# Patient Record
Sex: Male | Born: 1935 | Race: White | Hispanic: No | Marital: Married | Smoking: Former smoker
Health system: Southern US, Community
[De-identification: ages and names within clinical notes are randomized; demographics above are authoritative.]

## PROBLEM LIST (undated history)

## (undated) DIAGNOSIS — R0600 Dyspnea, unspecified: Secondary | ICD-10-CM

## (undated) DIAGNOSIS — L719 Rosacea, unspecified: Secondary | ICD-10-CM

## (undated) DIAGNOSIS — R0609 Other forms of dyspnea: Secondary | ICD-10-CM

## (undated) DIAGNOSIS — E785 Hyperlipidemia, unspecified: Secondary | ICD-10-CM

## (undated) DIAGNOSIS — Z86010 Personal history of colonic polyps: Secondary | ICD-10-CM

## (undated) DIAGNOSIS — N320 Bladder-neck obstruction: Secondary | ICD-10-CM

## (undated) DIAGNOSIS — Z972 Presence of dental prosthetic device (complete) (partial): Secondary | ICD-10-CM

## (undated) DIAGNOSIS — N400 Enlarged prostate without lower urinary tract symptoms: Secondary | ICD-10-CM

## (undated) DIAGNOSIS — I4819 Other persistent atrial fibrillation: Secondary | ICD-10-CM

## (undated) DIAGNOSIS — Z87898 Personal history of other specified conditions: Secondary | ICD-10-CM

## (undated) DIAGNOSIS — Z8719 Personal history of other diseases of the digestive system: Secondary | ICD-10-CM

## (undated) DIAGNOSIS — K5730 Diverticulosis of large intestine without perforation or abscess without bleeding: Secondary | ICD-10-CM

## (undated) DIAGNOSIS — I5032 Chronic diastolic (congestive) heart failure: Secondary | ICD-10-CM

## (undated) DIAGNOSIS — M1990 Unspecified osteoarthritis, unspecified site: Secondary | ICD-10-CM

## (undated) DIAGNOSIS — I10 Essential (primary) hypertension: Secondary | ICD-10-CM

## (undated) DIAGNOSIS — K219 Gastro-esophageal reflux disease without esophagitis: Secondary | ICD-10-CM

## (undated) DIAGNOSIS — R7301 Impaired fasting glucose: Secondary | ICD-10-CM

## (undated) DIAGNOSIS — Z973 Presence of spectacles and contact lenses: Secondary | ICD-10-CM

## (undated) DIAGNOSIS — L570 Actinic keratosis: Secondary | ICD-10-CM

## (undated) DIAGNOSIS — H3530 Unspecified macular degeneration: Secondary | ICD-10-CM

## (undated) DIAGNOSIS — K5792 Diverticulitis of intestine, part unspecified, without perforation or abscess without bleeding: Secondary | ICD-10-CM

## (undated) DIAGNOSIS — K222 Esophageal obstruction: Secondary | ICD-10-CM

## (undated) DIAGNOSIS — R918 Other nonspecific abnormal finding of lung field: Secondary | ICD-10-CM

## (undated) DIAGNOSIS — Z9889 Other specified postprocedural states: Secondary | ICD-10-CM

## (undated) DIAGNOSIS — N401 Enlarged prostate with lower urinary tract symptoms: Secondary | ICD-10-CM

## (undated) DIAGNOSIS — R42 Dizziness and giddiness: Secondary | ICD-10-CM

## (undated) DIAGNOSIS — N529 Male erectile dysfunction, unspecified: Secondary | ICD-10-CM

## (undated) DIAGNOSIS — C61 Malignant neoplasm of prostate: Secondary | ICD-10-CM

## (undated) DIAGNOSIS — Z85828 Personal history of other malignant neoplasm of skin: Secondary | ICD-10-CM

## (undated) HISTORY — DX: Esophageal obstruction: K22.2

## (undated) HISTORY — DX: Chronic diastolic (congestive) heart failure: I50.32

## (undated) HISTORY — DX: Diverticulitis of intestine, part unspecified, without perforation or abscess without bleeding: K57.92

## (undated) HISTORY — PX: NM MYOVIEW LTD: HXRAD82

## (undated) HISTORY — DX: Unspecified osteoarthritis, unspecified site: M19.90

## (undated) HISTORY — PX: COLONOSCOPY: SHX174

## (undated) HISTORY — DX: Hyperlipidemia, unspecified: E78.5

## (undated) HISTORY — DX: Essential (primary) hypertension: I10

## (undated) HISTORY — PX: ESOPHAGOGASTRODUODENOSCOPY: SHX1529

## (undated) HISTORY — DX: Benign prostatic hyperplasia without lower urinary tract symptoms: N40.0

## (undated) HISTORY — DX: Male erectile dysfunction, unspecified: N52.9

## (undated) HISTORY — DX: Other persistent atrial fibrillation: I48.19

## (undated) HISTORY — DX: Rosacea, unspecified: L71.9

## (undated) HISTORY — DX: Actinic keratosis: L57.0

## (undated) HISTORY — PX: CATARACT EXTRACTION W/ INTRAOCULAR LENS  IMPLANT, BILATERAL: SHX1307

## (undated) HISTORY — DX: Impaired fasting glucose: R73.01

## (undated) HISTORY — PX: REFRACTIVE SURGERY: SHX103

## (undated) HISTORY — DX: Personal history of colonic polyps: Z86.010

---
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
## 1982-03-27 HISTORY — PX: PATELLA FRACTURE SURGERY: SHX735

## 2004-05-17 ENCOUNTER — Ambulatory Visit: Payer: Self-pay | Admitting: Internal Medicine

## 2004-11-17 ENCOUNTER — Ambulatory Visit: Payer: Self-pay | Admitting: Internal Medicine

## 2004-11-22 ENCOUNTER — Ambulatory Visit: Payer: Self-pay

## 2005-01-26 ENCOUNTER — Ambulatory Visit: Payer: Self-pay | Admitting: Internal Medicine

## 2005-05-11 ENCOUNTER — Ambulatory Visit: Payer: Self-pay | Admitting: Internal Medicine

## 2005-05-22 ENCOUNTER — Ambulatory Visit: Payer: Self-pay | Admitting: Internal Medicine

## 2005-11-20 ENCOUNTER — Ambulatory Visit: Payer: Self-pay | Admitting: Internal Medicine

## 2006-02-06 ENCOUNTER — Ambulatory Visit: Payer: Self-pay | Admitting: Internal Medicine

## 2006-05-23 ENCOUNTER — Ambulatory Visit: Payer: Self-pay | Admitting: Internal Medicine

## 2006-05-23 LAB — CONVERTED CEMR LAB
ALT: 21 units/L (ref 0–40)
BUN: 23 mg/dL (ref 6–23)
Calcium: 9.6 mg/dL (ref 8.4–10.5)
Cholesterol: 197 mg/dL (ref 0–200)
GFR calc Af Amer: 96 mL/min
LDL Cholesterol: 122 mg/dL — ABNORMAL HIGH (ref 0–99)
Potassium: 5.2 meq/L — ABNORMAL HIGH (ref 3.5–5.1)
VLDL: 26 mg/dL (ref 0–40)

## 2006-11-12 DIAGNOSIS — F528 Other sexual dysfunction not due to a substance or known physiological condition: Secondary | ICD-10-CM | POA: Insufficient documentation

## 2006-11-12 DIAGNOSIS — N400 Enlarged prostate without lower urinary tract symptoms: Secondary | ICD-10-CM | POA: Insufficient documentation

## 2006-11-12 DIAGNOSIS — K5730 Diverticulosis of large intestine without perforation or abscess without bleeding: Secondary | ICD-10-CM | POA: Insufficient documentation

## 2006-11-12 DIAGNOSIS — I10 Essential (primary) hypertension: Secondary | ICD-10-CM | POA: Insufficient documentation

## 2006-11-12 DIAGNOSIS — E785 Hyperlipidemia, unspecified: Secondary | ICD-10-CM | POA: Insufficient documentation

## 2006-11-16 DIAGNOSIS — K222 Esophageal obstruction: Secondary | ICD-10-CM | POA: Insufficient documentation

## 2006-11-22 ENCOUNTER — Ambulatory Visit: Payer: Self-pay | Admitting: Internal Medicine

## 2006-11-22 DIAGNOSIS — M21619 Bunion of unspecified foot: Secondary | ICD-10-CM | POA: Insufficient documentation

## 2006-12-03 LAB — CONVERTED CEMR LAB
CO2: 31 meq/L (ref 19–32)
Calcium: 9.9 mg/dL (ref 8.4–10.5)
Chloride: 102 meq/L (ref 96–112)
Cholesterol: 232 mg/dL (ref 0–200)
Direct LDL: 146 mg/dL
Glucose, Bld: 102 mg/dL — ABNORMAL HIGH (ref 70–99)
PSA: 1.19 ng/mL (ref 0.10–4.00)

## 2007-01-18 ENCOUNTER — Ambulatory Visit: Payer: Self-pay | Admitting: Internal Medicine

## 2007-05-01 ENCOUNTER — Ambulatory Visit: Payer: Self-pay | Admitting: Internal Medicine

## 2007-05-02 LAB — CONVERTED CEMR LAB
ALT: 25 units/L (ref 0–53)
BUN: 23 mg/dL (ref 6–23)
CO2: 31 meq/L (ref 19–32)
Creatinine, Ser: 1.1 mg/dL (ref 0.4–1.5)
HDL: 45.9 mg/dL (ref >39.0)
Phosphorus: 4 mg/dL (ref 2.3–4.6)
Sodium: 140 meq/L (ref 135–145)
Triglycerides: 148 mg/dL (ref 0–149)

## 2007-05-20 ENCOUNTER — Ambulatory Visit: Payer: Self-pay | Admitting: Internal Medicine

## 2007-05-23 ENCOUNTER — Encounter: Payer: Self-pay | Admitting: Internal Medicine

## 2007-05-23 ENCOUNTER — Ambulatory Visit: Payer: Self-pay | Admitting: Internal Medicine

## 2007-05-23 DIAGNOSIS — Z860100 Personal history of colon polyps, unspecified: Secondary | ICD-10-CM

## 2007-05-23 HISTORY — DX: Personal history of colonic polyps: Z86.010

## 2007-05-23 HISTORY — DX: Personal history of colon polyps, unspecified: Z86.0100

## 2007-10-29 ENCOUNTER — Ambulatory Visit: Payer: Self-pay | Admitting: Internal Medicine

## 2007-10-29 DIAGNOSIS — M1990 Unspecified osteoarthritis, unspecified site: Secondary | ICD-10-CM | POA: Insufficient documentation

## 2007-10-29 DIAGNOSIS — R7301 Impaired fasting glucose: Secondary | ICD-10-CM | POA: Insufficient documentation

## 2007-10-30 LAB — CONVERTED CEMR LAB
ALT: 23 units/L (ref 0–53)
BUN: 24 mg/dL — ABNORMAL HIGH (ref 6–23)
Basophils Absolute: 0 10*3/uL (ref 0.0–0.1)
Bilirubin, Direct: 0.1 mg/dL (ref 0.0–0.3)
CO2: 30 meq/L (ref 19–32)
Chloride: 105 meq/L (ref 96–112)
Cholesterol: 179 mg/dL (ref 0–200)
Eosinophils Absolute: 0.1 10*3/uL (ref 0.0–0.7)
Glucose, Bld: 110 mg/dL — ABNORMAL HIGH (ref 70–99)
HCT: 42.8 % (ref 39.0–52.0)
HDL: 44.9 mg/dL (ref >39.0)
Hgb A1c MFr Bld: 5.7 % (ref 4.6–6.0)
MCHC: 34.7 g/dL (ref 30.0–36.0)
Monocytes Absolute: 0.4 10*3/uL (ref 0.1–1.0)
Monocytes Relative: 6.7 % (ref 3.0–12.0)
PSA: 0.98 ng/mL (ref 0.10–4.00)
Platelets: 197 10*3/uL (ref 150–400)
Potassium: 5.2 meq/L — ABNORMAL HIGH (ref 3.5–5.1)
RDW: 12.5 % (ref 11.5–14.6)
Sodium: 141 meq/L (ref 135–145)
TSH: 1.7 microintl units/mL (ref 0.35–5.50)
Total Bilirubin: 1.1 mg/dL (ref 0.3–1.2)
Total CHOL/HDL Ratio: 4
Triglycerides: 96 mg/dL (ref 0–149)

## 2007-12-30 ENCOUNTER — Telehealth (INDEPENDENT_AMBULATORY_CARE_PROVIDER_SITE_OTHER): Payer: Self-pay | Admitting: *Deleted

## 2008-05-08 ENCOUNTER — Ambulatory Visit: Payer: Self-pay | Admitting: Internal Medicine

## 2008-05-11 LAB — CONVERTED CEMR LAB
ALT: 27 units/L (ref 0–53)
AST: 25 units/L (ref 0–37)
Albumin: 4.1 g/dL (ref 3.5–5.2)
Alkaline Phosphatase: 41 units/L (ref 39–117)
CO2: 30 meq/L (ref 19–32)
Calcium: 9.8 mg/dL (ref 8.4–10.5)
Creatinine, Ser: 1 mg/dL (ref 0.4–1.5)
GFR calc non Af Amer: 79 mL/min
Glucose, Bld: 108 mg/dL — ABNORMAL HIGH (ref 70–99)
HDL: 49.1 mg/dL (ref >39.0)
Phosphorus: 4.1 mg/dL (ref 2.3–4.6)
Sodium: 141 meq/L (ref 135–145)
Triglycerides: 70 mg/dL (ref 0–149)

## 2008-08-17 ENCOUNTER — Telehealth: Payer: Self-pay | Admitting: Internal Medicine

## 2008-11-10 ENCOUNTER — Ambulatory Visit: Payer: Self-pay | Admitting: Internal Medicine

## 2008-11-11 LAB — CONVERTED CEMR LAB
Alkaline Phosphatase: 42 units/L (ref 39–117)
BUN: 21 mg/dL (ref 6–23)
Basophils Absolute: 0 10*3/uL (ref 0.0–0.1)
Bilirubin, Direct: 0.2 mg/dL (ref 0.0–0.3)
Calcium: 9.2 mg/dL (ref 8.4–10.5)
Chloride: 109 meq/L (ref 96–112)
Cholesterol: 167 mg/dL (ref 0–200)
Creatinine, Ser: 1 mg/dL (ref 0.4–1.5)
Eosinophils Absolute: 0.1 10*3/uL (ref 0.0–0.7)
HCT: 44.1 % (ref 39.0–52.0)
Hgb A1c MFr Bld: 5.5 % (ref 4.6–6.5)
LDL Cholesterol: 103 mg/dL — ABNORMAL HIGH (ref 0–99)
Lymphs Abs: 1.6 10*3/uL (ref 0.7–4.0)
MCHC: 34 g/dL (ref 30.0–36.0)
MCV: 96.9 fL (ref 78.0–100.0)
Monocytes Absolute: 0.4 10*3/uL (ref 0.1–1.0)
Neutrophils Relative %: 64.5 % (ref 43.0–77.0)
Platelets: 209 10*3/uL (ref 150.0–400.0)
Potassium: 5.1 meq/L (ref 3.5–5.1)
RDW: 12.9 % (ref 11.5–14.6)
TSH: 2 microintl units/mL (ref 0.35–5.50)
Total Bilirubin: 1.1 mg/dL (ref 0.3–1.2)
Total Protein: 6.6 g/dL (ref 6.0–8.3)
VLDL: 17.8 mg/dL (ref 0.0–40.0)

## 2009-05-14 ENCOUNTER — Ambulatory Visit: Payer: Self-pay | Admitting: Internal Medicine

## 2009-11-12 ENCOUNTER — Ambulatory Visit: Payer: Self-pay | Admitting: Internal Medicine

## 2009-11-15 LAB — CONVERTED CEMR LAB
AST: 26 units/L (ref 0–37)
Albumin: 4 g/dL (ref 3.5–5.2)
Alkaline Phosphatase: 37 units/L — ABNORMAL LOW (ref 39–117)
BUN: 25 mg/dL — ABNORMAL HIGH (ref 6–23)
Basophils Absolute: 0 10*3/uL (ref 0.0–0.1)
Basophils Relative: 0.4 % (ref 0.0–3.0)
Bilirubin, Direct: 0.1 mg/dL (ref 0.0–0.3)
Calcium: 9.4 mg/dL (ref 8.4–10.5)
Cholesterol: 176 mg/dL (ref 0–200)
Creatinine, Ser: 1 mg/dL (ref 0.4–1.5)
Eosinophils Absolute: 0.1 10*3/uL (ref 0.0–0.7)
Glucose, Bld: 103 mg/dL — ABNORMAL HIGH (ref 70–99)
LDL Cholesterol: 107 mg/dL — ABNORMAL HIGH (ref 0–99)
MCHC: 34.8 g/dL (ref 30.0–36.0)
MCV: 96.7 fL (ref 78.0–100.0)
Monocytes Absolute: 0.3 10*3/uL (ref 0.1–1.0)
Neutro Abs: 3.1 10*3/uL (ref 1.4–7.7)
Neutrophils Relative %: 59.3 % (ref 43.0–77.0)
Phosphorus: 3.2 mg/dL (ref 2.3–4.6)
Potassium: 4.9 meq/L (ref 3.5–5.1)
RBC: 4.45 M/uL (ref 4.22–5.81)
RDW: 14 % (ref 11.5–14.6)
Total CHOL/HDL Ratio: 4
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
## 2010-04-26 NOTE — Assessment & Plan Note (Signed)
Summary: 6 m f/u dlo   Vital Signs:  Patient profile:   75 year old male Weight:      197 pounds BMI:     30.06 Temp:     97.9 degrees F oral Pulse rate:   56 / minute Pulse rhythm:   regular BP sitting:   140 / 60  (left arm) Cuff size:   large  Vitals Entered By: Mervin Hack CMA Duncan Dull) (November 12, 2009 8:03 AM) CC: 6 month follow-up   History of Present Illness: DOing well Notes that he "runs out of energy" by the end of the day Feels his stamina is not as good as it used to Not unitl 4-5PM--better with rest No chest pain No sig SOB Tries to walk periodically--busy mowing lawns, etc  having some left arm pain Decreased internal rotation going on for some time Hasn't used meds  stomach has been fine no sig heartburn  Voiding better sometimes doesn't have any nocturia satisfied with the viagra  no myalgias with statin   Allergies: No Known Drug Allergies  Past History:  Past medical, surgical, family and social histories (including risk factors) reviewed for relevance to current acute and chronic problems.  Past Medical History: Reviewed history from 10/29/2007 and no changes required. Diverticulosis, colon Hyperlipidemia Hypertension Benign prostatic hypertrophy Schatski's ring/Grade 1 varices Erectile dysfunction Impaired fasting glucose Osteoarthritis  Past Surgical History: Reviewed history from 11/12/2006 and no changes required. Fx. right knee- pin placed 1984 EGD 10/02 Laser tx - right eye Exercise myoview - EF 63%, no sig reversible defect 08/06  Family History: Reviewed history from 11/12/2006 and no changes required. Dad died @82  of CHF Mom died of Alzheimer's 1 brother with DM  Social History: Reviewed history from 11/22/2006 and no changes required. 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   Neck:  supple, no masses, no thyromegaly, no carotid bruits, and no cervical lymphadenopathy.   Lungs:  normal respiratory effort, no intercostal retractions, no accessory muscle use, and normal breath sounds.   Heart:  normal rate, regular rhythm, no murmur, and no gallop.   Abdomen:  soft, non-tender, and no masses.   Msk:  no joint tenderness and no joint swelling.   Fairly normal passive ROM at left shoulder and no tenderness Extremities:  no edema Psych:  normally interactive, good eye contact, not anxious appearing, and not depressed appearing.     Impression & Recommendations:  Problem # 1:  HYPERTENSION (ICD-401.9) Assessment Unchanged  good control no changes  His updated medication list for this problem includes:    Bisoprolol-hydrochlorothiazide 10-6.25 Mg Tabs (Bisoprolol-hydrochlorothiazide) .Marland Kitchen... Take one by mouth once a day  BP today: 140/60 Prior BP: 158/80 (05/14/2009)  Labs Reviewed: K+: 5.1 (11/10/2008) Creat: : 1.0 (11/10/2008)   Chol: 167 (11/10/2008)   HDL: 46.60 (11/10/2008)   LDL: 103 (11/10/2008)   TG: 89.0 (11/10/2008)  Orders: TLB-Renal Function Panel (80069-RENAL) TLB-CBC Platelet - w/Differential (85025-CBCD) TLB-TSH (Thyroid Stimulating Hormone) (84443-TSH) Venipuncture (52841)  Problem # 2:  HYPERLIPIDEMIA (ICD-272.4) Assessment: Unchanged  no problems with med due for labs  His updated medication list for this problem includes:    Pravachol 40 Mg Tabs (Pravastatin sodium) .Marland Kitchen... Take one by mouth once a day  Labs Reviewed: SGOT: 24 (11/10/2008)   SGPT: 25 (11/10/2008)  HDL:46.60 (11/10/2008), 49.1 (05/08/2008)  LDL:103 (11/10/2008), 129 (05/08/2008)  Chol:167 (11/10/2008), 192 (05/08/2008)  Trig:89.0 (11/10/2008), 70 (05/08/2008)  Orders: TLB-Lipid Panel (80061-LIPID) TLB-Hepatic/Liver Function Pnl (80076-HEPATIC)  Problem # 3:  BENIGN PROSTATIC HYPERTROPHY  (ICD-600.00) Assessment: Unchanged voiding has improved actually will check PSA yearly till age 72  Problem # 4:  OSTEOARTHRITIS (ICD-715.90) Assessment: Unchanged mild symptoms only  Complete Medication List: 1)  Pravachol 40 Mg Tabs (Pravastatin sodium) .... Take one by mouth once a day 2)  Bisoprolol-hydrochlorothiazide 10-6.25 Mg Tabs (Bisoprolol-hydrochlorothiazide) .... Take one by mouth once a day 3)  Aspirin 81 Mg Tbec (Aspirin) .... Take one by mouth once a day 4)  Multivitamins Tabs (Multiple vitamin) .... Take one by mouth once a day 5)  Viagra 100 Mg Tabs (Sildenafil citrate) .... As needed 6)  Prilosec 40 Mg Cpdr (Omeprazole) .... Take one tablet daily  Other Orders: TLB-A1C / Hgb A1C (Glycohemoglobin) (83036-A1C) TLB-PSA (Prostate Specific Antigen) (84153-PSA)  Patient Instructions: 1)  Please schedule a follow-up appointment in 6 months .   Current Allergies (reviewed today): No known allergies

## 2010-05-17 ENCOUNTER — Ambulatory Visit (INDEPENDENT_AMBULATORY_CARE_PROVIDER_SITE_OTHER): Payer: Medicare Other | Admitting: Internal Medicine

## 2010-05-17 ENCOUNTER — Encounter: Payer: Self-pay | Admitting: Internal Medicine

## 2010-05-17 DIAGNOSIS — E785 Hyperlipidemia, unspecified: Secondary | ICD-10-CM

## 2010-05-17 DIAGNOSIS — K222 Esophageal obstruction: Secondary | ICD-10-CM

## 2010-05-17 DIAGNOSIS — I10 Essential (primary) hypertension: Secondary | ICD-10-CM

## 2010-05-17 DIAGNOSIS — M130 Polyarthritis, unspecified: Secondary | ICD-10-CM

## 2010-05-24 NOTE — Assessment & Plan Note (Signed)
Summary: 6 MONTH FOLLOW UP   Vital Signs:  Patient profile:   75 year old male Height:      68 inches Weight:      197.25 pounds BMI:     30.10 Temp:     97.6 degrees F oral Pulse rate:   60 / minute Pulse rhythm:   regular BP sitting:   146 / 88  (right arm) Cuff size:   large  Vitals Entered By: Linde Gillis CMA Duncan Dull) (May 17, 2010 7:56 AM) CC: 6 month follow up   History of Present Illness: Doing fairly well Still has soreness in left arm and shoulder Doesn't limit him but may have increased if he does yard work, etc Intermittent and generally doesn't need meds  Doesn't note the afternoon fatigue as much sleeps well mood is fine  No chest pain No SOB No edema No headaches  Voids okay---stream is slower or doesn't seem to empty completely Nocturia x 1 usually  Allergies (verified): No Known Drug Allergies  Past History:  Past medical, surgical, family and social histories (including risk factors) reviewed for relevance to current acute and chronic problems.  Past Medical History: Reviewed history from 10/29/2007 and no changes required. Diverticulosis, colon Hyperlipidemia Hypertension Benign prostatic hypertrophy Schatski's ring/Grade 1 varices Erectile dysfunction Impaired fasting glucose Osteoarthritis  Past Surgical History: Reviewed history from 11/12/2006 and no changes required. Fx. right knee- pin placed 1984 EGD 10/02 Laser tx - right eye Exercise myoview - EF 63%, no sig reversible defect 08/06  Family History: Reviewed history from 11/12/2006 and no changes required. Dad died @82  of CHF Mom died of Alzheimer's 1 brother with DM  Social History: Reviewed history from 11/22/2006 and no changes required. 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   Neck:  supple, no masses, no thyromegaly, no carotid bruits, and no cervical lymphadenopathy.   Lungs:  normal respiratory effort, no intercostal retractions, no accessory muscle use, and normal breath sounds.   Heart:  normal rate, regular rhythm, no murmur, and no gallop.   Abdomen:  soft and non-tender.   Msk:  no joint tenderness and no joint swelling.   Extremities:  no edema Psych:  normally interactive, good eye contact, not anxious appearing, and not depressed appearing.     Impression & Recommendations:  Problem # 1:  HYPERTENSION (ICD-401.9) Assessment Unchanged reasonable control no changes needed  His updated medication list for this problem includes:    Bisoprolol-hydrochlorothiazide 10-6.25 Mg Tabs (Bisoprolol-hydrochlorothiazide) .Marland Kitchen... Take one by mouth once a day  BP today: 146/88 Prior BP: 140/60 (11/12/2009)  Labs Reviewed: K+: 4.9 (11/12/2009) Creat: : 1.0 (11/12/2009)   Chol: 176 (11/12/2009)   HDL: 49.10 (11/12/2009)   LDL: 107 (11/12/2009)   TG: 102.0 (11/12/2009)  Problem # 2:  HYPERLIPIDEMIA (ICD-272.4) Assessment: Unchanged reasonable control no problems with the med  His updated medication list for this problem includes:    Pravachol 40 Mg Tabs (Pravastatin sodium) .Marland Kitchen... Take one by mouth once a day  Labs Reviewed: SGOT: 26 (11/12/2009)   SGPT: 25 (11/12/2009)   HDL:49.10 (11/12/2009), 46.60 (11/10/2008)  LDL:107 (11/12/2009), 103 (11/10/2008)  Chol:176 (11/12/2009), 167 (11/10/2008)  Trig:102.0 (11/12/2009), 89.0 (  11/10/2008)  Problem # 3:  SCHATZKI'S RING (ICD-530.3) Assessment: Unchanged very mild symptoms continues daily prilosec  Problem # 4:  UNSPEC POLYARTHROPATHY/POLYARTHRITIS SITE UNSPEC (ICD-716.50) Assessment: Unchanged mostly left shoulder hasn't needed meds  Problem # 5:  BENIGN PROSTATIC  HYPERTROPHY (ICD-600.00) Assessment: Unchanged voids okay  Complete Medication List: 1)  Pravachol 40 Mg Tabs (Pravastatin sodium) .... Take one by mouth once a day 2)  Bisoprolol-hydrochlorothiazide 10-6.25 Mg Tabs (Bisoprolol-hydrochlorothiazide) .... Take one by mouth once a day 3)  Aspirin 81 Mg Tbec (Aspirin) .... Take one by mouth once a day 4)  Multivitamins Tabs (Multiple vitamin) .... Take one by mouth once a day 5)  Viagra 100 Mg Tabs (Sildenafil citrate) .... As needed 6)  Prilosec 40 Mg Cpdr (Omeprazole) .... Take one tablet daily 7)  Metronidazole 0.75 % Gel (Metronidazole) .... Apply to face once daily  Patient Instructions: 1)  Please schedule a follow-up appointment in 6 months .    Orders Added: 1)  Est. Patient Level IV [16109]    Current Allergies (reviewed today): No known allergies

## 2010-05-30 ENCOUNTER — Telehealth: Payer: Self-pay | Admitting: Family Medicine

## 2010-06-07 NOTE — Progress Notes (Signed)
Summary: Rx Viagra  Phone Note Refill Request Call back at [phone] Message from:  [facility] on May 30, 2010 9:43 AM  Refills Requested: Medication #1:  VIAGRA 100 MG  TABS As needed   Last Refilled: 06/16/2009 Dr. Alphonsus Sias is out of the office this week.   Method Requested: Electronic Initial call taken by: Sydell Axon LPN,  May 30, 2010 9:44 AM  Follow-up for Phone Call        refilled. Follow-up by: Eustaquio Boyden  MD,  May 30, 2010 1:58 PM    Prescriptions: VIAGRA 100 MG  TABS (SILDENAFIL CITRATE) As needed  #6 x 5   Entered and Authorized by:   Eustaquio Boyden  MD   Signed by:   Janee Morn CMA (AAMA) on 05/30/2010   Method used:   [facility]        [id number]retail[facility]       [address] [place], [place]  [address]       Ph: [phone] or [phone]       Fax: [phone]   RxID:   [id number]

## 2010-06-24 ENCOUNTER — Other Ambulatory Visit: Payer: Self-pay | Admitting: *Deleted

## 2010-06-24 ENCOUNTER — Other Ambulatory Visit: Payer: Self-pay | Admitting: Internal Medicine

## 2010-06-24 MED ORDER — BISOPROLOL-HYDROCHLOROTHIAZIDE 10-6.25 MG PO TABS: 1.0000 | ORAL_TABLET | Freq: Every day | ORAL | Status: DC

## 2010-06-24 NOTE — Telephone Encounter (Signed)
rx sent to pharmacy

## 2010-07-27 ENCOUNTER — Other Ambulatory Visit: Payer: Self-pay | Admitting: Internal Medicine

## 2010-08-09 NOTE — Assessment & Plan Note (Signed)
[facility]                         GASTROENTEROLOGY OFFICE NOTE   Zachary Green, Zachary Green                      MRN:          [id number]  DATE:05/20/2007                            DOB:          07-09-1935    CHIEF COMPLAINT:  GERD problems.  Question need for colonoscopy again.   ASSESSMENT:  This is a 75 year old white man who has a history of  esophageal ring, question of esophageal varices, and gastroesophageal  reflux disease.  He had a Schatzki's ring and grade 1 varices at  endoscopy in 2002 and Dr. Maryruth Bun though maybe he had some liver disease.  He quit drinking at that time.  It is not clear to me if these were  truly varices from the records I see.  Sometimes that can be a little  tricky.  He does have increasing belching, and there may be some very  vague dysphagia, though probably not.  He is also due for a screening  colonoscopy, as it has been about 10 years since his last colonoscopy  (no polyps).   PLAN:  1. Schedule EGD to reassess, possibly dilate the esophagus, though      seems unlikely given the overall symptom complex, but it would be      appropriate to make sure eh does not really have varices.  2. Schedule screening colonoscopy.   HISTORY:  As above.  See medical history form.   MEDICATIONS:  Aspirin, Pravastatin, and multivitamins and bisoprolol  hydrochlorothiazide.   ALLERGIES:  THERE AER NO KNOWN DRUG ALLERGIES.   PAST MEDICAL HISTORY:  Diverticulosis, dyslipidemia, hypertension,  benign prostatic hypertrophy, lower esophageal ring, grade 1 esophageal  varices, erectile dysfunction, and glucose intolerance.  Right knee  surgery in 1984.  Laser therapy to the right eye.   FAMILY HISTORY:  Brother had diabetes.  No colon cancer, though brother  might have colon polyps.   SOCIAL HISTORY:  He is married.  He is a retired Scientist, water quality.  Spends  time at [facility] around [place].  No alcohol, tobacco, or  drugs.   REVIEW OF SYSTEMS:  Contact lenses, otherwise entirely negative.   PHYSICAL EXAMINATION:  Reveals a well-developed, obese white man.  Height 5 feet 9 inches.  Weight 194 pounds.  Blood pressure 162/74.  Pulse 72.  EYES:  Anicteric.  ENT:  Normal oropharynx.  NECK:  Supple, no thyromegaly.  CHEST:  Clear.  HEART:  S1 and S2, no gallops.  ABDOMEN:  Soft, nontender, obese.  No organomegaly or mass.  RECTAL:  Deferred.  LYMPHATIC:  No neck or inguinal nodes.  EXTREMITIES:  No edema.  SKIN:  He has a very ruddy complexion in the face and extremities.  Multiple areas of  wrinkling and sun damage.  I do not see any stigmata  of chronic liver disease otherwise.  PSYCHIATRIC:  He is alert and oriented x3.   DATA REVIEWED:  I have a record of his previous endoscopy.  He also had  a medium hiatus hernia reported from January 03, 2001, as well as  information above.  He had edema and congestion  on a gastritis biopsy.   Office records from Dr. Shelda Altes office are noted as well.  I have  reviewed the electronic medical record.  He notes that his liver  chemistries have been normal over time.  It is not really entirely clear  to me that this man has cirrhosis or significant liver disease.     Iva Boop, MD,FACG  Electronically Signed    CEG/MedQ  DD: 05/20/2007  DT: 05/21/2007  Job #: [id number]   cc:   Karie Schwalbe, MD

## 2010-10-24 ENCOUNTER — Other Ambulatory Visit: Payer: Self-pay | Admitting: *Deleted

## 2010-10-24 MED ORDER — BISOPROLOL-HYDROCHLOROTHIAZIDE 10-6.25 MG PO TABS: 1.0000 | ORAL_TABLET | Freq: Every day | ORAL | Status: DC

## 2010-10-27 ENCOUNTER — Other Ambulatory Visit: Payer: Self-pay | Admitting: *Deleted

## 2010-10-27 MED ORDER — BISOPROLOL-HYDROCHLOROTHIAZIDE 10-6.25 MG PO TABS: 1.0000 | ORAL_TABLET | Freq: Every day | ORAL | Status: DC

## 2010-11-21 ENCOUNTER — Encounter: Payer: Self-pay | Admitting: Internal Medicine

## 2010-11-22 ENCOUNTER — Ambulatory Visit (INDEPENDENT_AMBULATORY_CARE_PROVIDER_SITE_OTHER): Payer: Medicare Other | Admitting: Internal Medicine

## 2010-11-22 ENCOUNTER — Encounter: Payer: Self-pay | Admitting: Internal Medicine

## 2010-11-22 VITALS — BP 157/70 | HR 43 | Temp 98.6°F | Ht 68.0 in | Wt 195.0 lb

## 2010-11-22 DIAGNOSIS — I10 Essential (primary) hypertension: Secondary | ICD-10-CM

## 2010-11-22 DIAGNOSIS — E785 Hyperlipidemia, unspecified: Secondary | ICD-10-CM

## 2010-11-22 DIAGNOSIS — F528 Other sexual dysfunction not due to a substance or known physiological condition: Secondary | ICD-10-CM

## 2010-11-22 DIAGNOSIS — N400 Enlarged prostate without lower urinary tract symptoms: Secondary | ICD-10-CM

## 2010-11-22 DIAGNOSIS — R7301 Impaired fasting glucose: Secondary | ICD-10-CM

## 2010-11-22 DIAGNOSIS — K222 Esophageal obstruction: Secondary | ICD-10-CM

## 2010-11-22 DIAGNOSIS — Z0000 Encounter for general adult medical examination without abnormal findings: Secondary | ICD-10-CM

## 2010-11-22 LAB — CBC WITH DIFFERENTIAL/PLATELET
Eosinophils Relative: 2.8 % (ref 0.0–5.0)
HCT: 44.2 % (ref 39.0–52.0)
Hemoglobin: 14.9 g/dL (ref 13.0–17.0)
Lymphs Abs: 1.9 10*3/uL (ref 0.7–4.0)
Monocytes Relative: 6.6 % (ref 3.0–12.0)
Neutro Abs: 2.7 10*3/uL (ref 1.4–7.7)
WBC: 5.1 10*3/uL (ref 4.5–10.5)

## 2010-11-22 LAB — HEPATIC FUNCTION PANEL
ALT: 23 U/L (ref 0–53)
AST: 25 U/L (ref 0–37)
Albumin: 4.2 g/dL (ref 3.5–5.2)
Alkaline Phosphatase: 43 U/L (ref 39–117)
Total Protein: 6.8 g/dL (ref 6.0–8.3)

## 2010-11-22 LAB — BASIC METABOLIC PANEL
Chloride: 103 mEq/L (ref 96–112)
GFR: 92.66 mL/min (ref >60.00)
Glucose, Bld: 113 mg/dL — ABNORMAL HIGH (ref 70–99)
Potassium: 5.1 mEq/L (ref 3.5–5.1)
Sodium: 140 mEq/L (ref 135–145)

## 2010-11-22 LAB — LIPID PANEL
LDL Cholesterol: 110 mg/dL — ABNORMAL HIGH (ref 0–99)
VLDL: 16.4 mg/dL (ref 0.0–40.0)

## 2010-11-22 NOTE — Assessment & Plan Note (Signed)
Still happy with viagra but not the cost

## 2010-11-22 NOTE — Assessment & Plan Note (Signed)
BP Readings from Last 3 Encounters:  11/22/10 157/70  05/17/10 146/88  11/12/09 140/60   Generally fair control No changes for now Check labs

## 2010-11-22 NOTE — Assessment & Plan Note (Signed)
GERD controlled with omeprazole No dysphagia

## 2010-11-22 NOTE — Assessment & Plan Note (Signed)
Voids okay since giving up sodas

## 2010-11-22 NOTE — Assessment & Plan Note (Signed)
Doing fine with med Lab Results  Component Value Date   LDLCALC 107* 11/12/2009   Fair control

## 2010-11-22 NOTE — Assessment & Plan Note (Signed)
I have personally reviewed the Medicare Annual Wellness questionnaire and have noted 1. The patient's medical and social history 2. Their use of alcohol, tobacco or illicit drugs 3. Their current medications and supplements 4. The patient's functional ability including ADL's, fall risks, home safety risks and hearing or visual             impairment. 5. Diet and physical activities 6. Evidence for depression or mood disorders  The patients weight, height, BMI and visual acuity have been recorded in the chart I have made referrals, counseling and provided education to the patient based review of the above and I have provided the pt with a written personalized care plan for preventive services.  I have provided you with a copy of your personalized plan for preventive services. Please take the time to review along with your updated medication list.  Mild hearing problems---not bad enough to go for evaluation by symptoms No imms or screening due counselling done

## 2010-11-22 NOTE — Progress Notes (Signed)
Subjective:    Patient ID: Zachary Green, male    DOB: [birth year], 75 y.o.   MRN: [id number]  HPI Here for Wellness visit Reviewed cancer screening and imms---all UTD Reviewed advanced directives  No chest pain No SOB No dizziness or fainting spells No edema  No trouble with statin No myalgias unless due to yard work No stomach trouble  No heartburn No swallowing problems Takes omeprazole daily----can tell by evening if he misses dose  Voids okay No frequency since giving up diet soda Nocturia x 1 only  Current Outpatient Prescriptions on File Prior to Visit  Medication Sig Dispense Refill  . aspirin 81 MG tablet Take 81 mg by mouth daily.        . bisoprolol-hydrochlorothiazide (ZIAC) 10-6.25 MG per tablet Take 1 tablet by mouth daily.  30 tablet  6  . Multiple Vitamin (MULTIVITAMIN) tablet Take 1 tablet by mouth daily.        Marland Kitchen omeprazole (PRILOSEC) 40 MG capsule Take 40 mg by mouth daily.        . pravastatin (PRAVACHOL) 40 MG tablet Take 40 mg by mouth daily.        . sildenafil (VIAGRA) 100 MG tablet Take 100 mg by mouth daily as needed.          No Known Allergies  Past Medical History  Diagnosis Date  . Diverticulitis   . Hyperlipidemia   . Hypertension   . BPH (benign prostatic hypertrophy)   . Schatzki's ring     grade 1 varices  . ED (erectile dysfunction)   . Impaired fasting glucose   . Arthritis     Past Surgical History  Procedure Date  . Patella fracture surgery     pin placed 1984  . Esophagogastroduodenoscopy   . Nm myoview ltd   . Refractive surgery     Family History  Problem Relation Age of Onset  . Alzheimer's disease Mother   . Diabetes Brother     History   Social History  . Marital Status: Married    Spouse Name: N/A    Number of Children: 4  . Years of Education: N/A   Occupational History  . retired Scientist, water quality    Social History Main Topics  . Smoking status: Former Games developer  . Smokeless tobacco: Never Used  .  Alcohol Use: Yes  . Drug Use: No  . Sexually Active: Not on file   Other Topics Concern  . Not on file   Social History Narrative   Has living willWife is [place] care [place] (then oldest son, Todd)Not sure about DNR---will accept resuscitation for nowProbably would not want a feeding tube   Review of Systems No depression Walks regularly---also busy with mowing yards ([place] and here) No falls and is stable Sleeps well--occ awakening at night and some trouble reinitiating (but not usually) Appetite is good --weight is stable    Objective:   Physical Exam  Constitutional: He is oriented to person, place, and time. He appears well-developed and well-nourished. No distress.  HENT:  Mouth/Throat: Oropharynx is clear and moist. No oropharyngeal exudate.  Neck: Neck supple. No thyromegaly present.       Fairly normal ROM in all spheres  Cardiovascular: Normal rate, regular rhythm, normal heart sounds and intact distal pulses.  Exam reveals no gallop.   No murmur heard. Pulmonary/Chest: Effort normal and breath sounds normal. No respiratory distress. He has no wheezes. He has no rales.  Abdominal:  Soft. There is no tenderness.  Musculoskeletal: Normal range of motion. He exhibits no edema and no tenderness.  Lymphadenopathy:    He has no cervical adenopathy.  Neurological: He is alert and oriented to person, place, and time. He exhibits normal muscle tone.       Normal strength and gait Presidents--"Obama, Bush, Bush's dad (Clinton after prompting)" [phone] D-l-r-[id number]-w Recall  2/3  Psychiatric: He has a normal mood and affect. His behavior is normal. Judgment and thought content normal.          Assessment & Plan:

## 2011-01-11 ENCOUNTER — Telehealth: Payer: Self-pay | Admitting: Internal Medicine

## 2011-01-11 NOTE — Telephone Encounter (Signed)
Pt was charge for a [employer] and Level. Per Dr. Alphonsus Sias notes charges for the Level should have been a no charges. FYI to [place] to review and advise...cdavis 01-11-2011

## 2011-01-12 NOTE — Telephone Encounter (Signed)
Please send to charge correction with [place]'s instructions.  It may have already been sent; however, please send again for them to adjust per his information.  Thanks, [employer]

## 2011-01-17 NOTE — Telephone Encounter (Signed)
Sent to charge correction...cdavis

## 2011-01-19 NOTE — Telephone Encounter (Signed)
Sp w/ pt aware charges will be waived...cdavis

## 2011-05-01 ENCOUNTER — Telehealth: Payer: Self-pay | Admitting: Internal Medicine

## 2011-05-01 NOTE — Telephone Encounter (Signed)
Pt concerned about a charge during his Wellness Visit on August 28th . Pt was charged for the Wellness Exam and  E/ M level 3. Per your notes their is a No charge listed. Did you mean to charge Zachary Green for the level and the Exam?

## 2011-05-01 NOTE — Telephone Encounter (Signed)
It was actually a [id number] plus the wellness visit Some other doctors had told me (erroneously) that you put no charge and then the E&M as a 25 modifier. This may be the only one I did like that  It is like most of my wellness visits----the wellness charge and the E&M for reviewing all his chronic medical issues at the same visit

## 2011-05-03 NOTE — Telephone Encounter (Signed)
Bo Merino and the Asa Lente is presently working on this chart regarding a charge...cdavis

## 2011-05-04 ENCOUNTER — Telehealth: Payer: Self-pay | Admitting: Internal Medicine

## 2011-05-04 NOTE — Telephone Encounter (Signed)
Called patient to discuss audit. Per Bo Merino charges of correct. No adjustment need to be done.  Called pt, he understood but still not happy.

## 2011-05-16 ENCOUNTER — Other Ambulatory Visit: Payer: Self-pay | Admitting: Internal Medicine

## 2011-05-24 ENCOUNTER — Ambulatory Visit: Payer: BLUE CROSS/BLUE SHIELD | Admitting: Internal Medicine

## 2011-06-19 ENCOUNTER — Other Ambulatory Visit: Payer: Self-pay | Admitting: Family Medicine

## 2011-06-19 NOTE — Telephone Encounter (Signed)
Ok to refill 

## 2011-06-20 NOTE — Telephone Encounter (Signed)
Rx sent electronically.  

## 2011-10-08 ENCOUNTER — Other Ambulatory Visit: Payer: Self-pay | Admitting: Internal Medicine

## 2011-12-05 ENCOUNTER — Encounter: Payer: BLUE CROSS/BLUE SHIELD | Admitting: Internal Medicine

## 2011-12-15 ENCOUNTER — Other Ambulatory Visit: Payer: Self-pay | Admitting: Internal Medicine

## 2012-05-10 ENCOUNTER — Encounter: Payer: Self-pay | Admitting: Internal Medicine

## 2012-05-13 ENCOUNTER — Encounter: Payer: Self-pay | Admitting: Internal Medicine

## 2012-12-17 ENCOUNTER — Ambulatory Visit: Payer: Self-pay | Admitting: Gastroenterology

## 2013-01-10 ENCOUNTER — Encounter: Payer: Self-pay | Admitting: Internal Medicine

## 2013-11-28 ENCOUNTER — Encounter: Payer: Self-pay | Admitting: Internal Medicine

## 2015-04-28 DIAGNOSIS — L719 Rosacea, unspecified: Secondary | ICD-10-CM | POA: Insufficient documentation

## 2015-08-11 ENCOUNTER — Encounter: Payer: Self-pay | Admitting: *Deleted

## 2015-08-11 ENCOUNTER — Other Ambulatory Visit: Payer: BLUE CROSS/BLUE SHIELD

## 2015-08-11 NOTE — Patient Instructions (Signed)
  Your procedure is scheduled on:08-17-15 (TUESDAY) Report to [facility] To find out your arrival time please call [phone] between 1PM - 3PM on 08-16-15 (MONDAY)  Remember: Instructions that are not followed completely may result in serious medical risk, up to and including death, or upon the discretion of your surgeon and anesthesiologist your surgery may need to be rescheduled.    _X___ 1. Do not eat food or drink liquids after midnight. No gum chewing or hard candies.     _X___ 2. No Alcohol for 24 hours before or after surgery.   ____ 3. Bring all medications with you on the day of surgery if instructed.    _X___ 4. Notify your doctor if there is any change in your medical condition     (cold, fever, infections).     Do not wear jewelry, make-up, hairpins, clips or nail polish.  Do not wear lotions, powders, or perfumes. You may wear deodorant.  Do not shave 48 hours prior to surgery. Men may shave face and neck.  Do not bring valuables to the hospital.    [facility] is not responsible for any belongings or valuables.               Contacts, dentures or bridgework may not be worn into surgery.  Leave your suitcase in the car. After surgery it may be brought to your room.  For patients admitted to the hospital, discharge time is determined by your treatment team.   Patients discharged the day of surgery will not be allowed to drive home.   Please read over the following fact sheets that you were given:      ____ Take these medicines the morning of surgery with A SIP OF WATER:    1. LOSARTAN  2. PRAVASTATIN  3. NEXIUM  4.TAKE AN EXTRA NEXIUM ON Monday NIGHT(07-29-20-17) BEFORE BED  5.  6.  ____ Fleet Enema (as directed)   ____ Use CHG Soap as directed  ____ Use inhalers on the day of surgery  ____ Stop metformin 2 days prior to surgery    ____ Take 1/2 of usual insulin dose the night before surgery and none on the morning of surgery.    ____ Stop Coumadin/Plavix/aspirin-N/A  _X___ Stop Anti-inflammatories-NO NSAIDS OR ASPIRIN PRODUCTS-TYLENOL OK TO TAKE   _X___ Stop supplements until after surgery-STOP PRESERVISION NOW  ____ Bring C-Pap to the hospital.

## 2015-08-12 NOTE — H&P (Signed)
NAME:  CYSON, COGAN NO.:  [id number]  MEDICAL RECORD NO.:  [id number]  LOCATION:                                 FACILITY:  PHYSICIAN:  Maryan Puls          DATE OF BIRTH:  [birth year]  DATE OF ADMISSION:  08/17/2015 DATE OF DISCHARGE:                            HISTORY AND PHYSICAL   Same-day surgery, May 23.  CHIEF COMPLAINT:  Difficulty voiding.  HISTORY:  Mr. Bota is a 80 year old white male, with a greater than 1 year history of progressively worsening lower urinary tract symptoms. At the present time, he complains of frequency, incomplete emptying, intermittency, weak stream straining, and nocturia.  AUA symptom score was 16 with a quality score was 4.  He underwent a uroflow study on April 12, and determined to be in urinary retention.  He was prescribed tamsulosin, but could not tolerate that due to orthostasis.  We converted him to alfuzosin 10 mg per day, but he continued to have significant urinary symptoms.  However, he has been able to void without need for a catheter.  He was also found to have elevated PSA and underwent ultrasound-guided biopsy of the prostate which indicated that he had a 44.6 g prostate.  Cystoscopy was performed on April 19, indicating a 4 cm prostatic urethral length.  He also had a bladder diverticulum present.  PSA was elevated at 5.7, which resulted in a biopsy of the prostate.  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  The patient comes in now for a photo vaporization of prostate with a GreenLight laser.  PAST MEDICAL HISTORY:  No drug allergies.  CURRENT MEDICATIONS: 1. HCTZ. 2. Losartan. 3. Pravastatin. 4. Aspirin. 5. Doxycycline. 6. Nexium. 7. Colace. 8. Multivitamins. 9. Metronidazole cream. 10.Alfuzosin.  PAST SURGICAL HISTORY:  Tonsillectomy in 1949 and repair of an injured right leg in 1989.  SOCIAL HISTORY:   The patient quit smoking in 1960 with a 6-pack year history.  Denies alcohol use.  FAMILY HISTORY:  Father died age 84 of heart attack, mother died at age 82 of dementia.  PAST AND CURRENT MEDICAL CONDITIONS: 1. Hypertension. 2. Hypercholesterolemia. 3. GERD. 4. Rosacea.  REVIEW OF SYSTEMS:  The patient has had some hearing loss and bruises easily.  No chest pain, shortness of breath, diabetes, stroke, or heart disease.  PHYSICAL EXAMINATION:  VITAL SIGNS:  On examination, height 5 feet 6 inches, weight 182. GENERAL:  Well-nourished white male, in no distress.  HEENT:  Sclerae were clear.  Pupils are equally round, reactive to light and accommodation.  Extraocular movements were intact. NECK:  Supple.  No palpable cervical adenopathy. LUNGS:  Clear to auscultation. CARDIOVASCULAR:  Regular rhythm rate without audible murmurs. ABDOMEN:  Soft, nontender abdomen. GU:  Uncircumcised testes, smooth and nontender.  16 mL size each. Rectal exam, 40 g smooth and nontender prostate. NEUROMUSCULAR:  Grossly intact.  Alert and oriented x3.  IMPRESSION:  Benign prostatic hypertrophy with urinary retention and outlet obstruction.  PLAN:  Photo vaporization of prostate with GreenLight laser.    ______________________________ Maryan Puls   ______________________________  Maryan Puls    MW/MEDQ  D:  08/12/2015  T:  08/12/2015  Job:  [id number]

## 2015-08-13 ENCOUNTER — Encounter
Admission: RE | Admit: 2015-08-13 | Discharge: 2015-08-13 | Disposition: A | Discharge status: Home / Self Care | Payer: Medicare Other | Source: Ambulatory Visit | Attending: Physical Medicine and Rehabilitation | Admitting: Physical Medicine and Rehabilitation

## 2015-08-13 DIAGNOSIS — Z01810 Encounter for preprocedural cardiovascular examination: Secondary | ICD-10-CM | POA: Insufficient documentation

## 2015-08-13 DIAGNOSIS — Z01812 Encounter for preprocedural laboratory examination: Secondary | ICD-10-CM | POA: Insufficient documentation

## 2015-08-13 LAB — POTASSIUM: Potassium: 3.9 mmol/L (ref 3.5–5.1)

## 2015-08-17 ENCOUNTER — Ambulatory Visit: Payer: Medicare Other | Admitting: Anesthesiology

## 2015-08-17 ENCOUNTER — Ambulatory Visit
Admission: RE | Admit: 2015-08-17 | Discharge: 2015-08-17 | Disposition: A | Discharge status: Home / Self Care | Payer: Medicare Other | Source: Ambulatory Visit | Attending: Urology | Admitting: Urology

## 2015-08-17 ENCOUNTER — Encounter
Admission: RE | Disposition: A | Discharge status: Home / Self Care | Payer: Self-pay | Source: Ambulatory Visit | Attending: Urology

## 2015-08-17 DIAGNOSIS — Z79899 Other long term (current) drug therapy: Secondary | ICD-10-CM | POA: Insufficient documentation

## 2015-08-17 DIAGNOSIS — N401 Enlarged prostate with lower urinary tract symptoms: Secondary | ICD-10-CM | POA: Diagnosis not present

## 2015-08-17 DIAGNOSIS — N138 Other obstructive and reflux uropathy: Secondary | ICD-10-CM | POA: Diagnosis not present

## 2015-08-17 DIAGNOSIS — R3912 Poor urinary stream: Secondary | ICD-10-CM | POA: Insufficient documentation

## 2015-08-17 DIAGNOSIS — I10 Essential (primary) hypertension: Secondary | ICD-10-CM | POA: Diagnosis not present

## 2015-08-17 DIAGNOSIS — R3914 Feeling of incomplete bladder emptying: Secondary | ICD-10-CM | POA: Insufficient documentation

## 2015-08-17 DIAGNOSIS — R351 Nocturia: Secondary | ICD-10-CM | POA: Insufficient documentation

## 2015-08-17 DIAGNOSIS — Z87891 Personal history of nicotine dependence: Secondary | ICD-10-CM | POA: Diagnosis not present

## 2015-08-17 DIAGNOSIS — K219 Gastro-esophageal reflux disease without esophagitis: Secondary | ICD-10-CM | POA: Diagnosis not present

## 2015-08-17 DIAGNOSIS — E7800 Pure hypercholesterolemia, unspecified: Secondary | ICD-10-CM | POA: Diagnosis not present

## 2015-08-17 DIAGNOSIS — R338 Other retention of urine: Secondary | ICD-10-CM | POA: Insufficient documentation

## 2015-08-17 DIAGNOSIS — R350 Frequency of micturition: Secondary | ICD-10-CM | POA: Insufficient documentation

## 2015-08-17 DIAGNOSIS — R339 Retention of urine, unspecified: Secondary | ICD-10-CM | POA: Diagnosis present

## 2015-08-17 DIAGNOSIS — L719 Rosacea, unspecified: Secondary | ICD-10-CM | POA: Diagnosis not present

## 2015-08-17 DIAGNOSIS — H9190 Unspecified hearing loss, unspecified ear: Secondary | ICD-10-CM | POA: Diagnosis not present

## 2015-08-17 HISTORY — PX: GREEN LIGHT LASER TURP (TRANSURETHRAL RESECTION OF PROSTATE: SHX6260

## 2015-08-17 HISTORY — DX: Gastro-esophageal reflux disease without esophagitis: K21.9

## 2015-08-17 SURGERY — GREEN LIGHT LASER TURP (TRANSURETHRAL RESECTION OF PROSTATE
Anesthesia: General

## 2015-08-17 MED ORDER — PROPOFOL 10 MG/ML IV BOLUS
INTRAVENOUS | Status: DC | PRN
  Administered 2015-08-17: 30 mg via INTRAVENOUS
  Administered 2015-08-17: 130 mg via INTRAVENOUS
  Administered 2015-08-17: 40 mg via INTRAVENOUS

## 2015-08-17 MED ORDER — LIDOCAINE HCL 2 % EX GEL
CUTANEOUS | Status: DC | PRN
  Administered 2015-08-17: 1

## 2015-08-17 MED ORDER — LACTATED RINGERS IV SOLN
INTRAVENOUS | Status: DC
  Administered 2015-08-17 (×2): via INTRAVENOUS

## 2015-08-17 MED ORDER — DOCUSATE SODIUM 100 MG PO CAPS: 200.0000 mg | ORAL_CAPSULE | Freq: Two times a day (BID) | ORAL | Status: DC

## 2015-08-17 MED ORDER — LEVOFLOXACIN IN D5W 500 MG/100ML IV SOLN
500.0000 mg | Freq: Once | INTRAVENOUS | Status: AC
  Administered 2015-08-17: 500 mg via INTRAVENOUS

## 2015-08-17 MED ORDER — UROGESIC-BLUE 81.6 MG PO TABS: 1.0000 | ORAL_TABLET | Freq: Four times a day (QID) | ORAL | Status: DC | PRN

## 2015-08-17 MED ORDER — LEVOFLOXACIN 500 MG PO TABS: 500.0000 mg | ORAL_TABLET | Freq: Every day | ORAL | Status: DC

## 2015-08-17 MED ORDER — ONDANSETRON HCL 4 MG/2ML IJ SOLN: 4.0000 mg | Freq: Once | INTRAMUSCULAR | Status: DC | PRN

## 2015-08-17 MED ORDER — FENTANYL CITRATE (PF) 100 MCG/2ML IJ SOLN
INTRAMUSCULAR | Status: DC | PRN
  Administered 2015-08-17 (×3): 25 ug via INTRAVENOUS
  Administered 2015-08-17: 50 ug via INTRAVENOUS
  Administered 2015-08-17: 25 ug via INTRAVENOUS
  Administered 2015-08-17: 50 ug via INTRAVENOUS

## 2015-08-17 MED ORDER — LIDOCAINE HCL (CARDIAC) 20 MG/ML IV SOLN
INTRAVENOUS | Status: DC | PRN
  Administered 2015-08-17: 60 mg via INTRAVENOUS

## 2015-08-17 MED ORDER — LEVOFLOXACIN IN D5W 500 MG/100ML IV SOLN
INTRAVENOUS | Status: AC
  Administered 2015-08-17: 500 mg via INTRAVENOUS
  Filled 2015-08-17: qty 100

## 2015-08-17 MED ORDER — ACETAMINOPHEN-CODEINE #3 300-30 MG PO TABS
ORAL_TABLET | ORAL | Status: AC
  Administered 2015-08-17: 1 via ORAL
  Filled 2015-08-17: qty 1

## 2015-08-17 MED ORDER — ONDANSETRON HCL 4 MG/2ML IJ SOLN
INTRAMUSCULAR | Status: DC | PRN
  Administered 2015-08-17: 4 mg via INTRAVENOUS

## 2015-08-17 MED ORDER — EPHEDRINE SULFATE 50 MG/ML IJ SOLN
INTRAMUSCULAR | Status: DC | PRN
  Administered 2015-08-17 (×2): 10 mg via INTRAVENOUS

## 2015-08-17 MED ORDER — FENTANYL CITRATE (PF) 100 MCG/2ML IJ SOLN
25.0000 ug | INTRAMUSCULAR | Status: DC | PRN
  Administered 2015-08-17 (×2): 50 ug via INTRAVENOUS

## 2015-08-17 MED ORDER — BELLADONNA ALKALOIDS-OPIUM 16.2-60 MG RE SUPP
RECTAL | Status: DC | PRN
  Administered 2015-08-17: 1 via RECTAL

## 2015-08-17 MED ORDER — FENTANYL CITRATE (PF) 100 MCG/2ML IJ SOLN
INTRAMUSCULAR | Status: AC
  Administered 2015-08-17: 50 ug via INTRAVENOUS
  Filled 2015-08-17: qty 2

## 2015-08-17 MED ORDER — BELLADONNA ALKALOIDS-OPIUM 16.2-60 MG RE SUPP
RECTAL | Status: AC
  Filled 2015-08-17: qty 1

## 2015-08-17 MED ORDER — LIDOCAINE HCL 2 % EX GEL
CUTANEOUS | Status: AC
  Filled 2015-08-17: qty 10

## 2015-08-17 MED ORDER — ACETAMINOPHEN-CODEINE #3 300-30 MG PO TABS: 1.0000 | ORAL_TABLET | ORAL | Status: DC | PRN

## 2015-08-17 MED ORDER — ACETAMINOPHEN-CODEINE #3 300-30 MG PO TABS
1.0000 | ORAL_TABLET | ORAL | Status: DC | PRN
  Administered 2015-08-17: 1 via ORAL

## 2015-08-17 SURGICAL SUPPLY — 30 items
ADAPTER IRRIG TUBE 2 SPIKE SOL (ADAPTER) ×4 IMPLANT
ADPR TBG 2 SPK PMP STRL ASCP (ADAPTER) ×2
BAG URO DRAIN 2000ML W/SPOUT (MISCELLANEOUS) ×2 IMPLANT
CATH FOL 2WAY LX 24X30 (CATHETERS) ×2 IMPLANT
CATH FOLEY 2WAY  5CC 20FR SIL (CATHETERS) ×1
CATH FOLEY 2WAY 5CC 20FR SIL (CATHETERS) ×1 IMPLANT
GLOVE BIO SURGEON STRL SZ7 (GLOVE) ×4 IMPLANT
GLOVE BIO SURGEON STRL SZ7.5 (GLOVE) ×2 IMPLANT
GOWN STRL REUS W/ TWL LRG LVL3 (GOWN DISPOSABLE) ×1 IMPLANT
GOWN STRL REUS W/ TWL XL LVL3 (GOWN DISPOSABLE) ×1 IMPLANT
GOWN STRL REUS W/TWL LRG LVL3 (GOWN DISPOSABLE) ×2
GOWN STRL REUS W/TWL XL LVL3 (GOWN DISPOSABLE) ×2
IV NS 1000ML (IV SOLUTION) ×2
IV NS 1000ML BAXH (IV SOLUTION) ×1 IMPLANT
IV SET PRIMARY 15D 139IN B9900 (IV SETS) ×2 IMPLANT
KIT RM TURNOVER CYSTO AR (KITS) ×2 IMPLANT
LASER GRNLGT 950 (MISCELLANEOUS) ×2 IMPLANT
LASER GRNLGT MOXY FIBER 750UM (MISCELLANEOUS) ×2 IMPLANT
PACK CYSTO AR (MISCELLANEOUS) ×2 IMPLANT
PREP PVP WINGED SPONGE (MISCELLANEOUS) ×2 IMPLANT
SET IRRIG Y TYPE TUR BLADDER L (SET/KITS/TRAYS/PACK) ×2 IMPLANT
SOL .9 NS 3000ML IRR  AL (IV SOLUTION) ×4
SOL .9 NS 3000ML IRR AL (IV SOLUTION) ×4
SOL .9 NS 3000ML IRR UROMATIC (IV SOLUTION) ×4 IMPLANT
SOL PREP PVP 2OZ (MISCELLANEOUS) ×2
SOLUTION PREP PVP 2OZ (MISCELLANEOUS) ×1 IMPLANT
SURGILUBE 2OZ TUBE FLIPTOP (MISCELLANEOUS) ×2 IMPLANT
SYRINGE IRR TOOMEY STRL 70CC (SYRINGE) ×2 IMPLANT
TUBING CONNECTING 10 (TUBING) ×2 IMPLANT
WATER STERILE IRR 1000ML POUR (IV SOLUTION) ×2 IMPLANT

## 2015-08-17 NOTE — Transfer of Care (Signed)
Immediate Anesthesia Transfer of Care Note  Patient: Zachary Green  Procedure(s) Performed: Procedure(s): GREEN LIGHT LASER TURP (TRANSURETHRAL RESECTION OF PROSTATE (N/A)  Patient Location: PACU  Anesthesia Type:General  Level of Consciousness: awake and patient cooperative  Airway & Oxygen Therapy: Patient Spontanous Breathing and Patient connected to face mask oxygen  Post-op Assessment: Report given to RN  Post vital signs: Reviewed and stable  Last Vitals:  Filed Vitals:   08/17/15 1200 08/17/15 1433  BP: 166/73 121/68  Pulse: 65 59  Temp: 36.8 C 36.4 C  Resp: 16 12    Last Pain: There were no vitals filed for this visit.       Complications: No apparent anesthesia complications

## 2015-08-17 NOTE — Discharge Instructions (Signed)
Benign Prostatic Hyperplasia An enlarged prostate (benign prostatic hyperplasia) is common in older men. You may experience the following:  Weak urine stream.  Dribbling.  Feeling like the bladder has not emptied completely.  Difficulty starting urination.  Getting up frequently at night to urinate.  Urinating more frequently during the day. HOME CARE INSTRUCTIONS  Monitor your prostatic hyperplasia for any changes. The following actions may help to alleviate any discomfort you are experiencing:  Give yourself time when you urinate.  Stay away from alcohol.  Avoid beverages containing caffeine, such as coffee, tea, and colas, because they can make the problem worse.  Avoid decongestants, antihistamines, and some prescription medicines that can make the problem worse.  Follow up with your health care provider for further treatment as recommended. SEEK MEDICAL CARE IF:  You are experiencing progressive difficulty voiding.  Your urine stream is progressively getting narrower.  You are awaking from sleep with the urge to void more frequently.  You are constantly feeling the need to void.  You experience loss of urine, especially in small amounts. SEEK IMMEDIATE MEDICAL CARE IF:   You develop increased pain with urination or are unable to urinate.  You develop severe abdominal pain, vomiting, a high fever, or fainting.  You develop back pain or blood in your urine. MAKE SURE YOU:   Understand these instructions.  Will watch your condition.  Will get help right away if you are not doing well or get worse.   This information is not intended to replace advice given to you by your health care provider. Make sure you discuss any questions you have with your health care provider.   Document Released: 03/13/2005 Document Revised: 04/03/2014 Document Reviewed: 08/13/2012 Elsevier Interactive Patient Education 2016 [employer]  Benign Prostatic Hypertrophy The prostate  gland is part of the reproductive system of men. A normal prostate is about the size and shape of a walnut. The prostate gland produces a fluid that is mixed with sperm to make semen. This gland surrounds the urethra and is located in front of the rectum and just below the bladder. The bladder is where urine is stored. The urethra is the tube through which urine passes from the bladder to get out of the body. The prostate grows as a man ages. An enlarged prostate not caused by cancer is called benign prostatic hypertrophy (BPH). An enlarged prostate can press on the urethra. This can make it harder to pass urine. In the early stages of enlargement, the bladder can get by with a narrowed urethra by forcing the urine through. If the problem gets worse, medical or surgical treatment may be required.  This condition should be followed by your health care provider. The accumulation of urine in the bladder can cause infection. Back pressure and infection can progress to bladder damage and kidney (renal) failure. If needed, your health care provider may refer you to a specialist in kidney and prostate disease (urologist). CAUSES  BPH is a common health problem in men older than 50 years. This condition is a normal part of aging. However, not all men will develop problems from this condition. If the enlargement grows away from the urethra, then there will not be any compression of the urethra and resistance to urine flow.If the growth is toward the urethra and compresses it, you will experience difficulty urinating.  SYMPTOMS   Not able to completely empty your bladder.  Getting up often during the night to urinate.  Need to urinate frequently  during the day.  Difficultly starting urine flow.  Decrease in size and strength of your urine stream.  Dribbling after urination.  Pain on urination (more common with infection).  Inability to pass urine. This needs immediate treatment.  The development of a  urinary tract infection. DIAGNOSIS  These tests will help your health care provider understand your problem:  A thorough history and physical examination.  A urination history, with the number of times you urinate, the amounts of urine, the strength of the urine stream, and the feeling of emptiness or fullness after urinating.  A postvoid bladder scan that measures any amount of urine that may remain in your bladder after you finish urinating.  Digital rectal exam. In a rectal exam, your health care provider checks your prostate by putting a gloved, lubricated finger into your rectum to feel the back of your prostate gland. This exam detects the size of your gland and abnormal lumps or growths.  Exam of your urine (urinalysis).  Prostate specific antigen (PSA) screening. This is a blood test used to screen for prostate cancer.  Rectal ultrasonography. This test uses sound waves to electronically produce a picture of your prostate gland. TREATMENT  Once symptoms begin, your health care provider will monitor your condition. Of the men with this condition, one third will have symptoms that stabilize, one third will have symptoms that improve, and one third will have symptoms that progress in the first year. Mild symptoms may not need treatment. Simple observation and yearly exams may be all that is required. Medicines and surgery are options for more severe problems. Your health care provider can help you make an informed decision for what is best. Two classes of medicines are available for relief of prostate symptoms:  Medicines that shrink the prostate. This helps relieve symptoms. These medicines take time to work, and it may be months before any improvement is seen.  Uncommon side effects include problems with sexual function.  Medicines to relax the muscle of the prostate. This also relieves the obstruction by reducing any compression on the urethra.This group of medicines work much  faster than those that reduce the size of the prostate gland. Usually, one can experience improvement in days to weeks..  Side effects can include dizziness, fatigue, lightheadedness, and retrograde ejaculation (diminished volume of ejaculate). Several types of surgical treatments are available for relief of prostate symptoms:  Transurethral resection of the prostate (TURP)--In this treatment, an instrument is inserted through opening at the tip of the penis. It is used to cut away pieces of the inner core of the prostate. The pieces are removed through the same opening of the penis. This removes the obstruction and helps get rid of the symptoms.  Transurethral incision (TUIP)--In this procedure, small cuts are made in the prostate. This lessens the prostates pressure on the urethra.  Transurethral microwave thermotherapy (TUMT)--This procedure uses microwaves to create heat. The heat destroys and removes a small amount of prostate tissue.  Transurethral needle ablation (TUNA)--This is a procedure that uses radio frequencies to do the same as TUMT.  Interstitial laser coagulation (ILC)--This is a procedure that uses a laser to do the same as TUMT and TUNA.  Transurethral electrovaporization (TUVP)--This is a procedure that uses electrodes to do the same as the procedures listed above. SEEK MEDICAL CARE IF:   You develop a fever.  There is unexplained back pain.  Symptoms are not helped by medicines prescribed.  You develop side effects from the medicine you  are taking.  Your urine becomes very dark or has a bad smell.  Your lower abdomen becomes distended and you have difficulty passing your urine. SEEK IMMEDIATE MEDICAL CARE IF:   You are suddenly unable to urinate. This is an emergency. You should be seen immediately.  There are large amounts of blood or clots in the urine.  Your urinary problems become unmanageable.  You develop lightheadedness, severe dizziness, or you feel  faint.  You develop moderate to severe low back or flank pain.  You develop chills or fever.   This information is not intended to replace advice given to you by your health care provider. Make sure you discuss any questions you have with your health care provider.   Document Released: 03/13/2005 Document Revised: 03/18/2013 Document Reviewed: 09/26/2012 Elsevier Interactive Patient Education 2016 [facility], Care After Refer to this sheet in the next few weeks. These instructions provide you with information on caring for yourself after your procedure. Your health care provider may also give you more specific instructions. Your treatment has been planned according to current medical practices, but problems sometimes occur. Call your health care provider if you have any problems or questions after your procedure. WHAT TO EXPECT AFTER THE PROCEDURE  You may notice blood in your urine that could last up to 3 weeks.  After your catheter is removed, you will have burning (especially at the tip of your penis) when you urinate, especially at the end of urination. For the first few weeks after your procedure, you will feel the need to urinate often. HOME CARE INSTRUCTIONS   Do not perform vigorous exercise, especially heavy lifting, for 1 week or as directed by your health care provider.  Avoid sexual activity for 4-6 weeks or as directed by your health care provider.  Do not ride in a car for extended periods for 1 month or as directed by your health care provider.  Do not strain to have a bowel movement. Drink a lot of fluids and and make sure you get enough fiber in your diet.  Drink enough fluids to keep your urine clear or pale yellow. SEEK IMMEDIATE MEDICAL CARE IF:   Your catheter has been removed and you are suddenly unable to urinate.  Your catheter has not been removed, and it develops a blockage.  You start to have blood clots in your urine.  The  blood in your urine becomes persistent or gets thick.  Your temperature is greater than 100.79F (38.1C).  You develop chest pains.  You develop shortness of breath.  You develop leg swelling or pain. MAKE SURE YOU:  Understand these instructions.  Will watch your condition.  Will get help right away if you are not doing well or get worse.   This information is not intended to replace advice given to you by your health care provider. Make sure you discuss any questions you have with your health care provider.   Document Released: 03/13/2005 Document Revised: 03/18/2013 Document Reviewed: 09/02/2012 Elsevier Interactive Patient Education 2016 [place] Light Laser Prostate Treatment Green light laser therapy is a procedure that uses a special high-energy laser for vaporizing extra prostate tissue. It is less invasive than traditional methods of prostate surgery, which involve cutting out the prostate tissue. Because the tissue is vaporized rather than cut out there is generally less blood loss. LET [facility] CARE PROVIDER KNOW ABOUT:  Any allergies you have.  Any medicines you are taking, including vitamins,  herbs, eye drops, creams, and over-the-counter medication.  Previous problems you or members of your family have had with the use of anesthetics.  Any blood disorders you have.  Previous surgeries you have had.  Medical conditions you have. RISKS AND COMPLICATIONS Generally, green light laser prostate treatment is a safe procedure. However, as with any procedure, complications can occur. Possible complications include:  Urinary tract infection.  Erectile dysfunction (rare).  Dry ejaculation--Semen is not released when you reach sexual climax.  Scar tissue in the urinary passage. BEFORE THE PROCEDURE   Your health care provider may discuss medicines you are taking and may advise you to stop taking specific ones.  You may be given antibiotic medicine to  take as a precaution against bacterial infection.  Do not eat or drink anything for 8 hours before your procedure or as directed by your health care provider. You may have a sip of water to take any necessary medicines. PROCEDURE Depending on the size and shape of your prostate, the procedure may take 30-60 minutes. You will be given one of the following:   A medicine that makes you go to sleep (general anesthetic).  A medicine injected into your spine that numbs your body below the waist (spinal anesthetic). Sedation is usually given with spinal anesthetic so you will be relaxed. A tube containing viewing scopes and instruments will be inserted through your penis so that no cuts (incisions) are needed. A thin fiber is put through the tube and positioned next to the excess prostate tissue. Pulses of laser light come from the end of the fiber and are projected onto the excess tissue. The laser beam is absorbed by your blood, which becomes hot enough to vaporize the excess prostate tissue. This laser beam will seal off the blood vessels, decreasing bleeding. The tube with the viewing scopes, instruments, and thin fiber will be removed and replaced with a temporary catheter. AFTER THE PROCEDURE  After the surgery, you will be sent to the recovery room for a short time. Depending on factors such as the amount of prostate tissue vaporized, the strength of your bladder, and the amount of bleeding expected, the catheter may be removed. Generally, overnight stay is not needed and you will be sent home on the same day as the procedure. You may be sent home with elastic support stockings to help prevent blood clots in your legs.    This information is not intended to replace advice given to you by your health care provider. Make sure you discuss any questions you have with your health care provider.   Document Released: 06/20/2007 Document Revised: 03/18/2013 Document Reviewed: 09/02/2012 Elsevier Interactive  Patient Education 2016 [place]   1) The drugs that you were given will stay in your system until tomorrow so for the next 24 hours you should not:  A) Drive an automobile B) Make any legal decisions C) Drink any alcoholic beverage   2) You may resume regular meals tomorrow.  Today it is better to start with liquids and gradually work up to solid foods.  You may eat anything you prefer, but it is better to start with liquids, then soup and crackers, and gradually work up to solid foods.   3) Please notify your doctor immediately if you have any unusual bleeding, trouble breathing, redness and pain at the surgery site, drainage, fever, or pain not relieved by medication.    4) Additional Instructions:  Drink plenty of fluids.  Take stool softeners if you are utilizing pain meds on a regular basis.          Please contact your physician with any problems or Same Day Surgery at [phone], Monday through Friday 6 am to 4 pm, or [place] [facility] number at [phone].

## 2015-08-17 NOTE — Anesthesia Procedure Notes (Signed)
Procedure Name: LMA Insertion Date/Time: 08/17/2015 1:06 PM Performed by: Dionne Bucy Pre-anesthesia Checklist: Patient identified, Patient being monitored, Timeout performed, Emergency Drugs available and Suction available Patient Re-evaluated:Patient Re-evaluated prior to inductionOxygen Delivery Method: Circle system utilized Preoxygenation: Pre-oxygenation with 100% oxygen Intubation Type: IV induction Ventilation: Mask ventilation without difficulty LMA: LMA inserted LMA Size: 5.0 Tube type: Oral Number of attempts: 1 Placement Confirmation: positive ETCO2 Tube secured with: Tape Dental Injury: Teeth and Oropharynx as per pre-operative assessment

## 2015-08-17 NOTE — Op Note (Signed)
Preoperative diagnosis: 1. Urinary retention                                            2. BPH with bladder outlet obstruction Postoperative diagnosis: Same  Procedure: Photovaporization of the prostate with greenlight laser   Surgeon: Otelia Limes. Yves Dill MD, FACS Anesthesia: Gen.  Indications:See the history and physical. After informed consent the above procedure(s) were requested     Technique and findings: After adequate general anesthesia had been obtained patient was placed into dorsal lithotomy position and the perineum was prepped and draped in the usual fashion. The laser scope was coupled to the camera and visually advanced into the bladder. Patient had lateral lobe prostatic hypertrophy with visual obstruction. Latter was thoroughly inspected. Both ureteral orifices were identified and had clear reflux. No bladder mucosal lesions were identified. The XPS greenlight laser fiber was introduced through the scope and power set at 9 W. Bladder neck tissue was vaporized. Power was increased to 120 W and tissue vaporized from bladder neck to the verumontanum. Power was increased to 180 W and remaining obstructive tissue was vaporized. The scope was then removed and 10 cc of viscous Xylocaine instilled within the urethra and the bladder. A 24 French Foley catheter was placed and irrigated until clear. A B&O suppository was placed. Procedure was then terminated and patient transferred to the recovery room in stable condition.

## 2015-08-17 NOTE — Anesthesia Preprocedure Evaluation (Signed)
Anesthesia Evaluation  Patient identified by MRN, date of birth, ID band Patient awake    Reviewed: Allergy & Precautions, H&P , NPO status , Patient's Chart, lab work & pertinent test results, reviewed documented beta blocker date and time   Airway Mallampati: III  TM Distance: >3 FB Neck ROM: full    Dental no notable dental hx. (+) Edentulous Upper, Upper Dentures, Caps, Missing   Pulmonary neg pulmonary ROS, former smoker,    Pulmonary exam normal breath sounds clear to auscultation       Cardiovascular Exercise Tolerance: Good hypertension, (-) angina(-) CAD, (-) Past MI, (-) Cardiac Stents and (-) CABG Normal cardiovascular exam(-) dysrhythmias (-) Valvular Problems/Murmurs Rhythm:regular Rate:Normal     Neuro/Psych negative neurological ROS  negative psych ROS   GI/Hepatic Neg liver ROS, GERD  ,  Endo/Other  negative endocrine ROS  Renal/GU negative Renal ROS  negative genitourinary   Musculoskeletal   Abdominal   Peds  Hematology negative hematology ROS (+)   Anesthesia Other Findings Past Medical History:   Diverticulitis                                               Hyperlipidemia                                               Hypertension                                                 BPH (benign prostatic hypertrophy)                           Schatzki's ring                                                Comment:grade 1 varices   ED (erectile dysfunction)                                    Impaired fasting glucose                                     Arthritis                                                    Personal history of colonic adenoma             05/23/2007      Comment:04/2007 - 6 mm adenoma   GERD (gastroesophageal reflux disease)                       Cancer (HCC)  Comment:PROSTATE, BASAL CELL AND SQUAMOUS CELL    Reproductive/Obstetrics negative OB ROS                             Anesthesia Physical Anesthesia Plan  ASA: II  Anesthesia Plan: General   Post-op Pain Management:    Induction:   Airway Management Planned:   Additional Equipment:   Intra-op Plan:   Post-operative Plan:   Informed Consent: I have reviewed the patients History and Physical, chart, labs and discussed the procedure including the risks, benefits and alternatives for the proposed anesthesia with the patient or authorized representative who has indicated his/her understanding and acceptance.   Dental Advisory Given  Plan Discussed with: Anesthesiologist, CRNA and Surgeon  Anesthesia Plan Comments:         Anesthesia Quick Evaluation

## 2015-08-17 NOTE — H&P (Signed)
Date of Initial H&P: 08/12/15  History reviewed, patient examined, no change in status, stable for surgery.

## 2015-08-18 ENCOUNTER — Encounter: Payer: Self-pay | Admitting: Urology

## 2015-08-19 NOTE — Anesthesia Postprocedure Evaluation (Signed)
Anesthesia Post Note  Patient: Zachary Green  Procedure(s) Performed: Procedure(s) (LRB): GREEN LIGHT LASER TURP (TRANSURETHRAL RESECTION OF PROSTATE (N/A)  Patient location during evaluation: PACU Anesthesia Type: General Level of consciousness: awake and alert Pain management: pain level controlled Vital Signs Assessment: post-procedure vital signs reviewed and stable Respiratory status: spontaneous breathing, nonlabored ventilation, respiratory function stable and patient connected to nasal cannula oxygen Cardiovascular status: blood pressure returned to baseline and stable Postop Assessment: no signs of nausea or vomiting Anesthetic complications: no    Last Vitals:  Filed Vitals:   08/17/15 1519 08/17/15 1528  BP: 131/69 147/65  Pulse: 65 66  Temp:  36.4 C  Resp: 12 16    Last Pain:  Filed Vitals:   08/17/15 1532  PainSc: [place]

## 2016-02-22 ENCOUNTER — Other Ambulatory Visit: Payer: Self-pay | Admitting: Gastroenterology

## 2016-02-22 DIAGNOSIS — R1310 Dysphagia, unspecified: Secondary | ICD-10-CM

## 2016-02-22 DIAGNOSIS — K30 Functional dyspepsia: Secondary | ICD-10-CM

## 2016-02-22 DIAGNOSIS — R1013 Epigastric pain: Secondary | ICD-10-CM

## 2016-02-25 ENCOUNTER — Ambulatory Visit: Payer: BLUE CROSS/BLUE SHIELD

## 2016-03-07 ENCOUNTER — Other Ambulatory Visit: Payer: Self-pay | Admitting: Urology

## 2016-03-07 ENCOUNTER — Ambulatory Visit: Payer: Medicare Other

## 2016-03-07 ENCOUNTER — Emergency Department: Payer: Medicare Other

## 2016-03-07 ENCOUNTER — Encounter: Payer: Self-pay | Admitting: *Deleted

## 2016-03-07 ENCOUNTER — Emergency Department
Admission: EM | Admit: 2016-03-07 | Discharge: 2016-03-07 | Disposition: A | Discharge status: Home / Self Care | Payer: Medicare Other | Attending: Emergency Medicine | Admitting: Emergency Medicine

## 2016-03-07 DIAGNOSIS — I10 Essential (primary) hypertension: Secondary | ICD-10-CM | POA: Diagnosis not present

## 2016-03-07 DIAGNOSIS — R1010 Upper abdominal pain, unspecified: Secondary | ICD-10-CM

## 2016-03-07 DIAGNOSIS — Z8546 Personal history of malignant neoplasm of prostate: Secondary | ICD-10-CM | POA: Insufficient documentation

## 2016-03-07 DIAGNOSIS — N390 Urinary tract infection, site not specified: Secondary | ICD-10-CM

## 2016-03-07 DIAGNOSIS — Z79899 Other long term (current) drug therapy: Secondary | ICD-10-CM | POA: Diagnosis not present

## 2016-03-07 DIAGNOSIS — C61 Malignant neoplasm of prostate: Secondary | ICD-10-CM

## 2016-03-07 DIAGNOSIS — I4891 Unspecified atrial fibrillation: Secondary | ICD-10-CM | POA: Diagnosis not present

## 2016-03-07 DIAGNOSIS — R350 Frequency of micturition: Secondary | ICD-10-CM | POA: Diagnosis present

## 2016-03-07 DIAGNOSIS — Z87891 Personal history of nicotine dependence: Secondary | ICD-10-CM | POA: Diagnosis not present

## 2016-03-07 DIAGNOSIS — R911 Solitary pulmonary nodule: Secondary | ICD-10-CM | POA: Diagnosis not present

## 2016-03-07 DIAGNOSIS — R102 Pelvic and perineal pain: Secondary | ICD-10-CM

## 2016-03-07 DIAGNOSIS — R918 Other nonspecific abnormal finding of lung field: Secondary | ICD-10-CM

## 2016-03-07 LAB — CBC WITH DIFFERENTIAL/PLATELET
Basophils Absolute: 0 10*3/uL (ref 0–0.1)
Basophils Relative: 0 %
EOS PCT: 0 %
Eosinophils Absolute: 0 10*3/uL (ref 0–0.7)
HEMATOCRIT: 46.8 % (ref 40.0–52.0)
Hemoglobin: 15.9 g/dL (ref 13.0–18.0)
LYMPHS PCT: 26 %
Lymphs Abs: 1.3 10*3/uL (ref 1.0–3.6)
MCH: 32.1 pg (ref 26.0–34.0)
MCHC: 33.9 g/dL (ref 32.0–36.0)
MCV: 94.6 fL (ref 80.0–100.0)
MONO ABS: 0.3 10*3/uL (ref 0.2–1.0)
MONOS PCT: 6 %
NEUTROS ABS: 3.5 10*3/uL (ref 1.4–6.5)
Neutrophils Relative %: 68 %
PLATELETS: 174 10*3/uL (ref 150–440)
RBC: 4.94 MIL/uL (ref 4.40–5.90)
RDW: 14.3 % (ref 11.5–14.5)
WBC: 5.1 10*3/uL (ref 3.8–10.6)

## 2016-03-07 LAB — URINALYSIS, COMPLETE (UACMP) WITH MICROSCOPIC
Bacteria, UA: NONE SEEN
Bilirubin Urine: NEGATIVE
Glucose, UA: NEGATIVE mg/dL
HGB URINE DIPSTICK: NEGATIVE
Ketones, ur: NEGATIVE mg/dL
Leukocytes, UA: NEGATIVE
Nitrite: POSITIVE — AB
PH: 7 (ref 5.0–8.0)
Protein, ur: NEGATIVE mg/dL
SPECIFIC GRAVITY, URINE: 1.004 — AB (ref 1.005–1.030)
Squamous Epithelial / LPF: NONE SEEN

## 2016-03-07 LAB — TROPONIN I

## 2016-03-07 LAB — COMPREHENSIVE METABOLIC PANEL
ALT: 24 U/L (ref 17–63)
ANION GAP: 6 (ref 5–15)
AST: 24 U/L (ref 15–41)
Albumin: 4.5 g/dL (ref 3.5–5.0)
Alkaline Phosphatase: 42 U/L (ref 38–126)
BILIRUBIN TOTAL: 1.8 mg/dL — AB (ref 0.3–1.2)
BUN: 13 mg/dL (ref 6–20)
CO2: 29 mmol/L (ref 22–32)
Calcium: 9.4 mg/dL (ref 8.9–10.3)
Chloride: 99 mmol/L — ABNORMAL LOW (ref 101–111)
Creatinine, Ser: 0.9 mg/dL (ref 0.61–1.24)
Glucose, Bld: 96 mg/dL (ref 65–99)
POTASSIUM: 3.7 mmol/L (ref 3.5–5.1)
Sodium: 134 mmol/L — ABNORMAL LOW (ref 135–145)
TOTAL PROTEIN: 6.8 g/dL (ref 6.5–8.1)

## 2016-03-07 LAB — TSH: TSH: 2.632 u[IU]/mL (ref 0.350–4.500)

## 2016-03-07 LAB — LIPASE, BLOOD: LIPASE: 29 U/L (ref 11–51)

## 2016-03-07 MED ORDER — IOPAMIDOL (ISOVUE-300) INJECTION 61%
100.0000 mL | Freq: Once | INTRAVENOUS | Status: AC | PRN
  Administered 2016-03-07: 100 mL via INTRAVENOUS

## 2016-03-07 MED ORDER — APIXABAN 5 MG PO TABS: 5.0000 mg | ORAL_TABLET | Freq: Two times a day (BID) | ORAL | 0 refills | Status: DC

## 2016-03-07 MED ORDER — APIXABAN 5 MG PO TABS
5.0000 mg | ORAL_TABLET | Freq: Once | ORAL | Status: AC
  Administered 2016-03-07: 5 mg via ORAL

## 2016-03-07 MED ORDER — IOPAMIDOL (ISOVUE-300) INJECTION 61%
30.0000 mL | Freq: Once | INTRAVENOUS | Status: AC | PRN
  Administered 2016-03-07: 30 mL via ORAL

## 2016-03-07 MED ORDER — CEPHALEXIN 500 MG PO CAPS: 500.0000 mg | ORAL_CAPSULE | Freq: Three times a day (TID) | ORAL | 0 refills | Status: AC

## 2016-03-07 MED ORDER — CEPHALEXIN 500 MG PO CAPS
500.0000 mg | ORAL_CAPSULE | Freq: Once | ORAL | Status: AC
Start: 2016-03-07 — End: 2016-03-07
  Administered 2016-03-07: 500 mg via ORAL
  Filled 2016-03-07: qty 1

## 2016-03-07 MED ORDER — APIXABAN 5 MG PO TABS
5.0000 mg | ORAL_TABLET | Freq: Two times a day (BID) | ORAL | Status: DC
  Filled 2016-03-07: qty 1

## 2016-03-07 MED ORDER — METOPROLOL SUCCINATE ER 25 MG PO TB24: 25.0000 mg | ORAL_TABLET | Freq: Every day | ORAL | 0 refills | Status: DC

## 2016-03-07 NOTE — ED Triage Notes (Signed)
Per EMS report, patient was being seen at Dr. Donalda Ewings office and became dizzy. Patient's son states patient has been dizzy for one week off and on. On EMTs monitor, patient shows new onset afib.

## 2016-03-07 NOTE — ED Provider Notes (Signed)
[facility] Emergency Department Provider Note  ____________________________________________   First MD Initiated Contact with Patient 03/07/16 1233     (approximate)  I have reviewed the triage vital signs and the nursing notes.   HISTORY  Chief Complaint Atrial Fibrillation   HPI Zachary Green is a 80 y.o. male with a history of BPH treated with radiation over the summer who is now had a recurrence of urinary frequency as well as urinating freely overnight and lower abdominal pain. He says that he has seen his urologist, Dr. Rogers Blocker, multiple times to get a cystoscopy and did not find any abnormalities. He also says that he has had multiple urinalyses of his urologist's office without any positive results. The patient is returning to the emergency department today because of recent episodes of weakness over the past several days. The patient says that yesterday he was standing up when he felt suddenly weak like he was going to pass out. He had another episode similar to that today at his urologists that lasted about 25 minutes. His son, who is a history of atrophic relation, took the patient's pulse and found to be in atrial fibrillation. The patient does not a history of atrial fibrillation. Denies any pain at this time.   Past Medical History:  Diagnosis Date  . Arthritis   . BPH (benign prostatic hypertrophy)   . Cancer (HCC)    PROSTATE, BASAL CELL AND SQUAMOUS CELL  . Diverticulitis   . ED (erectile dysfunction)   . GERD (gastroesophageal reflux disease)   . Hyperlipidemia   . Hypertension   . Impaired fasting glucose   . Personal history of colonic adenoma 05/23/2007   04/2007 - 6 mm adenoma  . Schatzki's ring    grade 1 varices    Patient Active Problem List   Diagnosis Date Noted  . Routine general medical examination at a health care facility 11/22/2010  . UNSPEC POLYARTHROPATHY/POLYARTHRITIS SITE UNSPEC 05/17/2010  . OSTEOARTHRITIS  10/29/2007  . IMPAIRED FASTING GLUCOSE 10/29/2007  . Personal history of colonic adenoma 05/23/2007  . BUNION, LEFT FOOT 11/22/2006  . SCHATZKI'S RING 11/16/2006  . HYPERLIPIDEMIA 11/12/2006  . ERECTILE DYSFUNCTION 11/12/2006  . HYPERTENSION 11/12/2006  . DIVERTICULOSIS, COLON 11/12/2006  . BENIGN PROSTATIC HYPERTROPHY 11/12/2006    Past Surgical History:  Procedure Laterality Date  . COLONOSCOPY    . ESOPHAGOGASTRODUODENOSCOPY    . ESOPHAGOGASTRODUODENOSCOPY    . GREEN LIGHT LASER TURP (TRANSURETHRAL RESECTION OF PROSTATE N/A 08/17/2015   Procedure: GREEN LIGHT LASER TURP (TRANSURETHRAL RESECTION OF PROSTATE;  Surgeon: Royston Cowper, MD;  Location: [facility] ORS;  Service: Urology;  Laterality: N/A;  . NM MYOVIEW LTD    . PATELLA FRACTURE SURGERY     pin placed 1984  . REFRACTIVE SURGERY      Prior to Admission medications   Medication Sig Start Date End Date Taking? Authorizing Provider  acetaminophen-codeine (TYLENOL #3) 300-30 MG tablet Take 1-2 tablets by mouth every 4 (four) hours as needed for moderate pain. 08/17/15   Royston Cowper, MD  alfuzosin (UROXATRAL) 10 MG 24 hr tablet Take 10 mg by mouth at bedtime.    Historical Provider, MD  docusate sodium (COLACE) 100 MG capsule Take 100 mg by mouth 2 (two) times daily.    Historical Provider, MD  docusate sodium (COLACE) 100 MG capsule Take 2 capsules (200 mg total) by mouth 2 (two) times daily. 08/17/15   Royston Cowper, MD  doxycycline (ADOXA) 50 MG  tablet Take 50 mg by mouth daily.    Historical Provider, MD  esomeprazole (NEXIUM) 40 MG capsule Take 40 mg by mouth every morning.    Historical Provider, MD  hydrochlorothiazide (MICROZIDE) 12.5 MG capsule Take 12.5 mg by mouth every morning.    Historical Provider, MD  levofloxacin (LEVAQUIN) 500 MG tablet Take 1 tablet (500 mg total) by mouth daily. 08/17/15   Royston Cowper, MD  losartan (COZAAR) 50 MG tablet Take 50 mg by mouth every morning.    Historical Provider, MD    Methen-Hyosc-Meth Blue-Na Phos (UROGESIC-BLUE) 81.6 MG TABS Take 1 tablet (81.6 mg total) by mouth every 6 (six) hours as needed. 08/17/15   Royston Cowper, MD  Multiple Vitamin (MULTIVITAMIN) tablet Take 1 tablet by mouth daily.      Historical Provider, MD  Multiple Vitamins-Minerals (PRESERVISION AREDS 2 PO) Take 2 tablets by mouth daily.    Historical Provider, MD  pravastatin (PRAVACHOL) 40 MG tablet TAKE ONE TABLET BY MOUTH EVERY DAY Patient taking differently: TAKE ONE TABLET BY MOUTH EVERY MORNING 10/08/11   Venia Carbon, MD    Allergies Patient has no known allergies.  Family History  Problem Relation Age of Onset  . Alzheimer's disease Mother   . Diabetes Brother     Social History Social History  Substance Use Topics  . Smoking status: Former Smoker    Packs/day: 1.00    Years: 7.00    Types: Cigarettes  . Smokeless tobacco: Never Used  . Alcohol use No    Review of Systems Constitutional: No fever/chills Eyes: No visual changes. ENT: No sore throat. Cardiovascular: Denies chest pain. Respiratory: Denies shortness of breath. Gastrointestinal: No nausea, no vomiting.  No diarrhea.  No constipation. Genitourinary: Negative for dysuria. Musculoskeletal: Negative for back pain. Skin: Negative for rash. Neurological: Negative for headaches, focal weakness or numbness.  10-point ROS otherwise negative.  ____________________________________________   PHYSICAL EXAM:  VITAL SIGNS: ED Triage Vitals  Enc Vitals Group     BP 03/07/16 1229 (!) 154/99     Pulse Rate 03/07/16 1229 80     Resp 03/07/16 1229 18     Temp 03/07/16 1229 98 F (36.7 C)     Temp Source 03/07/16 1229 Oral     SpO2 03/07/16 1229 99 %     Weight 03/07/16 1231 194 lb (88 kg)     Height 03/07/16 1231 5\' 7"  (1.702 m)     Head Circumference --      Peak Flow --      Pain Score 03/07/16 1231 1     Pain Loc --      Pain Edu? --      Excl. in [place]? --     Constitutional: Alert and  oriented. Well appearing and in no acute distress. Eyes: Conjunctivae are normal. PERRL. EOMI. Head: Atraumatic. Nose: No congestion/rhinnorhea. Mouth/Throat: Mucous membranes are moist.   Neck: No stridor.   Cardiovascular: Irregularly irregular rhythm. Normal rate. Grossly normal heart sounds.   Respiratory: Normal respiratory effort.  No retractions. Lungs CTAB. Gastrointestinal: Soft and nontender. No distention.  Musculoskeletal: No lower extremity tenderness nor edema.   Neurologic:  Normal speech and language. No gross focal neurologic deficits are appreciated. Skin:  Skin is warm, dry and intact. No rash noted. Psychiatric: Mood and affect are normal. Speech and behavior are normal.  ____________________________________________   LABS (all labs ordered are listed, but only abnormal results are displayed)  Labs Reviewed  COMPREHENSIVE  METABOLIC PANEL - Abnormal; Notable for the following:       Result Value   Sodium 134 (*)    Chloride 99 (*)    Total Bilirubin 1.8 (*)    All other components within normal limits  URINALYSIS, COMPLETE (UACMP) WITH MICROSCOPIC - Abnormal; Notable for the following:    Color, Urine AMBER (*)    APPearance CLEAR (*)    Specific Gravity, Urine 1.004 (*)    Nitrite POSITIVE (*)    All other components within normal limits  CBC WITH DIFFERENTIAL/PLATELET  LIPASE, BLOOD  TROPONIN I  TSH   ____________________________________________  EKG  ED ECG REPORT I, Doran Stabler, the attending physician, personally viewed and interpreted this ECG.   Date: 03/07/2016  EKG Time: 1245  Rate: 78  Rhythm: atrial fibrillation, rate 78  Axis: Normal  Intervals:none  ST&T Change: No ST segment elevation or depression. No abnormal T-wave inversion.  ____________________________________________  RADIOLOGY  DG Chest 1 View (Accession [id number]) (Order [id number])  Imaging  Date: 03/07/2016 Department: [facility]  EMERGENCY DEPARTMENT Released By/Authorizing: Orbie Pyo, MD (auto-released)  Exam Information   Status Exam Begun  Exam Ended   Final [99] 03/07/2016 12:45 PM 03/07/2016 12:46 PM  PACS Images   Show images for DG Chest 1 View  Study Result   CLINICAL DATA:  Patient reports weakness x2-3 weeks. Patients primary care physician sent patient to hospital due to concern for afib. Denies CP or SOB. Hx HTN, GERD. Former smoker.  EXAM: CHEST 1 VIEW  COMPARISON:  None.  FINDINGS: Midline trachea. Mild cardiomegaly. Tortuous thoracic aorta. No pleural effusion or pneumothorax. No congestive failure. Possible right upper lobe linear scarring.  IMPRESSION: Cardiomegaly without congestive failure.   Electronically Signed   By: Abigail Miyamoto M.D.   On: 03/07/2016 12:55    CT Abdomen Pelvis W Contrast (Accession [id number]) (Order [id number])  Imaging  Date: 03/07/2016 Department: [facility] EMERGENCY DEPARTMENT Released By/Authorizing: Orbie Pyo, MD (auto-released)  Exam Information   Status Exam Begun  Exam Ended   Final [99] 03/07/2016 3:27 PM 03/07/2016 3:59 PM  PACS Images   Show images for CT Abdomen Pelvis W Contrast  Study Result   CLINICAL DATA:  Lower abdomen pain, dizziness, new onset atrial fibrillation  EXAM: CT ABDOMEN AND PELVIS WITH CONTRAST  TECHNIQUE: Multidetector CT imaging of the abdomen and pelvis was performed using the standard protocol following bolus administration of intravenous contrast.  CONTRAST:  161mL ISOVUE-300 IOPAMIDOL (ISOVUE-300) INJECTION 61%  COMPARISON:  None.  FINDINGS: Lower chest: There are several noncalcified nodules in the right lower lobe the largest measuring 8 mm in diameter. No definite nodule is seen within the left lower lobe. The heart is within normal limits in size.  Hepatobiliary: The liver enhances with no focal abnormality in the liver is somewhat  small in size. No calcified gallstones are noted.  Pancreas: The pancreas is unremarkable and the pancreatic duct is not dilated.  Spleen: The spleen is within normal limits in size.  Adrenals/Urinary Tract: The adrenal glands are symmetrical with no abnormality noted. The kidneys enhance with no calculus or mass and on delayed images, the pelvocaliceal systems are unremarkable. The ureters are normal in caliber. The urinary bladder is not optimally distended. There are at least 2 urinary bladder diverticula present the larger being anteriorly and superiorly positions.  Stomach/Bowel: The stomach is largely decompressed. No small bowel dilatation is seen. The rectosigmoid colon is  somewhat elongated and there are multiple rectosigmoid colon diverticula. No diverticulitis is seen. The terminal ileum and the appendix are unremarkable.  Vascular/Lymphatic: The abdominal aorta is normal in caliber with moderate abdominal aortic atherosclerosis present. No adenopathy is seen.  Reproductive: The prostate is normal in size for age.  Other: None  Musculoskeletal: Hypertrophic bone formation around the posterior left ischial ramus probably represents previous trauma with healing. The lumbar vertebrae are normal alignment with degenerative disc disease at L2-3 and L4-5 levels.  IMPRESSION: 1. Several lung nodules at the right lung base. Cannot exclude metastatic involvement. Consider CT of the chest. 2. At least 2 urinary bladder diverticula. 3. Multiple rectosigmoid colon diverticula.  No diverticulitis. 4. Moderate abdominal aortic atherosclerosis.   Electronically Signed   By: Ivar Drape M.D.   On: 03/07/2016 16:10    ____________________________________________   PROCEDURES  Procedure(s) performed:   Procedures  Critical Care performed:   ____________________________________________   INITIAL IMPRESSION / ASSESSMENT AND PLAN / ED COURSE  Pertinent  labs & imaging results that were available during my care of the patient were reviewed by me and considered in my medical decision making (see chart for details).  Chadds 2 score of 2.  Clinical Course    I discussed the case with Dr. and of the [place] group. The patient and his family are requesting the [place] group today can be seen by Dr. Rockey Situ.  Dr. Saunders Revel recommends eliquis as well as metoprolol succinate 25 mg daily and stopping the patient's HCTZ. Also recommends follow-up within 1 week.   ----------------------------------------- 5:40 PM on 03/07/2016 -----------------------------------------  Patient with lung nodules on his CAT scan but without any acute intra-abdominal processes. We discussed the findings included diverticula. The patient will be following up Dr. Rogers Blocker. We'll also treat for UTI given the patient is nitrite positive. Patient will be treated with eliquis. Will be discharged home. To follow-up with [facility] cardiology as well as primary care doctor for surveillance of his pulmonary nodules. Also discussed stopping the patient's chlorothiazide. They are understanding of the plan and willing to comply. ____________________________________________   FINAL CLINICAL IMPRESSION(S) / ED DIAGNOSES  Pulmonary nodules. New-onset atrial fibrillation. Urinary tract infection.    NEW MEDICATIONS STARTED DURING THIS VISIT:  New Prescriptions   No medications on file     Note:  This document was prepared using Dragon voice recognition software and may include unintentional dictation errors.    Orbie Pyo, MD 03/07/16 [phone]

## 2016-03-09 ENCOUNTER — Ambulatory Visit (INDEPENDENT_AMBULATORY_CARE_PROVIDER_SITE_OTHER): Payer: Medicare Other

## 2016-03-09 ENCOUNTER — Ambulatory Visit (INDEPENDENT_AMBULATORY_CARE_PROVIDER_SITE_OTHER): Payer: Medicare Other | Admitting: Cardiovascular Disease

## 2016-03-09 ENCOUNTER — Other Ambulatory Visit: Payer: Self-pay

## 2016-03-09 ENCOUNTER — Encounter: Payer: Self-pay | Admitting: Cardiovascular Disease

## 2016-03-09 VITALS — BP 128/78 | HR 78 | Ht 67.0 in | Wt 191.0 lb

## 2016-03-09 DIAGNOSIS — I4891 Unspecified atrial fibrillation: Secondary | ICD-10-CM

## 2016-03-09 DIAGNOSIS — E784 Other hyperlipidemia: Secondary | ICD-10-CM | POA: Diagnosis not present

## 2016-03-09 DIAGNOSIS — I10 Essential (primary) hypertension: Secondary | ICD-10-CM | POA: Diagnosis not present

## 2016-03-09 DIAGNOSIS — R0602 Shortness of breath: Secondary | ICD-10-CM

## 2016-03-09 DIAGNOSIS — Z136 Encounter for screening for cardiovascular disorders: Secondary | ICD-10-CM

## 2016-03-09 DIAGNOSIS — E7849 Other hyperlipidemia: Secondary | ICD-10-CM

## 2016-03-09 LAB — URINE CULTURE: CULTURE: NO GROWTH

## 2016-03-09 LAB — ECHOCARDIOGRAM COMPLETE
Height: 67 in
WEIGHTICAEL: 3056 [oz_av]

## 2016-03-09 NOTE — Patient Instructions (Addendum)
Medication Instructions:  Your physician recommends that you continue on your current medications as directed. Please refer to the Current Medication list given to you today.   Labwork: none  Testing/Procedures: Your physician has requested that you have a lexiscan myoview. For further information please visit [email or web address]. Please follow instruction sheet, as given.  Zachary Green  Your caregiver has ordered a Stress Test with nuclear imaging. The purpose of this test is to evaluate the blood supply to your heart muscle. This procedure is referred to as a "Non-Invasive Stress Test." This is because other than having an IV started in your vein, nothing is inserted or "invades" your body. Cardiac stress tests are done to find areas of poor blood flow to the heart by determining the extent of coronary artery disease (CAD). Some patients exercise on a treadmill, which naturally increases the blood flow to your heart, while others who are  unable to walk on a treadmill due to physical limitations have a pharmacologic/chemical stress agent called Lexiscan . This medicine will mimic walking on a treadmill by temporarily increasing your coronary blood flow.   Please note: these test may take anywhere between 2-4 hours to complete  PLEASE REPORT TO [place] AT THE FIRST DESK WILL DIRECT YOU WHERE TO GO  Date of Procedure:__Thursday, December 21___  Arrival Time for Procedure:_7:45am___  Instructions regarding medication:    __xx__:  Hold metoprolol night before procedure and morning of procedure    PLEASE NOTIFY THE OFFICE AT LEAST 24 HOURS IN ADVANCE IF YOU ARE UNABLE TO KEEP YOUR APPOINTMENT.  [phone] AND  PLEASE NOTIFY NUCLEAR MEDICINE AT [facility] AT LEAST 24 HOURS IN ADVANCE IF YOU ARE UNABLE TO KEEP YOUR APPOINTMENT. [phone]  How to prepare for your Myoview test:  1. Do not eat or drink after midnight 2. No caffeine for 24 hours prior to  test 3. No smoking 24 hours prior to test. 4. Your medication may be taken with water.  If your doctor stopped a medication because of this test, do not take that medication. 5. Ladies, please do not wear dresses.  Skirts or pants are appropriate. Please wear a short sleeve shirt. 6. No perfume, cologne or lotion. 7. Wear comfortable walking shoes. No heels!          Your physician has requested that you have an echocardiogram. Echocardiography is a painless test that uses sound waves to create images of your heart. It provides your doctor with information about the size and shape of your heart and how well your heart's chambers and valves are working. This procedure takes approximately one hour. There are no restrictions for this procedure.    Follow-Up: Your physician recommends that you schedule a follow-up appointment in: 2 weeks with [name], PA   Any Other Special Instructions Will Be Listed Below (If Applicable).     If you need a refill on your cardiac medications before your next appointment, please call your pharmacy.  Cardiac Nuclear Scanning A cardiac nuclear scan is used to check your heart for problems, such as the following:  A portion of the heart is not getting enough blood.  Part of the heart muscle has died, which happens with a heart attack.  The heart wall is not working normally.  In this test, a radioactive dye (tracer) is injected into your bloodstream. After the tracer has traveled to your heart, a scanning device is used to measure how much of the tracer is  absorbed by or distributed to various areas of your heart. LET [facility] CARE PROVIDER KNOW ABOUT:  Any allergies you have.  All medicines you are taking, including vitamins, herbs, eye drops, creams, and over-the-counter medicines.  Previous problems you or members of your family have had with the use of anesthetics.  Any blood disorders you have.  Previous surgeries you have  had.  Medical conditions you have.  RISKS AND COMPLICATIONS Generally, this is a safe procedure. However, as with any procedure, problems can occur. Possible problems include:   Serious chest pain.  Rapid heartbeat.  Sensation of warmth in your chest. This usually passes quickly. BEFORE THE PROCEDURE Ask your health care provider about changing or stopping your regular medicines. PROCEDURE This procedure is usually done at a hospital and takes 2-4 hours.  An IV tube is inserted into one of your veins.  Your health care provider will inject a small amount of radioactive tracer through the tube.  You will then wait for 20-40 minutes while the tracer travels through your bloodstream.  You will lie down on an exam table so images of your heart can be taken. Images will be taken for about 15-20 minutes.  You will exercise on a treadmill or stationary bike. While you exercise, your heart activity will be monitored with an electrocardiogram (ECG), and your blood pressure will be checked.  If you are unable to exercise, you may be given a medicine to make your heart beat faster.  When blood flow to your heart has peaked, tracer will again be injected through the IV tube.  After 20-40 minutes, you will get back on the exam table and have more images taken of your heart.  When the procedure is over, your IV tube will be removed. AFTER THE PROCEDURE  You will likely be able to leave shortly after the test. Unless your health care provider tells you otherwise, you may return to your normal schedule, including diet, activities, and medicines.  Make sure you find out how and when you will get your test results. This information is not intended to replace advice given to you by your health care provider. Make sure you discuss any questions you have with your health care provider. Document Released: 04/07/2004 Document Revised: 03/18/2013 Document Reviewed: 02/19/2013 Elsevier Interactive  Patient Education  2017 [employer]. Echocardiogram An echocardiogram, or echocardiography, uses sound waves (ultrasound) to produce an image of your heart. The echocardiogram is simple, painless, obtained within a short period of time, and offers valuable information to your health care provider. The images from an echocardiogram can provide information such as:  Evidence of coronary artery disease (CAD).  Heart size.  Heart muscle function.  Heart valve function.  Aneurysm detection.  Evidence of a past heart attack.  Fluid buildup around the heart.  Heart muscle thickening.  Assess heart valve function. Tell a health care provider about:  Any allergies you have.  All medicines you are taking, including vitamins, herbs, eye drops, creams, and over-the-counter medicines.  Any problems you or family members have had with anesthetic medicines.  Any blood disorders you have.  Any surgeries you have had.  Any medical conditions you have.  Whether you are pregnant or may be pregnant. What happens before the procedure? No special preparation is needed. Eat and drink normally. What happens during the procedure?  In order to produce an image of your heart, gel will be applied to your chest and a wand-like tool (transducer) will be  moved over your chest. The gel will help transmit the sound waves from the transducer. The sound waves will harmlessly bounce off your heart to allow the heart images to be captured in real-time motion. These images will then be recorded.  You may need an IV to receive a medicine that improves the quality of the pictures. What happens after the procedure? You may return to your normal schedule including diet, activities, and medicines, unless your health care provider tells you otherwise. This information is not intended to replace advice given to you by your health care provider. Make sure you discuss any questions you have with your health care  provider. Document Released: 03/10/2000 Document Revised: 10/30/2015 Document Reviewed: 11/18/2012 Elsevier Interactive Patient Education  2017 [employer].

## 2016-03-09 NOTE — Progress Notes (Signed)
Cardiology Office Note   Date:  03/09/2016   ID:  [name], DOB 09/24/1935, MRN [id number]  PCP:  Zachary Body, MD  Cardiologist:   [name], MD  (new)  Chief Complaint  Patient presents with  . other     New Patient. Self referral/afib/seen in ED. Pt c/o "suddenly feeling ill", no energy, nausea. Pt on antibiotics for possible prostate infection.  Reviewed meds with pt verbally.      History of Present Illness: Zachary Green is a 80 y.o. male who was referred from The emergency room at [facility] for evaluation of atrial fibrillation. He has history of BPH and prostate cancer treated with radiation therapy last summer with recurrent urinary tract infection. He went to the emergency room 2 days ago with weakness with associated dizziness. He was found to be in atrial fibrillation on EKG. Chest x-ray showed mild cardiomegaly. CT scan showed several pulmonary nodules with moderate abdominal aortic atherosclerosis. The patient was started on metoprolol and Eliquis. She continues to feel extreme fatigue which limits his physical activities. This is somewhat unusual for him. He denies any chest pain. He continues to complain of dizziness without syncope. He has chronic medical conditions that include hypertension and hyperlipidemia. He is not diabetic. He smoked for 5 years in his 63s. Family history is remarkable for atrial fibrillation but no premature coronary artery disease.    Past Medical History:  Diagnosis Date  . A-fib Zachary Green) 03/07/2016   [facility] ED Dx  . Arthritis   . BPH (benign prostatic hypertrophy)   . Cancer (HCC)    PROSTATE, BASAL CELL AND SQUAMOUS CELL  . Diverticulitis   . ED (erectile dysfunction)   . GERD (gastroesophageal reflux disease)   . Hyperlipidemia   . Hypertension   . Impaired fasting glucose   . Personal history of colonic adenoma 05/23/2007   04/2007 - 6 mm adenoma  . Schatzki's ring    grade 1 varices    Past Surgical History:    Procedure Laterality Date  . COLONOSCOPY    . ESOPHAGOGASTRODUODENOSCOPY    . ESOPHAGOGASTRODUODENOSCOPY    . GREEN LIGHT LASER TURP (TRANSURETHRAL RESECTION OF PROSTATE N/A 08/17/2015   Procedure: GREEN LIGHT LASER TURP (TRANSURETHRAL RESECTION OF PROSTATE;  Surgeon: Royston Cowper, MD;  Location: [facility] ORS;  Service: Urology;  Laterality: N/A;  . NM MYOVIEW LTD    . PATELLA FRACTURE SURGERY     pin placed 1984  . REFRACTIVE SURGERY       Current Outpatient Prescriptions  Medication Sig Dispense Refill  . alfuzosin (UROXATRAL) 10 MG 24 hr tablet Take 10 mg by mouth at bedtime.    Marland Kitchen apixaban (ELIQUIS) 5 MG TABS tablet Take 1 tablet (5 mg total) by mouth 2 (two) times daily. 60 tablet 0  . cephALEXin (KEFLEX) 500 MG capsule Take 1 capsule (500 mg total) by mouth 3 (three) times daily. 21 capsule 0  . docusate sodium (COLACE) 100 MG capsule Take 2 capsules (200 mg total) by mouth 2 (two) times daily. 120 capsule 3  . doxycycline (ADOXA) 50 MG tablet Take 50 mg by mouth daily.    Marland Kitchen esomeprazole (NEXIUM) 40 MG capsule Take 40 mg by mouth every morning.    Marland Kitchen losartan (COZAAR) 50 MG tablet Take 50 mg by mouth every morning.    . Methen-Hyosc-Meth Blue-Na Phos (UROGESIC-BLUE) 81.6 MG TABS Take 1 tablet (81.6 mg total) by mouth every 6 (six) hours as needed. 40 tablet  3  . metoprolol succinate (TOPROL XL) 25 MG 24 hr tablet Take 1 tablet (25 mg total) by mouth daily. 30 tablet 0  . Multiple Vitamin (MULTI-VITAMINS) TABS Take by mouth.    . Multiple Vitamins-Minerals (PRESERVISION AREDS 2 PO) Take 2 tablets by mouth daily.    . pravastatin (PRAVACHOL) 40 MG tablet TAKE ONE TABLET BY MOUTH EVERY DAY (Patient taking differently: TAKE ONE TABLET BY MOUTH EVERY MORNING) 90 tablet 3   No current facility-administered medications for this visit.     Allergies:   Codeine    Social History:  The patient  reports that he has quit smoking. His smoking use included Cigarettes. He has a 7.00 pack-year  smoking history. He has never used smokeless tobacco. He reports that he does not drink alcohol or use drugs.   Family History:  The patient's family history includes Alzheimer's disease in his mother; Diabetes in his brother.    ROS:  Please see the history of present illness.   Otherwise, review of systems are positive for none.   All other systems are reviewed and negative.    PHYSICAL EXAM: VS:  BP 128/78 (BP Location: Right Arm, Patient Position: Sitting, Cuff Size: Normal)   Pulse 78   Ht 5\' 7"  (1.702 m)   Wt 191 lb (86.6 kg)   BMI 29.91 kg/m  , BMI Green mass index is 29.91 kg/m. GEN: Well nourished, well developed, in no acute distress  HEENT: normal  Neck: no JVD, carotid bruits, or masses Cardiac: Irregularly irregular; no murmurs, rubs, or gallops,no edema  Respiratory:  clear to auscultation bilaterally, normal work of breathing GI: soft, nontender, nondistended, + BS MS: no deformity or atrophy  Skin: warm and dry, no rash Neuro:  Strength and sensation are intact Psych: euthymic mood, full affect   EKG:  EKG is ordered today. The ekg ordered today demonstrates atrial fibrillation with ventricular rate of 78 beats per minute.   Recent Labs: 03/07/2016: ALT 24; BUN 13; Creatinine, Ser 0.90; Hemoglobin 15.9; Platelets 174; Potassium 3.7; Sodium 134; TSH 2.632    Lipid Panel    Component Value Date/Time   CHOL 181 11/22/2010 1012   TRIG 82.0 11/22/2010 1012   HDL 54.40 11/22/2010 1012   CHOLHDL 3 11/22/2010 1012   VLDL 16.4 11/22/2010 1012   LDLCALC 110 (H) 11/22/2010 1012   LDLDIRECT 126.2 05/01/2007 0954      Wt Readings from Last 3 Encounters:  03/09/16 191 lb (86.6 kg)  03/07/16 194 lb (88 kg)  08/17/15 184 lb (83.5 kg)      PAD Screen 03/09/2016  Previous PAD dx? No  Previous surgical procedure? No  Pain with walking? No  Feet/toe relief with dangling? No  Painful, non-healing ulcers? No  Extremities discolored? No      ASSESSMENT AND  PLAN:  1.  Newly diagnosed atrial fibrillation: The exact duration is not entirely clear. His ventricular rate is not high and thus it's difficult to determine how much of his symptoms are related to this. I'm going to obtain an echocardiogram to ensure no structural heart abnormalities. Continue treatment with Toprol and anticoagulation with Eliquis. I plan on proceeding with cardioversion after 3 weeks of anticoagulation if he remains in atrial fibrillation.  2. Exertional dyspnea and fatigue: Given his age and risk factors, we have to exclude underlying coronary artery disease. Thus, I requested a pharmacologic nuclear stress test. I don't think he will be able to exercise effectively on a treadmill with  his recent atrial fibrillation.  3. Essential hypertension: Blood pressure is controlled on current medications. Hydrochlorothiazide was discontinued recently after starting metoprolol.  4. Aortic atherosclerosis: The patient has normal pulses and no evidence of obstructive peripheral arterial disease.  5. Hyperlipidemia: Currently on pravastatin.  6. Pulmonary nodules: The patient was advised to follow-up with his primary care physician regarding this.  Disposition:   FU with me in 2 weeks  Signed,  [name], MD  03/09/2016 4:16 PM    [facility]

## 2016-03-13 ENCOUNTER — Ambulatory Visit: Payer: Medicare Other | Admitting: Cardiovascular Disease

## 2016-03-15 ENCOUNTER — Telehealth: Payer: Self-pay | Admitting: *Deleted

## 2016-03-15 NOTE — Telephone Encounter (Signed)
S/w patient. He verbalized understanding of instructions for myoview stress test tomorrow 03/16/16, arrive at 07:45 am.  We went over AVS from last OV with myoview instructions.

## 2016-03-16 ENCOUNTER — Encounter
Admission: RE | Admit: 2016-03-16 | Discharge: 2016-03-16 | Disposition: A | Discharge status: Home / Self Care | Payer: Medicare Other | Source: Ambulatory Visit | Attending: Cardiovascular Disease | Admitting: Cardiovascular Disease

## 2016-03-16 DIAGNOSIS — R0602 Shortness of breath: Secondary | ICD-10-CM | POA: Diagnosis not present

## 2016-03-16 LAB — NM MYOCAR MULTI W/SPECT W/WALL MOTION / EF
CHL CUP MPHR: 142 {beats}/min
CHL CUP NUCLEAR SDS: 1
CSEPEW: 1 METS
Exercise duration (min): 0 min
Exercise duration (sec): 0 s
LVDIAVOL: 54 mL (ref 62–150)
LVSYSVOL: 16 mL
NUC STRESS TID: 1.11
Peak HR: 112 {beats}/min
Percent HR: 78 %
Rest HR: 80 {beats}/min
SRS: 3
SSS: 3

## 2016-03-16 MED ORDER — TECHNETIUM TC 99M TETROFOSMIN IV KIT
30.2200 | PACK | Freq: Once | INTRAVENOUS | Status: AC | PRN
  Administered 2016-03-16: 30.22 via INTRAVENOUS

## 2016-03-16 MED ORDER — REGADENOSON 0.4 MG/5ML IV SOLN
0.4000 mg | Freq: Once | INTRAVENOUS | Status: AC
  Administered 2016-03-16: 0.4 mg via INTRAVENOUS

## 2016-03-16 MED ORDER — TECHNETIUM TC 99M TETROFOSMIN IV KIT
12.9200 | PACK | Freq: Once | INTRAVENOUS | Status: AC | PRN
  Administered 2016-03-16: 12.92 via INTRAVENOUS

## 2016-03-21 ENCOUNTER — Emergency Department: Payer: Medicare Other

## 2016-03-21 ENCOUNTER — Emergency Department
Admission: EM | Admit: 2016-03-21 | Discharge: 2016-03-21 | Disposition: A | Discharge status: Home / Self Care | Payer: Medicare Other | Attending: Emergency Medicine | Admitting: Emergency Medicine

## 2016-03-21 DIAGNOSIS — Z87891 Personal history of nicotine dependence: Secondary | ICD-10-CM | POA: Insufficient documentation

## 2016-03-21 DIAGNOSIS — I10 Essential (primary) hypertension: Secondary | ICD-10-CM | POA: Diagnosis not present

## 2016-03-21 DIAGNOSIS — R300 Dysuria: Secondary | ICD-10-CM | POA: Insufficient documentation

## 2016-03-21 DIAGNOSIS — Z8546 Personal history of malignant neoplasm of prostate: Secondary | ICD-10-CM | POA: Insufficient documentation

## 2016-03-21 DIAGNOSIS — R339 Retention of urine, unspecified: Secondary | ICD-10-CM | POA: Diagnosis present

## 2016-03-21 DIAGNOSIS — R918 Other nonspecific abnormal finding of lung field: Secondary | ICD-10-CM | POA: Insufficient documentation

## 2016-03-21 DIAGNOSIS — Z79899 Other long term (current) drug therapy: Secondary | ICD-10-CM | POA: Diagnosis not present

## 2016-03-21 LAB — COMPREHENSIVE METABOLIC PANEL
ALT: 29 U/L (ref 17–63)
ANION GAP: 6 (ref 5–15)
AST: 22 U/L (ref 15–41)
Albumin: 4.3 g/dL (ref 3.5–5.0)
Alkaline Phosphatase: 39 U/L (ref 38–126)
BUN: 15 mg/dL (ref 6–20)
CHLORIDE: 101 mmol/L (ref 101–111)
CO2: 27 mmol/L (ref 22–32)
CREATININE: 0.93 mg/dL (ref 0.61–1.24)
Calcium: 9.3 mg/dL (ref 8.9–10.3)
GFR calc Af Amer: >60 mL/min (ref >60)
Glucose, Bld: 123 mg/dL — ABNORMAL HIGH (ref 65–99)
POTASSIUM: 4.2 mmol/L (ref 3.5–5.1)
Sodium: 134 mmol/L — ABNORMAL LOW (ref 135–145)
Total Bilirubin: 1.3 mg/dL — ABNORMAL HIGH (ref 0.3–1.2)
Total Protein: 6.5 g/dL (ref 6.5–8.1)

## 2016-03-21 LAB — URINALYSIS, COMPLETE (UACMP) WITH MICROSCOPIC
BACTERIA UA: NONE SEEN
BILIRUBIN URINE: NEGATIVE
GLUCOSE, UA: NEGATIVE mg/dL
HGB URINE DIPSTICK: NEGATIVE
KETONES UR: NEGATIVE mg/dL
LEUKOCYTES UA: NEGATIVE
NITRITE: NEGATIVE
PH: 6 (ref 5.0–8.0)
Protein, ur: NEGATIVE mg/dL
RBC / HPF: NONE SEEN RBC/hpf (ref 0–5)
Specific Gravity, Urine: 1.004 — ABNORMAL LOW (ref 1.005–1.030)
Squamous Epithelial / LPF: NONE SEEN

## 2016-03-21 LAB — CBC WITH DIFFERENTIAL/PLATELET
BASOS ABS: 0 10*3/uL (ref 0–0.1)
BASOS PCT: 0 %
Eosinophils Absolute: 0 10*3/uL (ref 0–0.7)
Eosinophils Relative: 1 %
HEMATOCRIT: 45.1 % (ref 40.0–52.0)
Hemoglobin: 15.4 g/dL (ref 13.0–18.0)
Lymphocytes Relative: 32 %
Lymphs Abs: 1.7 10*3/uL (ref 1.0–3.6)
MCH: 32.3 pg (ref 26.0–34.0)
MCHC: 34 g/dL (ref 32.0–36.0)
MCV: 94.9 fL (ref 80.0–100.0)
MONO ABS: 0.4 10*3/uL (ref 0.2–1.0)
Monocytes Relative: 7 %
NEUTROS ABS: 3.3 10*3/uL (ref 1.4–6.5)
NEUTROS PCT: 60 %
Platelets: 183 10*3/uL (ref 150–440)
RBC: 4.76 MIL/uL (ref 4.40–5.90)
RDW: 14.3 % (ref 11.5–14.5)
WBC: 5.4 10*3/uL (ref 3.8–10.6)

## 2016-03-21 LAB — BRAIN NATRIURETIC PEPTIDE: B NATRIURETIC PEPTIDE 5: 193 pg/mL — AB (ref 0.0–100.0)

## 2016-03-21 LAB — TROPONIN I

## 2016-03-21 MED ORDER — TRAMADOL HCL 50 MG PO TABS: 50.0000 mg | ORAL_TABLET | Freq: Four times a day (QID) | ORAL | 0 refills | Status: AC | PRN

## 2016-03-21 MED ORDER — TRAMADOL HCL 50 MG PO TABS
50.0000 mg | ORAL_TABLET | Freq: Once | ORAL | Status: AC
  Administered 2016-03-21: 50 mg via ORAL

## 2016-03-21 MED ORDER — IOPAMIDOL (ISOVUE-300) INJECTION 61%
75.0000 mL | Freq: Once | INTRAVENOUS | Status: AC | PRN
  Administered 2016-03-21: 75 mL via INTRAVENOUS

## 2016-03-21 MED ORDER — TRAMADOL HCL 50 MG PO TABS
ORAL_TABLET | ORAL | Status: AC
  Administered 2016-03-21: 50 mg via ORAL
  Filled 2016-03-21: qty 1

## 2016-03-21 NOTE — Discharge Instructions (Signed)
I discussed her case with Dr. Harl Bowie the cardiologist on call and also with Dr. Rogers Blocker. Dr. Harl Bowie thinks she would need to follow-up for the cardioversion should be in about a week. Dr. Rogers Blocker says you have follow-up scheduled please keep that. Dr. Rogers Blocker suggested we try perhaps a little bit of altered tramadol to see if that helps with the discomfort. Be careful it might make you woozy. Do not drive on it Do not fall and hurt yourself

## 2016-03-21 NOTE — ED Notes (Signed)
Pt presents to ED via [facility] from home for generalized weakness and urinary problems. Pt states dx with a fib and put on eliquis 2 weeks ago. States urinary issues x 6 weeks. States had a prostate procedure done in July and problems since then. Prostate exam performed by dr Cinda Quest who stated it was not enlarged at this time. Only other change recently is BP med, now taking metoprlol.   Pt states intermittent urinary retention, states "i can't go" "I have to push it out and sit down."   Sees dr Fletcher Anon for cardiac, had stress test and echo done recently. Sees dr Rogers Blocker for urology. Has had urine tests done, no UTI, nothing grew on cultures.   CBG 132, BP 160/100 with EMS.

## 2016-03-21 NOTE — ED Notes (Signed)
Pt ambulatory to toilet to attempt urination. Steady gait noted.

## 2016-03-21 NOTE — ED Provider Notes (Addendum)
[facility] Emergency Department Provider Note   ____________________________________________   First MD Initiated Contact with Patient 03/21/16 1635     (approximate)  I have reviewed the triage vital signs and the nursing notes.   HISTORY  Chief Complaint Urinary Retention and Weakness   HPI Zachary Green is a 80 y.o. male who comes in because he's been feeling weak intermittently. He saw Dr. Fletcher Anon and was found to have rate controlled A. fib. He is supposed to be shocked back into normal sinus rhythm at about 3 weeks. He is on Eliquis in the meantime. He's feeling weak again now. He also complains of dysuria and difficulty passing his water. This has been going on for several weeks. Dr. Rogers Blocker, urology had done focused ultrasound to his prostate and the patient's been having problems since then. He says his PSA is about 1. Patient seen Dr. Rogers Blocker repeatedly and actually has an appointment for tomorrow but didn't think he could wait any longer.  Past Medical History:  Diagnosis Date  . A-fib [facility]) 03/07/2016   [facility] ED Dx  . Arthritis   . BPH (benign prostatic hypertrophy)   . Cancer (HCC)    PROSTATE, BASAL CELL AND SQUAMOUS CELL  . Diverticulitis   . ED (erectile dysfunction)   . GERD (gastroesophageal reflux disease)   . Hyperlipidemia   . Hypertension   . Impaired fasting glucose   . Personal history of colonic adenoma 05/23/2007   04/2007 - 6 mm adenoma  . Schatzki's ring    grade 1 varices    Patient Active Problem List   Diagnosis Date Noted  . Routine general medical examination at a health care facility 11/22/2010  . UNSPEC POLYARTHROPATHY/POLYARTHRITIS SITE UNSPEC 05/17/2010  . OSTEOARTHRITIS 10/29/2007  . IMPAIRED FASTING GLUCOSE 10/29/2007  . Personal history of colonic adenoma 05/23/2007  . BUNION, LEFT FOOT 11/22/2006  . SCHATZKI'S RING 11/16/2006  . HYPERLIPIDEMIA 11/12/2006  . ERECTILE DYSFUNCTION 11/12/2006  . HYPERTENSION  11/12/2006  . DIVERTICULOSIS, COLON 11/12/2006  . BENIGN PROSTATIC HYPERTROPHY 11/12/2006    Past Surgical History:  Procedure Laterality Date  . COLONOSCOPY    . ESOPHAGOGASTRODUODENOSCOPY    . ESOPHAGOGASTRODUODENOSCOPY    . GREEN LIGHT LASER TURP (TRANSURETHRAL RESECTION OF PROSTATE N/A 08/17/2015   Procedure: GREEN LIGHT LASER TURP (TRANSURETHRAL RESECTION OF PROSTATE;  Surgeon: Royston Cowper, MD;  Location: [facility] ORS;  Service: Urology;  Laterality: N/A;  . NM MYOVIEW LTD    . PATELLA FRACTURE SURGERY     pin placed 1984  . REFRACTIVE SURGERY      Prior to Admission medications   Medication Sig Start Date End Date Taking? Authorizing Provider  alfuzosin (UROXATRAL) 10 MG 24 hr tablet Take 10 mg by mouth at bedtime.    Historical Provider, MD  apixaban (ELIQUIS) 5 MG TABS tablet Take 1 tablet (5 mg total) by mouth 2 (two) times daily. 03/07/16   Orbie Pyo, MD  docusate sodium (COLACE) 100 MG capsule Take 2 capsules (200 mg total) by mouth 2 (two) times daily. 08/17/15   Royston Cowper, MD  doxycycline (ADOXA) 50 MG tablet Take 50 mg by mouth daily.    Historical Provider, MD  esomeprazole (NEXIUM) 40 MG capsule Take 40 mg by mouth every morning.    Historical Provider, MD  losartan (COZAAR) 50 MG tablet Take 50 mg by mouth every morning.    Historical Provider, MD  Methen-Hyosc-Meth Blue-Na Phos (UROGESIC-BLUE) 81.6 MG TABS Take 1  tablet (81.6 mg total) by mouth every 6 (six) hours as needed. 08/17/15   Royston Cowper, MD  metoprolol succinate (TOPROL XL) 25 MG 24 hr tablet Take 1 tablet (25 mg total) by mouth daily. 03/07/16 03/07/17  Orbie Pyo, MD  Multiple Vitamin (MULTI-VITAMINS) TABS Take by mouth.    Historical Provider, MD  Multiple Vitamins-Minerals (PRESERVISION AREDS 2 PO) Take 2 tablets by mouth daily.    Historical Provider, MD  pravastatin (PRAVACHOL) 40 MG tablet TAKE ONE TABLET BY MOUTH EVERY DAY Patient taking differently: TAKE ONE TABLET  BY MOUTH EVERY MORNING 10/08/11   Venia Carbon, MD  traMADol (ULTRAM) 50 MG tablet Take 1 tablet (50 mg total) by mouth every 6 (six) hours as needed. 03/21/16 03/21/17  Nena Polio, MD    Allergies Codeine  Family History  Problem Relation Age of Onset  . Alzheimer's disease Mother   . Diabetes Brother     Social History Social History  Substance Use Topics  . Smoking status: Former Smoker    Packs/day: 1.00    Years: 7.00    Types: Cigarettes  . Smokeless tobacco: Never Used  . Alcohol use No    Review of Systems Constitutional: No fever/chills Eyes: No visual changes. ENT: No sore throat. Cardiovascular: Denies chest pain. Respiratory: Denies shortness of breath. Gastrointestinal:  abdominal pain (lower)  No nausea, no vomiting.  No diarrhea.  No constipation. Genitourinary:dysuria. Musculoskeletal: Negative for back pain. Skin: Negative for rash. Neurological: Negative for headaches, focal weakness or numbness.  10-point ROS otherwise negative.  ____________________________________________   PHYSICAL EXAM:  VITAL SIGNS: ED Triage Vitals  Enc Vitals Group     BP 03/21/16 1635 (!) 160/100     Pulse Rate 03/21/16 1635 82     Resp 03/21/16 1635 (!) 21     Temp 03/21/16 1635 98 F (36.7 C)     Temp Source 03/21/16 1635 Oral     SpO2 03/21/16 1635 94 %     Weight 03/21/16 1636 191 lb (86.6 kg)     Height 03/21/16 1636 5\' 7"  (1.702 m)     Head Circumference --      Peak Flow --      Pain Score 03/21/16 1636 7     Pain Loc --      Pain Edu? --      Excl. in [place]? --     Constitutional: Alert and oriented. Well appearing and in no acute distress. Eyes: Conjunctivae are normal. PERRL. EOMI. Head: Atraumatic. Nose: No congestion/rhinnorhea. Mouth/Throat: Mucous membranes are moist.  Oropharynx non-erythematous. Neck: No stridor.  Cardiovascular: Normal rate, regular rhythm. Grossly normal heart sounds.  Good peripheral circulation. Respiratory:  Normal respiratory effort.  No retractions. Lungs CTAB. Gastrointestinal: Soft and nontenderShe reports lower abdomen is painful but the pain is not changed by palpation.. No distention. No abdominal bruits. No CVA tenderness. Rectal: Normal rectal tone small nontender prostate normal brown stool  Musculoskeletal: No lower extremity tenderness nor edema.  No joint effusions. Neurologic:  Normal speech and language. No gross focal neurologic deficits are appreciated. No gait instability. Skin:  Skin is warm, dry and intact. No rash noted.   ____________________________________________   LABS (all labs ordered are listed, but only abnormal results are displayed)  Labs Reviewed  COMPREHENSIVE METABOLIC PANEL - Abnormal; Notable for the following:       Result Value   Sodium 134 (*)    Glucose, Bld 123 (*)  Total Bilirubin 1.3 (*)    All other components within normal limits  BRAIN NATRIURETIC PEPTIDE - Abnormal; Notable for the following:    B Natriuretic Peptide 193.0 (*)    All other components within normal limits  URINALYSIS, COMPLETE (UACMP) WITH MICROSCOPIC - Abnormal; Notable for the following:    Color, Urine STRAW (*)    APPearance CLEAR (*)    Specific Gravity, Urine 1.004 (*)    All other components within normal limits  TROPONIN I  CBC WITH DIFFERENTIAL/PLATELET   ____________________________________________  EKG  EKG read and interpreted by me shows A. fib at a rate of 84 normal axis nonspecific ST-T wave changes irregular baseline ____________________________________________  RADIOLOGY  Study Result   CLINICAL DATA:  Generalized weakness, urinary problems, atrial fibrillation, hypertension, prostate cancer  EXAM: PORTABLE CHEST 1 VIEW  COMPARISON:  Portable exam 1649 hours compared to 03/07/2016  FINDINGS: Upper normal heart size.  Mediastinal contours and pulmonary vascularity.  Lungs appear hyperinflated but clear.  No infiltrate, pleural  effusion, or pneumothorax.  Diffuse osseous demineralization.  IMPRESSION: Question mild emphysematous changes.  No acute abnormalities.   Electronically Signed   By: Lavonia Dana M.D.   On: 03/21/2016 17:21   tudy Result   CLINICAL DATA:  Lung nodules noted on CT of the abdomen and pelvis performed 03/07/2016. Generalized weakness. Initial encounter.  EXAM: CT CHEST WITH CONTRAST  TECHNIQUE: Multidetector CT imaging of the chest was performed during intravenous contrast administration.  CONTRAST:  82mL ISOVUE-300 IOPAMIDOL (ISOVUE-300) INJECTION 61%  COMPARISON:  CT of the abdomen and pelvis from 03/07/2016  FINDINGS: Cardiovascular: The heart is normal in size. Scattered coronary artery calcifications are seen. Mild calcification is noted along the aortic arch and descending thoracic aorta. The great vessels are unremarkable in appearance.  Mediastinum/Nodes: No mediastinal lymphadenopathy is seen. No pericardial effusion is identified. The thyroid gland is grossly unremarkable in appearance. No axillary lymphadenopathy is appreciated  Lungs/Pleura: Scattered pulmonary nodules are noted at the right middle and lower lobes, measuring up to 7 mm in size. The lungs are otherwise clear bilaterally. No pleural effusion or pneumothorax is seen.  Upper Abdomen: The visualized portions of the liver and spleen are unremarkable. The visualized portions of the pancreas, adrenal glands and kidneys are within normal limits.  Musculoskeletal: No acute osseous abnormalities are identified. Anterior osteophytes are noted along the lower thoracic spine. The visualized musculature is unremarkable in appearance.  IMPRESSION: 1. No acute abnormality seen within the chest. 2. **An incidental finding of potential clinical significance has been found. Scattered pulmonary nodules at the right middle and lower lung lobes, measuring up to 7 mm in size. Non-contrast  chest CT at 3-6 months is recommended. If the nodules are stable at time of repeat CT, then future CT at 18-24 months (from today's scan) is considered optional for low-risk patients, but is recommended for high-risk patients. This recommendation follows the consensus statement: Guidelines for Management of Incidental Pulmonary Nodules Detected on CT Images: From the Fleischner Society 2017; Radiology 2017; 284:228-243. ** 3. Scattered coronary artery calcifications seen.   Electronically Signed   By: Garald Balding M.D.   On: 03/21/2016 18:33     ____________________________________________   PROCEDURES  Procedure(s) performed:   Procedures  Critical Care performed:   ____________________________________________   INITIAL IMPRESSION / ASSESSMENT AND PLAN / ED COURSE  Pertinent labs & imaging results that were available during my care of the patient were reviewed by me and considered in my  medical decision making (see chart for details).  Myoview from 12/21 this year: Sudy Result    There was no ST segment deviation noted during stress.  The study is normal.  This is a low risk study.  The left ventricular ejection fraction is normal (55-65%).     Study Result   CLINICAL DATA:  Generalized weakness, urinary problems, atrial fibrillation, hypertension, prostate cancer  EXAM: PORTABLE CHEST 1 VIEW  COMPARISON:  Portable exam 1649 hours compared to 03/07/2016  FINDINGS: Upper normal heart size.  Mediastinal contours and pulmonary vascularity.  Lungs appear hyperinflated but clear.  No infiltrate, pleural effusion, or pneumothorax.  Diffuse osseous demineralization.  IMPRESSION: Question mild emphysematous changes.  No acute abnormalities.   Electronically Signed   By: Lavonia Dana M.D.   On: 03/21/2016 17:21   Study Result 03/09/16 ECHO  Result status: Final result                           *[address]                        [place], [place] [address]                            [id number]  ------------------------------------------------------------------- Transthoracic Echocardiography  Patient:    Yordan, Warzecha MR #:       [id number] Study Date: 03/09/2016 Gender:     M Age:        5 Height:     170.2 cm Weight:     86.6 kg BSA:        2.05 m^2 Pt. Status: Room:   ATTENDING    [name], MD  Nuckolls, MD  [place], MD  PERFORMING   Catano, Portia  SONOGRAPHER  Pilar Jarvis, RVT, RDCS, RDMS  cc:  ------------------------------------------------------------------- LV EF: 55% -   60%  ------------------------------------------------------------------- History:   PMH:  Cardiomegaly diagnosed via CXR.  Aortic valve disease.  Risk factors:  Hypertension. Dyslipidemia.  ------------------------------------------------------------------- Study Conclusions  - Left ventricle: The cavity size was normal. There was moderate   concentric hypertrophy. Systolic function was normal. The   estimated ejection fraction was in the range of 55% to 60%. Wall   motion was normal; there were no regional wall motion   abnormalities. - Aortic valve: There was trivial regurgitation. - Mitral valve: Systolic bowing without prolapse. There was   moderate regurgitation. - Left atrium: The atrium was mildly dilated. - Right atrium: The atrium was mildly dilated. - Tricuspid valve: There was moderate regurgitation. - Pulmonary arteries: Systolic pressure was moderately increased.   PA peak pressure: 50 mm Hg (S).  ------------------------------------------------------------------- Study data:  No prior study was available for comparison.  Study status:  Routine.  Procedure:  Transthoracic echocardiography. Image quality was fair. The study was technically difficult, as a result of poor acoustic  windows.  Study completion:  There were no complications.          Transthoracic echocardiography.  M-mode, complete 2D, spectral Doppler, and color Doppler.  Birthdate: Patient birthdate: 1935-08-09.  Age:  Patient is 80 yr old.  Sex: Gender: male.    BMI: 29.9 kg/m^2.  Blood  pressure:     138/78 Patient status:  Outpatient.  Study date:  Study date: 03/09/2016. Study time: 03:28 PM.  -------------------------------------------------------------------  ------------------------------------------------------------------- Left ventricle:  The cavity size was normal. There was moderate concentric hypertrophy. Systolic function was normal. The estimated ejection fraction was in the range of 55% to 60%. Wall motion was normal; there were no regional wall motion abnormalities. The study was not technically sufficient to allow evaluation of LV diastolic dysfunction due to atrial fibrillation.  ------------------------------------------------------------------- Aortic valve:   Trileaflet; normal thickness leaflets. Mobility was not restricted.  Doppler:  Transvalvular velocity was within the normal range. There was no stenosis. There was trivial regurgitation.    VTI ratio of LVOT to aortic valve: 1.11. Valve area (VTI): 4.22 cm^2. Indexed valve area (VTI): 2.06 cm^2/m^2. Peak velocity ratio of LVOT to aortic valve: 0.97. Valve area (Vmax): 3.69 cm^2. Indexed valve area (Vmax): 1.8 cm^2/m^2. Mean velocity ratio of LVOT to aortic valve: 0.93. Valve area (Vmean): 3.53 cm^2. Indexed valve area (Vmean): 1.72 cm^2/m^2.    Mean gradient (S): 2 mm Hg. Peak gradient (S): 4 mm Hg.  ------------------------------------------------------------------- Aorta:  Aortic root: The aortic root was normal in size.  ------------------------------------------------------------------- Mitral valve:  Mobility was not restricted.  Systolic bowing without prolapse.  Doppler:  Transvalvular velocity was within  the normal range. There was no evidence for stenosis. There was moderate regurgitation.    Peak gradient (D): 7 mm Hg.  ------------------------------------------------------------------- Left atrium:  The atrium was mildly dilated.  ------------------------------------------------------------------- Right ventricle:  The cavity size was normal. Wall thickness was normal. Systolic function was normal.  ------------------------------------------------------------------- Pulmonic valve:    Doppler:  Transvalvular velocity was within the normal range. There was no evidence for stenosis.  ------------------------------------------------------------------- Tricuspid valve:   Structurally normal valve.    Doppler: Transvalvular velocity was within the normal range. There was moderate regurgitation.  ------------------------------------------------------------------- Pulmonary artery:   The main pulmonary artery was normal-sized. Systolic pressure was moderately increased.  ------------------------------------------------------------------- Right atrium:  The atrium was mildly dilated.  ------------------------------------------------------------------- Pericardium:  There was no pericardial effusion.  ------------------------------------------------------------------- Systemic veins: Inferior vena cava: Poorly visualized. The vessel was normal in size.  ------------------------------------------------------------------- Measurements   Left ventricle                           Value          Reference  LV ID, ED, PLAX chordal          (L)     36.9  mm       43 - 52  LV ID, ES, PLAX chordal                  23.2  mm       23 - 38  LV fx shortening, PLAX chordal           37    %        >=29  LV PW thickness, ED                      13.2  mm       ----------  IVS/LV PW ratio, ED                      1.14           <=1.3  Stroke volume, 2D  73    ml        ----------  Stroke volume/bsa, 2D                    36    ml/m^2   ----------  LV ejection fraction, 1-p A4C            54    %        ----------  LV e&', lateral                           8.92  cm/s     ----------  LV E/e&', lateral                         15.13          ----------  LV e&', medial                            7.62  cm/s     ----------  LV E/e&', medial                          17.72          ----------  LV e&', average                           8.27  cm/s     ----------  LV E/e&', average                         16.32          ----------    Ventricular septum                       Value          Reference  IVS thickness, ED                        15.1  mm       ----------    LVOT                                     Value          Reference  LVOT ID, S                               22    mm       ----------  LVOT area                                3.8   cm^2     ----------  LVOT ID                                  22    mm       ----------  LVOT peak velocity, S                    100   cm/s     ----------  LVOT mean velocity, S                    63.5  cm/s     ----------  LVOT VTI, S                              19.2  cm       ----------  Stroke volume (SV), LVOT DP              73    ml       ----------  Stroke index (SV/bsa), LVOT DP           35.6  ml/m^2   ----------    Aortic valve                             Value          Reference  Aortic valve peak velocity, S            103   cm/s     ----------  Aortic valve mean velocity, S            68.3  cm/s     ----------  Aortic valve VTI, S                      17.3  cm       ----------  Aortic mean gradient, S                  2     mm Hg    ----------  Aortic peak gradient, S                  4     mm Hg    ----------  VTI ratio, LVOT/AV                       1.11           ----------  Aortic valve area, VTI                   4.22  cm^2     ----------  Aortic valve area/bsa, VTI               2.06  cm^2/m^2  ----------  Velocity ratio, peak, LVOT/AV            0.97           ----------  Aortic valve area, peak velocity         3.69  cm^2     ----------  Aortic valve area/bsa, peak              1.8   cm^2/m^2 ----------  velocity  Velocity ratio, mean, LVOT/AV            0.93           ----------  Aortic valve area, mean velocity         3.53  cm^2     ----------  Aortic valve area/bsa, mean              1.72  cm^2/m^2 ----------  velocity    Aorta  Value          Reference  Aortic root ID, ED                       36    mm       ----------  Ascending aorta ID, A-P, S               33    mm       ----------    Left atrium                              Value          Reference  LA ID, A-P, ES                           41    mm       ----------  LA ID/bsa, A-P                           2     cm/m^2   <=2.2  LA volume, ES, 1-p A4C                   63.1  ml       ----------  LA volume/bsa, ES, 1-p A4C               30.8  ml/m^2   ----------  LA volume, ES, 1-p A2C                   81.7  ml       ----------  LA volume/bsa, ES, 1-p A2C               39.9  ml/m^2   ----------    Mitral valve                             Value          Reference  Mitral E-wave peak velocity              135   cm/s     ----------  Mitral deceleration time         (L)     148   ms       150 - 230  Mitral peak gradient, D                  7     mm Hg    ----------    Pulmonary arteries                       Value          Reference  PA pressure, S, DP               (H)     50    mm Hg    <=30    Tricuspid valve                          Value          Reference  Tricuspid regurg peak velocity           309   cm/s     ----------  Tricuspid peak RV-RA gradient            38    mm Hg    ----------    Right atrium                             Value          Reference  RA ID, S-I, ES, A4C              (H)     61.5  mm       34 - 49  RA area, ES, A4C                 (H)     20.7  cm^2      8.3 - 19.5  RA volume, ES, A/L                       56.2  ml       ----------  RA volume/bsa, ES, A/L                   27.4  ml/m^2   ----------    Right ventricle                          Value          Reference  TAPSE                                    27.7  mm       ----------  RV s&', lateral, S                        15.8  cm/s     ----------    Pulmonic valve                           Value          Reference  Pulmonic valve peak velocity, S          62.6  cm/s     ----------  Legend: (L)  and  (H)  mark values outside specified reference range.  ------------------------------------------------------------------- Prepared and Electronically Authenticated by  [name], MD 2017-12-14T17:00:07  PACS Images   12/17 CT ABD:  Study Result   CLINICAL DATA:  Lower abdomen pain, dizziness, new onset atrial fibrillation  EXAM: CT ABDOMEN AND PELVIS WITH CONTRAST  TECHNIQUE: Multidetector CT imaging of the abdomen and pelvis was performed using the standard protocol following bolus administration of intravenous contrast.  CONTRAST:  187mL ISOVUE-300 IOPAMIDOL (ISOVUE-300) INJECTION 61%  COMPARISON:  None.  FINDINGS: Lower chest: There are several noncalcified nodules in the right lower lobe the largest measuring 8 mm in diameter. No definite nodule is seen within the left lower lobe. The heart is within normal limits in size.  Hepatobiliary: The liver enhances with no focal abnormality in the liver is somewhat small in size. No calcified gallstones are noted.  Pancreas: The pancreas is unremarkable and the pancreatic duct is not dilated.  Spleen: The spleen is within normal limits in size.  Adrenals/Urinary Tract: The adrenal glands are symmetrical with no abnormality noted. The kidneys enhance with no calculus or mass and on delayed images, the pelvocaliceal  systems are unremarkable. The ureters are normal in caliber. The urinary bladder is  not optimally distended. There are at least 2 urinary bladder diverticula present the larger being anteriorly and superiorly positions.  Stomach/Bowel: The stomach is largely decompressed. No small bowel dilatation is seen. The rectosigmoid colon is somewhat elongated and there are multiple rectosigmoid colon diverticula. No diverticulitis is seen. The terminal ileum and the appendix are unremarkable.  Vascular/Lymphatic: The abdominal aorta is normal in caliber with moderate abdominal aortic atherosclerosis present. No adenopathy is seen.  Reproductive: The prostate is normal in size for age.  Other: None  Musculoskeletal: Hypertrophic bone formation around the posterior left ischial ramus probably represents previous trauma with healing. The lumbar vertebrae are normal alignment with degenerative disc disease at L2-3 and L4-5 levels.  IMPRESSION: 1. Several lung nodules at the right lung base. Cannot exclude metastatic involvement. Consider CT of the chest. 2. At least 2 urinary bladder diverticula. 3. Multiple rectosigmoid colon diverticula.  No diverticulitis. 4. Moderate abdominal aortic atherosclerosis.   Electronically Signed   By: Ivar Drape M.D.   On: 03/07/2016 16:10      Clinical Course    Patient urinates and has bladder scan there is no residual urine  ----------------------------------------- 5:45 PM on 03/21/2016 -----------------------------------------  Patient says he still having burning and pressure when he tries to urinate and has to work hard to get it out. He has tried Pyridium as well as the other urinary anesthetics and work. Awaiting for Dr. Joneen Caraway did call back. He is not on call anymore so might be that he will call back in which case necessary will be on call.  Discussed  With Dr. Harl Bowie, cardiology, he reviewed Chart and all the studies. He feels that there is nothing going on with cardiology at this point. He recommends following  up with Dr. Fletcher Anon as planned. We'll call Dr. Rogers Blocker.  Discussed patient with Dr. Rogers Blocker. He has extensively evaluated the patient. He is not sure what to do next. He suggests try some tramadol to see if that helps with the dysuria. I will do this. Patient will follow-up with Dr. Rogers Blocker and later with Dr. Doristine Section has an appointment with Dr.Arida later this week.  ____________________________________________   FINAL CLINICAL IMPRESSION(S) / ED DIAGNOSES  Final diagnoses:  Dysuria      NEW MEDICATIONS STARTED DURING THIS VISIT:  Discharge Medication List as of 03/21/2016  7:43 PM    START taking these medications   Details  traMADol (ULTRAM) 50 MG tablet Take 1 tablet (50 mg total) by mouth every 6 (six) hours as needed., Starting Tue 03/21/2016, Until Wed 03/21/2017, Print         Note:  This document was prepared using Dragon voice recognition software and may include unintentional dictation errors.    Nena Polio, MD 03/22/16 [id number]    Nena Polio, MD 03/22/16 [phone]

## 2016-03-23 ENCOUNTER — Ambulatory Visit (INDEPENDENT_AMBULATORY_CARE_PROVIDER_SITE_OTHER): Payer: Medicare Other | Admitting: Physician Assistant

## 2016-03-23 ENCOUNTER — Other Ambulatory Visit: Payer: Self-pay | Admitting: Cardiovascular Disease

## 2016-03-23 ENCOUNTER — Encounter: Payer: Self-pay | Admitting: Physician Assistant

## 2016-03-23 VITALS — BP 120/80 | HR 80 | Ht 67.0 in | Wt 190.5 lb

## 2016-03-23 DIAGNOSIS — I4891 Unspecified atrial fibrillation: Secondary | ICD-10-CM

## 2016-03-23 DIAGNOSIS — R0602 Shortness of breath: Secondary | ICD-10-CM

## 2016-03-23 DIAGNOSIS — I481 Persistent atrial fibrillation: Secondary | ICD-10-CM

## 2016-03-23 DIAGNOSIS — R531 Weakness: Secondary | ICD-10-CM | POA: Diagnosis not present

## 2016-03-23 DIAGNOSIS — R110 Nausea: Secondary | ICD-10-CM

## 2016-03-23 DIAGNOSIS — E782 Mixed hyperlipidemia: Secondary | ICD-10-CM

## 2016-03-23 DIAGNOSIS — Z01818 Encounter for other preprocedural examination: Secondary | ICD-10-CM

## 2016-03-23 DIAGNOSIS — I4819 Other persistent atrial fibrillation: Secondary | ICD-10-CM

## 2016-03-23 MED ORDER — AMIODARONE HCL 200 MG PO TABS: ORAL_TABLET | ORAL | 5 refills | Status: DC

## 2016-03-23 MED ORDER — AMIODARONE HCL 200 MG PO TABS: ORAL_TABLET | ORAL | 3 refills | Status: DC

## 2016-03-23 NOTE — Patient Instructions (Addendum)
Medication Instructions:  Your physician has recommended you make the following change in your medication:  1- START Amiodarone 400mg  (2 tablets) by mouth twice a day for 1 week, THEN amiodarone 200 mg (1 tablet) by mouth twice a day for 1 week, THEN amiodarone 200 mg (1 tablet) by mouth once a day.   Labwork: Your physician recommends that you return for lab work in: TODAY (BMP, CBC, PT/INR).   Testing/Procedures: Your physician has recommended that you have a Cardioversion (DCCV). Electrical Cardioversion uses a jolt of electricity to your heart either through paddles or wired patches attached to your chest. This is a controlled, usually prescheduled, procedure. Defibrillation is done under light anesthesia in the hospital, and you usually go home the day of the procedure. This is done to get your heart back into a normal rhythm. You are not awake for the procedure. Please see the instruction sheet given to you today.  You are scheduled for a Cardioversion on 03/28/16 with Dr. Rockey Situ. Please arrive at the [facility] at 06:30 AM on the day of your procedure.  DIET INSTRUCTIONS:  Nothing to eat or drink after midnight except your medications with a sip of water.         1) Labs: DONE TODAY.  2) Medications:  YOU MAY TAKE ALL of your remaining medications with a small amount of water.  3) Must have a responsible person to drive you home.  4) Bring a current list of your medications and current insurance cards.    If you have any questions after you get home, please call the office at [phone]   Follow-Up: Your physician recommends that you schedule a follow-up appointment in: [place] April 04, 2016.     Any Other Special Instructions Will Be Listed Below (If Applicable).  Electrical Cardioversion Electrical cardioversion is the delivery of a jolt of electricity to restore a normal rhythm to the heart. A rhythm that is too fast  or is not regular keeps the heart from pumping well. In this procedure, sticky patches or metal paddles are placed on the chest to deliver electricity to the heart from a device. This procedure may be done in an emergency if:  There is low or no blood pressure as a result of the heart rhythm.  Normal rhythm must be restored as fast as possible to protect the brain and heart from further damage.  It may save a life. This procedure may also be done for irregular or fast heart rhythms that are not immediately life-threatening. Tell a health care provider about:  Any allergies you have.  All medicines you are taking, including vitamins, herbs, eye drops, creams, and over-the-counter medicines.  Any problems you or family members have had with anesthetic medicines.  Any blood disorders you have.  Any surgeries you have had.  Any medical conditions you have.  Whether you are pregnant or may be pregnant. What are the risks? Generally, this is a safe procedure. However, problems may occur, including:  Allergic reactions to medicines.  A blood clot that breaks free and travels to other parts of your body.  The possible return of an abnormal heart rhythm within hours or days after the procedure.  Your heart stopping (cardiac arrest). This is rare. What happens before the procedure? Medicines  Your health care provider may have you start taking:  Blood-thinning medicines (anticoagulants) so your blood does not clot as easily.  Medicines  may be given to help stabilize your heart rate and rhythm.  Ask your health care provider about changing or stopping your regular medicines. This is especially important if you are taking diabetes medicines or blood thinners. General instructions  Plan to have someone take you home from the hospital or clinic.  If you will be going home right after the procedure, plan to have someone with you for 24 hours.  Follow instructions from your health  care provider about eating or drinking restrictions. What happens during the procedure?  To lower your risk of infection:  Your health care team will wash or sanitize their hands.  Your skin will be washed with soap.  An IV tube will be inserted into one of your veins.  You will be given a medicine to help you relax (sedative).  Sticky patches (electrodes) or metal paddles may be placed on your chest.  An electrical shock will be delivered. The procedure may vary among health care providers and hospitals. What happens after the procedure?  Your blood pressure, heart rate, breathing rate, and blood oxygen level will be monitored until the medicines you were given have worn off.  Do not drive for 24 hours if you were given a sedative.  Your heart rhythm will be watched to make sure it does not change. This information is not intended to replace advice given to you by your health care provider. Make sure you discuss any questions you have with your health care provider. Document Released: 03/03/2002 Document Revised: 11/10/2015 Document Reviewed: 09/17/2015 Elsevier Interactive Patient Education  2017 [employer].     If you need a refill on your cardiac medications before your next appointment, please call your pharmacy.

## 2016-03-23 NOTE — Progress Notes (Addendum)
Cardiology Office Note Date:  03/23/2016  Patient ID:  Zachary Green [birth year], MRN [id number] PCP:  Zachary Body, MD  Cardiologist:  Dr. Fletcher Anon, MD    Chief Complaint: Follow up Afib  History of Present Illness: Zachary Green is a 80 y.o. male with history of PAF diagnosed mid December 2017 on Eliquis, BPH and prostate cancer treated with radiation therapy with recurrent UTI, HTN, HLD, prior tobacco abuse x 5 years in his 20's, Schatzki's ring with grade 1 varices, and family history of Afib but no premature CAD who presents for routine follow up of his Afib. Patient was initally seen by Dr. Fletcher Anon on 03/09/16 after visiting the ED on 12/12 for weakness and dizziness. He was found to be in new onset Afib on EKG. CXR showed mild cardiomegaly. CT scan showed several pulmonary nodules with moderate abdominal aortic atherosclerosis. He was started on metoprolol and Eliquis. At his visit with Dr. Fletcher Anon on 12/14 he continued to note extreme fatigue, limiting his physical activities. Echo on 12/14 showed an EF of 55-60%, no RWMA, trivial AI, moderate MR with systolic bowing without prolapse, moderate TR, mild biatrial enlargement, PASP 50 mmHg. He underwent nuclear stress testing on 12/21 that was negative for evidence of significant ischemia, EF 55-65%, low-risk study.  Continues to note intermittent fatigue, exertional dyspnea, generalized malaise, urinary pressure and dysuria along with spikes in his blood pressure. Does not feel palpitations. Has not missed any doses of Eliquis. Heart rate ranging from the 80's to 1-teens at home. No LE swelling. Urology is ruling out pheochromocytoma. Following up with PCP. Interested in pharmacological Afib treatment initially over DCCV.    Past Medical History:  Diagnosis Date  . A-fib [facility]) 03/07/2016   [facility] ED Dx  . Arthritis   . BPH (benign prostatic hypertrophy)   . Cancer (HCC)    PROSTATE, BASAL CELL AND SQUAMOUS CELL  . Diverticulitis    . ED (erectile dysfunction)   . GERD (gastroesophageal reflux disease)   . Hyperlipidemia   . Hypertension   . Impaired fasting glucose   . Personal history of colonic adenoma 05/23/2007   04/2007 - 6 mm adenoma  . Schatzki's ring    grade 1 varices    Past Surgical History:  Procedure Laterality Date  . COLONOSCOPY    . ESOPHAGOGASTRODUODENOSCOPY    . ESOPHAGOGASTRODUODENOSCOPY    . GREEN LIGHT LASER TURP (TRANSURETHRAL RESECTION OF PROSTATE N/A 08/17/2015   Procedure: GREEN LIGHT LASER TURP (TRANSURETHRAL RESECTION OF PROSTATE;  Surgeon: Royston Cowper, MD;  Location: [facility] ORS;  Service: Urology;  Laterality: N/A;  . NM MYOVIEW LTD    . PATELLA FRACTURE SURGERY     pin placed 1984  . REFRACTIVE SURGERY      Current Outpatient Prescriptions  Medication Sig Dispense Refill  . alfuzosin (UROXATRAL) 10 MG 24 hr tablet Take 10 mg by mouth at bedtime.    Marland Kitchen apixaban (ELIQUIS) 5 MG TABS tablet Take 1 tablet (5 mg total) by mouth 2 (two) times daily. 60 tablet 0  . docusate sodium (COLACE) 100 MG capsule Take 2 capsules (200 mg total) by mouth 2 (two) times daily. 120 capsule 3  . doxycycline (ADOXA) 50 MG tablet Take 50 mg by mouth daily.    Marland Kitchen esomeprazole (NEXIUM) 40 MG capsule Take 40 mg by mouth every morning.    Marland Kitchen losartan (COZAAR) 50 MG tablet Take 50 mg by mouth every morning.    . Methen-Hyosc-Meth Blue-Na Phos (  UROGESIC-BLUE) 81.6 MG TABS Take 1 tablet (81.6 mg total) by mouth every 6 (six) hours as needed. 40 tablet 3  . metoprolol succinate (TOPROL XL) 25 MG 24 hr tablet Take 1 tablet (25 mg total) by mouth daily. 30 tablet 0  . Multiple Vitamin (MULTI-VITAMINS) TABS Take by mouth.    . Multiple Vitamins-Minerals (PRESERVISION AREDS 2 PO) Take 2 tablets by mouth daily.    . pravastatin (PRAVACHOL) 40 MG tablet TAKE ONE TABLET BY MOUTH EVERY DAY (Patient taking differently: TAKE ONE TABLET BY MOUTH EVERY MORNING) 90 tablet 3  . traMADol (ULTRAM) 50 MG tablet Take 1 tablet (50  mg total) by mouth every 6 (six) hours as needed. 20 tablet 0   No current facility-administered medications for this visit.     Allergies:   Codeine   Social History:  The patient  reports that he has quit smoking. His smoking use included Cigarettes. He has a 7.00 pack-year smoking history. He has never used smokeless tobacco. He reports that he does not drink alcohol or use drugs.   Family History:  The patient's family history includes Alzheimer's disease in his mother; Diabetes in his brother.  ROS:   Review of Systems  Constitutional: Positive for malaise/fatigue. Negative for chills, diaphoresis, fever and weight loss.  HENT: Negative for congestion.   Eyes: Negative for discharge and redness.  Respiratory: Positive for shortness of breath. Negative for cough, hemoptysis, sputum production and wheezing.   Cardiovascular: Negative for chest pain, palpitations, orthopnea, claudication, leg swelling and PND.  Gastrointestinal: Negative for abdominal pain, blood in stool, heartburn, melena, nausea and vomiting.  Genitourinary: Negative for hematuria.  Musculoskeletal: Negative for falls and myalgias.  Skin: Negative for rash.  Neurological: Positive for dizziness and weakness. Negative for tingling, tremors, sensory change, speech change, focal weakness and loss of consciousness.  Endo/Heme/Allergies: Does not bruise/bleed easily.  Psychiatric/Behavioral: Negative for substance abuse. The patient is not nervous/anxious.   All other systems reviewed and are negative.    PHYSICAL EXAM:  VS:  BP 120/80 (BP Location: Left Arm, Patient Position: Sitting, Cuff Size: Normal)   Pulse 80   Ht 5\' 7"  (1.702 m)   Wt 190 lb 8 oz (86.4 kg)   BMI 29.84 kg/m  BMI: Green mass index is 29.84 kg/m.  Physical Exam  Constitutional: He is oriented to person, place, and time. He appears well-developed and well-nourished.  HENT:  Head: Normocephalic and atraumatic.  Eyes: Right eye exhibits no  discharge. Left eye exhibits no discharge.  Neck: Normal range of motion. No JVD present.  Cardiovascular: Normal rate, S1 normal, S2 normal and normal heart sounds.  An irregularly irregular rhythm present. Exam reveals no distant heart sounds, no friction rub, no midsystolic click and no opening snap.   No murmur heard. Pulmonary/Chest: Effort normal and breath sounds normal. No respiratory distress. He has no decreased breath sounds. He has no wheezes. He has no rales. He exhibits no tenderness.  Abdominal: Soft. He exhibits no distension. There is no tenderness.  Musculoskeletal: He exhibits no edema.  Neurological: He is alert and oriented to person, place, and time.  Skin: Skin is warm and dry. No cyanosis. Nails show no clubbing.  Psychiatric: He has a normal mood and affect. His speech is normal and behavior is normal. Judgment and thought content normal.     EKG:  Was ordered and interpreted by me today. Shows Afib, 80 bpm, no acute st/t changes  Recent Labs: 03/07/2016: TSH 2.632  03/21/2016: ALT 29; B Natriuretic Peptide 193.0; BUN 15; Creatinine, Ser 0.93; Hemoglobin 15.4; Platelets 183; Potassium 4.2; Sodium 134  No results found for requested labs within last 8760 hours.   Estimated Creatinine Clearance: 68.7 mL/min (by C-G formula based on SCr of 0.93 mg/dL).   Wt Readings from Last 3 Encounters:  03/23/16 190 lb 8 oz (86.4 kg)  03/21/16 191 lb (86.6 kg)  03/09/16 191 lb (86.6 kg)     Other studies reviewed: Additional studies/records reviewed today include: summarized above  ASSESSMENT AND PLAN:  1. PAF: Remains in Afib with heart rates in the 80's to 1-teens and heart rate today of 80 bpm. Treatment options were discussed with the patient and his son in detail. They are interested in pharmacotherapy initially over DCCV initially. Discussed antiarrhythmic medications in detail. Recent echo with normal EF and low risk stress test. Given his persistent symptoms of  exertional fatigue, generalized malaise, and SOB I cannot rule out possible false negative stress test, though suspect these symptoms may be more related to his Afib. Given that, will avoid flecainide at this time and start amiodarone 400 mg bid x 1 week, then 200 mg bid x 1 week, then 200 mg daily thereafter. Recent cmet and tsh reviewed. If he does not convert to sinus rhythm with this he will proceed with DCCV on 03/29/16. He will come to the office for EKG only on 1/2 to assess rhythm. Continue Toprol XL 25 mg daily. Continue Eliquis 5 mg bid (has not missed any doses). CHADS2VASc at least 3 (HTN, age x 2).   2. Exertional fatigue/dyspnea: As above. Upon restoration of sinus rhythm should his exertional symptoms persist consider cardiac cath. Remaining work up per PCP.   3. HTN: Controlled today. Continue current medications.   4. HLD: Continue pravastatin.   Disposition: F/u with me in 2 weeks.   Current medicines are reviewed at length with the patient today.  The patient did not have any concerns regarding medicines.  Melvern Banker PA-C 03/23/2016 3:10 PM     [address] [place], [place] [id number] [phone]

## 2016-03-24 LAB — CBC WITH DIFFERENTIAL/PLATELET
BASOS ABS: 0 10*3/uL (ref 0.0–0.2)
BASOS: 0 %
EOS (ABSOLUTE): 0 10*3/uL (ref 0.0–0.4)
Eos: 1 %
Hematocrit: 44.4 % (ref 37.5–51.0)
Hemoglobin: 15.3 g/dL (ref 13.0–17.7)
IMMATURE GRANULOCYTES: 0 %
Immature Grans (Abs): 0 10*3/uL (ref 0.0–0.1)
LYMPHS: 28 %
Lymphocytes Absolute: 1.3 10*3/uL (ref 0.7–3.1)
MCH: 31.6 pg (ref 26.6–33.0)
MCHC: 34.5 g/dL (ref 31.5–35.7)
MCV: 92 fL (ref 79–97)
MONOS ABS: 0.3 10*3/uL (ref 0.1–0.9)
Monocytes: 6 %
NEUTROS PCT: 65 %
Neutrophils Absolute: 3.1 10*3/uL (ref 1.4–7.0)
PLATELETS: 193 10*3/uL (ref 150–379)
RBC: 4.84 x10E6/uL (ref 4.14–5.80)
RDW: 14.2 % (ref 12.3–15.4)
WBC: 4.8 10*3/uL (ref 3.4–10.8)

## 2016-03-24 LAB — BASIC METABOLIC PANEL
BUN / CREAT RATIO: 15 (ref 10–24)
BUN: 16 mg/dL (ref 8–27)
CALCIUM: 9.3 mg/dL (ref 8.6–10.2)
CHLORIDE: 99 mmol/L (ref 96–106)
CO2: 22 mmol/L (ref 18–29)
Creatinine, Ser: 1.05 mg/dL (ref 0.76–1.27)
GFR calc non Af Amer: 68 mL/min/{1.73_m2} (ref >59)
GFR, EST AFRICAN AMERICAN: 78 mL/min/{1.73_m2} (ref >59)
GLUCOSE: 117 mg/dL — AB (ref 65–99)
POTASSIUM: 4.3 mmol/L (ref 3.5–5.2)
Sodium: 138 mmol/L (ref 134–144)

## 2016-03-24 LAB — PROTIME-INR
INR: 1 (ref 0.8–1.2)
Prothrombin Time: 10.8 s (ref 9.1–12.0)

## 2016-03-28 ENCOUNTER — Encounter
Admission: RE | Disposition: A | Discharge status: Home / Self Care | Payer: Self-pay | Source: Ambulatory Visit | Attending: Cardiovascular Disease

## 2016-03-28 ENCOUNTER — Other Ambulatory Visit: Payer: Self-pay | Admitting: Cardiovascular Disease

## 2016-03-28 ENCOUNTER — Ambulatory Visit
Admission: RE | Admit: 2016-03-28 | Discharge: 2016-03-28 | Disposition: A | Discharge status: Home / Self Care | Payer: Medicare Other | Source: Ambulatory Visit | Attending: Cardiovascular Disease | Admitting: Cardiovascular Disease

## 2016-03-28 ENCOUNTER — Encounter: Payer: Self-pay | Admitting: *Deleted

## 2016-03-28 ENCOUNTER — Ambulatory Visit: Payer: Medicare Other | Admitting: Anesthesiology

## 2016-03-28 DIAGNOSIS — Z8546 Personal history of malignant neoplasm of prostate: Secondary | ICD-10-CM | POA: Insufficient documentation

## 2016-03-28 DIAGNOSIS — I481 Persistent atrial fibrillation: Secondary | ICD-10-CM

## 2016-03-28 DIAGNOSIS — E785 Hyperlipidemia, unspecified: Secondary | ICD-10-CM | POA: Diagnosis not present

## 2016-03-28 DIAGNOSIS — Z79899 Other long term (current) drug therapy: Secondary | ICD-10-CM | POA: Diagnosis not present

## 2016-03-28 DIAGNOSIS — I10 Essential (primary) hypertension: Secondary | ICD-10-CM | POA: Insufficient documentation

## 2016-03-28 DIAGNOSIS — Z87891 Personal history of nicotine dependence: Secondary | ICD-10-CM | POA: Diagnosis not present

## 2016-03-28 DIAGNOSIS — I4891 Unspecified atrial fibrillation: Secondary | ICD-10-CM | POA: Diagnosis present

## 2016-03-28 DIAGNOSIS — K219 Gastro-esophageal reflux disease without esophagitis: Secondary | ICD-10-CM | POA: Insufficient documentation

## 2016-03-28 DIAGNOSIS — N400 Enlarged prostate without lower urinary tract symptoms: Secondary | ICD-10-CM | POA: Insufficient documentation

## 2016-03-28 HISTORY — PX: ELECTROPHYSIOLOGIC STUDY: SHX172A

## 2016-03-28 SURGERY — CARDIOVERSION (CATH LAB)
Anesthesia: General

## 2016-03-28 MED ORDER — PROPOFOL 10 MG/ML IV BOLUS
INTRAVENOUS | Status: AC
  Filled 2016-03-28: qty 20

## 2016-03-28 MED ORDER — SODIUM CHLORIDE 0.9 % IV SOLN
INTRAVENOUS | Status: DC
  Administered 2016-03-28 (×2): via INTRAVENOUS

## 2016-03-28 MED ORDER — PROPOFOL 10 MG/ML IV BOLUS
INTRAVENOUS | Status: DC | PRN
  Administered 2016-03-28: 70 mg via INTRAVENOUS

## 2016-03-28 NOTE — Discharge Instructions (Signed)
Electrical Cardioversion, Care After °This sheet gives you information about how to care for yourself after your procedure. Your health care provider may also give you more specific instructions. If you have problems or questions, contact your health care provider. °What can I expect after the procedure? °After the procedure, it is common to have: °· Some redness on the skin where the shocks were given. °Follow these instructions at home: °· Do not drive for 24 hours if you were given a medicine to help you relax (sedative). °· Take over-the-counter and prescription medicines only as told by your health care provider. °· Ask your health care provider how to check your pulse. Check it often. °· Rest for 48 hours after the procedure or as told by your health care provider. °· Avoid or limit your caffeine use as told by your health care provider. °Contact a health care provider if: °· You feel like your heart is beating too quickly or your pulse is not regular. °· You have a serious muscle cramp that does not go away. °Get help right away if: °· You have discomfort in your chest. °· You are dizzy or you feel faint. °· You have trouble breathing or you are short of breath. °· Your speech is slurred. °· You have trouble moving an arm or leg on one side of your body. °· Your fingers or toes turn cold or blue. °This information is not intended to replace advice given to you by your health care provider. Make sure you discuss any questions you have with your health care provider. °Document Released: 01/01/2013 Document Revised: 10/15/2015 Document Reviewed: 09/17/2015 °Elsevier Interactive Patient Education © 2017 Elsevier Inc. ° °

## 2016-03-28 NOTE — Anesthesia Procedure Notes (Signed)
Date/Time: 03/28/2016 7:24 AM Performed by: Nelda Marseille Pre-anesthesia Checklist: Patient identified, Emergency Drugs available, Suction available, Patient being monitored and Timeout performed Oxygen Delivery Method: Nasal cannula

## 2016-03-28 NOTE — Anesthesia Postprocedure Evaluation (Signed)
Anesthesia Post Note  Patient: Zachary Green  Procedure(s) Performed: Procedure(s) (LRB): CARDIOVERSION (N/A)  Patient location during evaluation: Cath Lab Anesthesia Type: General Level of consciousness: awake and alert Pain management: pain level controlled Vital Signs Assessment: post-procedure vital signs reviewed and stable Respiratory status: spontaneous breathing, nonlabored ventilation, respiratory function stable and patient connected to nasal cannula oxygen Cardiovascular status: blood pressure returned to baseline and stable Postop Assessment: no signs of nausea or vomiting Anesthetic complications: no     Last Vitals:  Vitals:   03/28/16 0800 03/28/16 0830  BP: 107/64 113/73  Pulse: (!) 47 (!) 51  Resp: 10 15  Temp:      Last Pain:  Vitals:   03/28/16 0830  PainSc: 0-No pain                 Precious Haws Airiana Elman

## 2016-03-28 NOTE — Anesthesia Preprocedure Evaluation (Addendum)
Anesthesia Evaluation  Patient identified by MRN, date of birth, ID band Patient awake    Reviewed: Allergy & Precautions, H&P , NPO status , Patient's Chart, lab work & pertinent test results  History of Anesthesia Complications Negative for: history of anesthetic complications  Airway Mallampati: III  TM Distance: <3 FB Neck ROM: limited    Dental  (+) Poor Dentition, Chipped, Missing, Upper Dentures   Pulmonary neg shortness of breath, former smoker,    Pulmonary exam normal breath sounds clear to auscultation       Cardiovascular Exercise Tolerance: Good hypertension, (-) angina(-) Past MI and (-) DOE + dysrhythmias Atrial Fibrillation  Rhythm:irregular Rate:Normal     Neuro/Psych negative neurological ROS  negative psych ROS   GI/Hepatic Neg liver ROS, GERD  Controlled,  Endo/Other  negative endocrine ROS  Renal/GU negative Renal ROS  negative genitourinary   Musculoskeletal  (+) Arthritis ,   Abdominal   Peds  Hematology negative hematology ROS (+)   Anesthesia Other Findings Past Medical History: 03/07/2016: A-fib ([place])     Comment: [facility] ED Dx No date: Arthritis No date: Atrial fibrillation (HCC) No date: BPH (benign prostatic hypertrophy) No date: Cancer ([place])     Comment: PROSTATE, BASAL CELL AND SQUAMOUS CELL No date: Diverticulitis No date: ED (erectile dysfunction) No date: GERD (gastroesophageal reflux disease) No date: Hyperlipidemia No date: Hypertension No date: Impaired fasting glucose 05/23/2007: Personal history of colonic adenoma     Comment: 04/2007 - 6 mm adenoma No date: Schatzki's ring     Comment: grade 1 varices  Past Surgical History: No date: COLONOSCOPY No date: ESOPHAGOGASTRODUODENOSCOPY No date: ESOPHAGOGASTRODUODENOSCOPY 08/17/2015: GREEN LIGHT LASER TURP (TRANSURETHRAL RESECTIO* N/A     Comment: Procedure: GREEN LIGHT LASER TURP               (TRANSURETHRAL RESECTION  OF PROSTATE;  Surgeon:              Royston Cowper, MD;  Location: [facility] ORS;                Service: Urology;  Laterality: N/A; No date: NM MYOVIEW LTD No date: PATELLA FRACTURE SURGERY     Comment: pin placed 1984 No date: REFRACTIVE SURGERY  BMI    Body Mass Index:  29.91 kg/m      Reproductive/Obstetrics negative OB ROS                            Anesthesia Physical Anesthesia Plan  ASA: III  Anesthesia Plan: General   Post-op Pain Management:    Induction:   Airway Management Planned:   Additional Equipment:   Intra-op Plan:   Post-operative Plan:   Informed Consent: I have reviewed the patients History and Physical, chart, labs and discussed the procedure including the risks, benefits and alternatives for the proposed anesthesia with the patient or authorized representative who has indicated his/her understanding and acceptance.   Dental Advisory Given  Plan Discussed with: Anesthesiologist, CRNA and Surgeon  Anesthesia Plan Comments:         Anesthesia Quick Evaluation

## 2016-03-28 NOTE — Transfer of Care (Signed)
Immediate Anesthesia Transfer of Care Note  Patient: Zachary Green  Procedure(s) Performed: Procedure(s): CARDIOVERSION (N/A)  Patient Location: PACU  Anesthesia Type:General  Level of Consciousness: sedated  Airway & Oxygen Therapy: Patient Spontanous Breathing and Patient connected to nasal cannula oxygen  Post-op Assessment: Report given to RN and Post -op Vital signs reviewed and stable  Post vital signs: Reviewed and stable  Last Vitals:  Vitals:   03/28/16 0633 03/28/16 0715  BP: 124/81   Pulse:  67  Resp: 18 13  Temp: 36.8 C     Last Pain: There were no vitals filed for this visit.       Complications: No apparent anesthesia complications

## 2016-03-28 NOTE — CV Procedure (Signed)
Cardioversion procedure note For atrial fibrillation, persistent.  Procedure Details:  Consent: Risks of procedure as well as the alternatives and risks of each were explained to the (patient/caregiver). Consent for procedure obtained.  Time Out: Verified patient identification, verified procedure, site/side was marked, verified correct patient position, special equipment/implants available, medications/allergies/relevent history reviewed, required imaging and test results available. Performed  Patient placed on cardiac monitor, pulse oximetry, supplemental oxygen as necessary.  Sedation given: propofol IV, Dr. Corrie Mckusick Pacer pads placed anterior and posterior chest.   Cardioverted 1 time(s).  Cardioverted at 150  J. Synchronized biphasic Converted to NSR   Evaluation: Findings: Post procedure EKG shows: NSR Complications: None Patient did tolerate procedure well.  Time Spent Directly with the Patient:  40 minutes   Esmond Plants, M.D., Ph.D.

## 2016-03-29 ENCOUNTER — Other Ambulatory Visit: Payer: Self-pay

## 2016-03-29 ENCOUNTER — Telehealth: Payer: Self-pay | Admitting: Cardiovascular Disease

## 2016-03-29 ENCOUNTER — Encounter: Payer: Self-pay | Admitting: Cardiovascular Disease

## 2016-03-29 DIAGNOSIS — I4891 Unspecified atrial fibrillation: Secondary | ICD-10-CM

## 2016-03-29 MED ORDER — METOPROLOL SUCCINATE ER 25 MG PO TB24: 25.0000 mg | ORAL_TABLET | Freq: Every day | ORAL | 3 refills | Status: DC

## 2016-03-29 MED ORDER — APIXABAN 5 MG PO TABS: 5.0000 mg | ORAL_TABLET | Freq: Two times a day (BID) | ORAL | 3 refills | Status: DC

## 2016-03-29 NOTE — Telephone Encounter (Signed)
Pt son calling stating today pt is stating he is having some symptoms of irregular heartbeat  Just wanted to touch bases with [place] Please advise

## 2016-03-29 NOTE — Telephone Encounter (Signed)
Pt's son, Louie Casa, reports pt has irregular pulse today. He had DCCV on 03/28/16 and has continued amiodarone as prescribed. Pulse appeared to be regular yesterday after the procedure. Pt is asymptomatic but according to son, is anxious he is back in afib.  Scheduled pt for EKG tomorrow @ 9am in our office.  Louie Casa will notify patient.

## 2016-03-30 ENCOUNTER — Ambulatory Visit (INDEPENDENT_AMBULATORY_CARE_PROVIDER_SITE_OTHER): Payer: Medicare Other | Admitting: *Deleted

## 2016-03-30 VITALS — BP 138/72 | HR 58 | Ht 67.0 in | Wt 192.2 lb

## 2016-03-30 DIAGNOSIS — I10 Essential (primary) hypertension: Secondary | ICD-10-CM | POA: Diagnosis not present

## 2016-03-30 DIAGNOSIS — I4891 Unspecified atrial fibrillation: Secondary | ICD-10-CM

## 2016-03-30 NOTE — Patient Instructions (Addendum)
1.) Reason for visit: Recent DCCV on 03/28/16. Patient concern he was still having irregular heartbeat. Came in for EKG check.  2.) Name of MD requesting visit:  [name], PA  3.) H&P: hx a fib with recent DCCV on 03/28/16.  4.) ROS related to problem: Patient reports feeling fatigued 1-2 times a day after doing activities such as running errands. He will sit or lay down to rest for 15 min then feels better. Denies palpitations/fluttering, chest pain, dizziness, or SOB.  5.) Assessment and plan per MD:  EKG performed. EKG showed Sinus bradycardia. Showed to Dr Rockey Situ who advised for patient to continue current medications with no changes. Patient has f/u 04/10/16 with Laurine Blazer.  Patient verbalized understanding along with his son who was present.

## 2016-04-04 ENCOUNTER — Telehealth: Payer: Self-pay | Admitting: Physician Assistant

## 2016-04-04 ENCOUNTER — Ambulatory Visit: Payer: BLUE CROSS/BLUE SHIELD

## 2016-04-04 NOTE — Telephone Encounter (Signed)
Pt states he has a cold, and would like to know what he can take OTC for this. Please call and advise.

## 2016-04-04 NOTE — Telephone Encounter (Signed)
S/w patient's wife, ok per DPR. Advised that patient should not take any medications for cold containing a "D" or "DM" at the end of the name. Suggested Tylenol should be fine as well. Also, advised patient could also speak to his pharmacist and also call PCP if symptoms do not improve. She verbalized understanding.

## 2016-04-06 ENCOUNTER — Ambulatory Visit: Payer: Medicare Other | Admitting: Cardiovascular Disease

## 2016-04-10 ENCOUNTER — Encounter: Payer: Self-pay | Admitting: Physician Assistant

## 2016-04-10 ENCOUNTER — Ambulatory Visit: Payer: Medicare Other | Admitting: Physician Assistant

## 2016-04-13 ENCOUNTER — Ambulatory Visit: Payer: Medicare Other | Admitting: Physician Assistant

## 2016-04-25 ENCOUNTER — Encounter: Payer: Self-pay | Admitting: Physician Assistant

## 2016-04-25 ENCOUNTER — Ambulatory Visit (INDEPENDENT_AMBULATORY_CARE_PROVIDER_SITE_OTHER): Payer: Medicare Other | Admitting: Physician Assistant

## 2016-04-25 VITALS — BP 142/68 | HR 53 | Ht 67.0 in | Wt 190.5 lb

## 2016-04-25 DIAGNOSIS — I4819 Other persistent atrial fibrillation: Secondary | ICD-10-CM

## 2016-04-25 DIAGNOSIS — I481 Persistent atrial fibrillation: Secondary | ICD-10-CM | POA: Diagnosis not present

## 2016-04-25 DIAGNOSIS — I10 Essential (primary) hypertension: Secondary | ICD-10-CM | POA: Diagnosis not present

## 2016-04-25 DIAGNOSIS — E782 Mixed hyperlipidemia: Secondary | ICD-10-CM | POA: Diagnosis not present

## 2016-04-25 DIAGNOSIS — R0602 Shortness of breath: Secondary | ICD-10-CM | POA: Diagnosis not present

## 2016-04-25 DIAGNOSIS — Z79899 Other long term (current) drug therapy: Secondary | ICD-10-CM | POA: Diagnosis not present

## 2016-04-25 DIAGNOSIS — I5032 Chronic diastolic (congestive) heart failure: Secondary | ICD-10-CM | POA: Insufficient documentation

## 2016-04-25 DIAGNOSIS — R5383 Other fatigue: Secondary | ICD-10-CM | POA: Diagnosis not present

## 2016-04-25 DIAGNOSIS — Z9889 Other specified postprocedural states: Secondary | ICD-10-CM

## 2016-04-25 MED ORDER — AMIODARONE HCL 100 MG PO TABS: 100.0000 mg | ORAL_TABLET | Freq: Every day | ORAL | 3 refills | Status: DC

## 2016-04-25 MED ORDER — FUROSEMIDE 20 MG PO TABS: 20.0000 mg | ORAL_TABLET | Freq: Every day | ORAL | 3 refills | Status: DC

## 2016-04-25 NOTE — Patient Instructions (Addendum)
Medication Instructions:  Your physician has recommended you make the following change in your medication:  1- START Furosemide 20 mg  By mouth once a day. 2- DECREASE Amiodarone to 100 mg by mouth once a day.   Labwork: Your physician recommends that you return for lab work in: 7-8 [place]. ON OR AROUND February 7TH OR 8TH, 2018 IN THE OFFICE.   Testing/Procedures: - None ordered.   Follow-Up: Your physician recommends that you schedule a follow-up appointment in: 4-6 WEEKS.   If you need a refill on your cardiac medications before your next appointment, please call your pharmacy.

## 2016-04-25 NOTE — Progress Notes (Signed)
Cardiology Office Note Date:  04/25/2016  Patient ID:  Zachary Green 07/04/1935, MRN [id number] PCP:  [name], MD  Cardiologist:  Dr. Fletcher Anon, MD    Chief Complaint: Follow up Afib  History of Present Illness: Zachary Green is a 81 y.o. male with history of persistent Afib diagnosed mid December 2017 on Eliquis s/p successful DCCV on [id number], chronic diastolic CHF, BPH and prostate cancer treated with radiation therapy with recurrent UTI, HTN, HLD, prior tobacco abuse x 5 years in his 20's, Schatzki's ring with grade 1 varices, and family history of Afib but no premature CAD who presents for routine follow up of his Afib.   Patient was initally seen by Dr. Fletcher Anon on 03/09/16 after visiting the ED on 12/12 for weakness and dizziness. He was found to be in new onset Afib on EKG. CXR showed mild cardiomegaly. CT scan showed several pulmonary nodules with moderate abdominal aortic atherosclerosis. He was started on metoprolol and Eliquis. At his visit with Dr. Fletcher Anon on 12/14 he continued to note extreme fatigue, limiting his physical activities. Echo on 12/14 showed an EF of 55-60%, no RWMA, trivial AI, moderate MR with systolic bowing without prolapse, moderate TR, mild biatrial enlargement, PASP 50 mmHg. He underwent nuclear stress testing on 12/21 that was negative for evidence of significant ischemia, EF 55-65%, low-risk study. In follow up on 12/28 he continued to note intermittent fatigue, exertional dyspnea, generalized malaise, urinary pressure and dysuria along with spike in his BP. He had not missed any doses of his Eliquis. He remained in rate-controlled Afib. He wanted to try pharmacological cardioversion initially prior to undergoing DCCV. He was started on amiodarone loading dose (recent cmet and tsh were reviewed). He did not convert with medication and underwent successful DCCV on 03/28/2016 with one synchronized cardioverrsion at 150 J. Patient called on 1/3 concerned he  was back in Afib. Underwent EKG on 1/4 that showed sinus bradycardia. He was continued on current medications.   He is doing well today. Still notes some exertional fatigue and SOB. Son is here with him today who states since about October 2017 the patient has slowed down significantly. Never with chest pain. At rest breathing is at his baseline. Tolerating all medications without issues. Has not missed any doses of medications. Drinks less than 64 oz daily. No palpitations. BP at home mostly in the [id number] systolic range with a rare reading in the 0000000 systolic if dealing with urinary complaints. Heart rate at home has been running in the low to mid 50's bpm. Still dealing with urinary issues. Sees urology regularly.    Past Medical History:  Diagnosis Date  . Arthritis   . BPH (benign prostatic hypertrophy)   . Cancer (HCC)    PROSTATE, BASAL CELL AND SQUAMOUS CELL  . Chronic diastolic CHF (congestive heart failure) ([place])    a. echo 12/17: 55-60%, no RWMA, trivial AI, moderate MR with systolic bowing without prolapse, moderate TR, mild biatrial enlargement, PASP 50 mmHg  . Diverticulitis   . ED (erectile dysfunction)   . GERD (gastroesophageal reflux disease)   . Hyperlipidemia   . Hypertension   . Impaired fasting glucose   . Persistent atrial fibrillation (HCC)    a. on Eliquis, amiodarone, Toprol; b. s/p successful dccv 03/28/16; c. CHADS2VASc at least 3 (HTN, age x 2)  . Personal history of colonic adenoma 05/23/2007   04/2007 - 6 mm adenoma  . Schatzki's ring    grade 1 varices  Past Surgical History:  Procedure Laterality Date  . COLONOSCOPY    . ELECTROPHYSIOLOGIC STUDY N/A 03/28/2016   Procedure: CARDIOVERSION;  Surgeon: Minna Merritts, MD;  Location: [facility] ORS;  Service: Cardiovascular;  Laterality: N/A;  . ESOPHAGOGASTRODUODENOSCOPY    . ESOPHAGOGASTRODUODENOSCOPY    . GREEN LIGHT LASER TURP (TRANSURETHRAL RESECTION OF PROSTATE N/A 08/17/2015   Procedure: GREEN LIGHT LASER  TURP (TRANSURETHRAL RESECTION OF PROSTATE;  Surgeon: Royston Cowper, MD;  Location: [facility] ORS;  Service: Urology;  Laterality: N/A;  . NM MYOVIEW LTD    . PATELLA FRACTURE SURGERY     pin placed 1984  . REFRACTIVE SURGERY      Current Outpatient Prescriptions  Medication Sig Dispense Refill  . alfuzosin (UROXATRAL) 10 MG 24 hr tablet Take 10 mg by mouth at bedtime.    Marland Kitchen amiodarone (PACERONE) 200 MG tablet Take 400mg (2 tab) by mouth twice a day for 1 week, then take 200mg (1 tab) by mouth twice a day for 1 week, then take 200mg  once a day. (Patient taking differently: 200 mg 2 (two) times daily. Take 400mg (2 tab) by mouth twice a day for 1 week, then take 200mg (1 tab) by mouth twice a day for 1 week, then take 200mg  once a day.) 60 tablet 5  . apixaban (ELIQUIS) 5 MG TABS tablet Take 1 tablet (5 mg total) by mouth 2 (two) times daily. 60 tablet 3  . docusate sodium (COLACE) 100 MG capsule Take 2 capsules (200 mg total) by mouth 2 (two) times daily. 120 capsule 3  . esomeprazole (NEXIUM) 20 MG capsule Take 20 mg by mouth 2 (two) times daily before a meal.    . losartan (COZAAR) 50 MG tablet Take 50 mg by mouth every morning.    . metoprolol succinate (TOPROL XL) 25 MG 24 hr tablet Take 1 tablet (25 mg total) by mouth daily. 30 tablet 3  . Multiple Vitamin (MULTI-VITAMINS) TABS Take 1 tablet by mouth daily.     . Multiple Vitamins-Minerals (PRESERVISION AREDS 2 PO) Take 2 tablets by mouth daily.    . Omega-3 Fatty Acids (FISH OIL PO) Take 1 capsule by mouth 2 (two) times daily.    [name] Glycol-Propyl Glycol (SYSTANE OP) Place 1 drop into both eyes daily as needed (dry eyes).    . pravastatin (PRAVACHOL) 40 MG tablet TAKE ONE TABLET BY MOUTH EVERY DAY (Patient taking differently: TAKE ONE TABLET BY MOUTH EVERY EVENING) 90 tablet 3  . traMADol (ULTRAM) 50 MG tablet Take 1 tablet (50 mg total) by mouth every 6 (six) hours as needed. (Patient taking differently: Take 50 mg by mouth every 6 (six)  hours as needed for moderate pain. ) 20 tablet 0   No current facility-administered medications for this visit.     Allergies:   Codeine   Social History:  The patient  reports that he has quit smoking. His smoking use included Cigarettes. He has a 7.00 pack-year smoking history. He has never used smokeless tobacco. He reports that he does not drink alcohol or use drugs.   Family History:  The patient's family history includes Alzheimer's disease in his mother; Diabetes in his brother.  ROS:   Review of Systems  Constitutional: Positive for malaise/fatigue. Negative for chills, diaphoresis, fever and weight loss.  HENT: Negative for congestion.   Eyes: Negative for discharge and redness.  Respiratory: Positive for shortness of breath. Negative for cough, hemoptysis, sputum production and wheezing.   Cardiovascular: Negative for chest pain, palpitations,  orthopnea, claudication, leg swelling and PND.  Gastrointestinal: Negative for abdominal pain, blood in stool, heartburn, melena, nausea and vomiting.  Genitourinary: Positive for dysuria, frequency and urgency. Negative for hematuria.  Musculoskeletal: Negative for falls and myalgias.  Skin: Negative for rash.  Neurological: Positive for weakness. Negative for dizziness, tingling, tremors, sensory change, speech change, focal weakness and loss of consciousness.  Endo/Heme/Allergies: Does not bruise/bleed easily.  Psychiatric/Behavioral: Negative for substance abuse. The patient is not nervous/anxious.   All other systems reviewed and are negative.    PHYSICAL EXAM:  VS:  BP (!) 142/68 (BP Location: Left Arm, Patient Position: Sitting, Cuff Size: Normal)   Pulse (!) 53   Ht 5\' 7"  (1.702 m)   Wt 190 lb 8 oz (86.4 kg)   BMI 29.84 kg/m  BMI: Body mass index is 29.84 kg/m.  Physical Exam  Constitutional: He is oriented to person, place, and time. He appears well-developed and well-nourished.  HENT:  Head: Normocephalic and  atraumatic.  Eyes: Right eye exhibits no discharge. Left eye exhibits no discharge.  Neck: Normal range of motion. No JVD present.  Cardiovascular: Regular rhythm, S1 normal, S2 normal and normal heart sounds.  Bradycardia present.  Exam reveals no distant heart sounds, no friction rub, no midsystolic click and no opening snap.   No murmur heard. Pulmonary/Chest: Effort normal and breath sounds normal. No respiratory distress. He has no decreased breath sounds. He has no wheezes. He has no rales. He exhibits no tenderness.  Abdominal: Soft. He exhibits no distension. There is no tenderness.  Musculoskeletal: He exhibits no edema.  Neurological: He is alert and oriented to person, place, and time.  Skin: Skin is warm and dry. No cyanosis. Nails show no clubbing.  Psychiatric: He has a normal mood and affect. His speech is normal and behavior is normal. Judgment and thought content normal.     EKG:  Was ordered and interpreted by me today. Shows sinus bradycardia, 53 bpm, 1st degree AV block, no acute st/t changes   Recent Labs: 03/07/2016: TSH 2.632 03/21/2016: ALT 29; B Natriuretic Peptide 193.0; Hemoglobin 15.4 03/23/2016: BUN 16; Creatinine, Ser 1.05; Platelets 193; Potassium 4.3; Sodium 138    Wt Readings from Last 3 Encounters:  04/25/16 190 lb 8 oz (86.4 kg)  03/30/16 192 lb 4 oz (87.2 kg)  03/28/16 191 lb (86.6 kg)     Other studies reviewed: Additional studies/records reviewed today include: summarized above  ASSESSMENT AND PLAN:  1. Persistent Afib s/p successful DCCV 03/28/2016: Currently in sinus rhythm with a bradycardic heart rate in the 50's bpm. Decrease amiodarone to 100 mg daily. Continue Toprol XL 25 mg daily. Continue Eliquis 5 mg bid. CHADS2VASc at least 3 (HTN, age x 2).   2. Exertional fatigue/dyspnea: States he is still noting exertional fatigue and SOB, then later states he has not really been that exertional this winter given the cold weather. Symptoms are  likely multifactorial including deconditioning given sedentary lifestyle at this time as well as pulmonary hypertension. Cannot rule out ischemia with a possible false-negative stress test. Discussed option of LHC (after he has completed 4 weeks of uninterrupted anticoagulation given his DCCV) along with RHC to evaluate for high-risk stenosis along with right heart pressures. The patient and his son would like to have a trial of diuresis first along with gradual reintroduction of some exertion then revisit possible R/LHC at next follow up.   3. Chronic systolic CHF: He does not appear to be volume overloaded. Will  start Lasix 20 mg daily today given elevated right-heart pressures seen on echo. Follow up bmet in 7 days. Could consider RHC after a trial of diuresis ong with possible LHC as above. Continue Toprol XL. CHF diet discussed in detail.    4. HTN: Modestly controlled at home per HPI. Add Lasix as above. Continue losartan 50 mg and Toprol XL 25 mg daily.   5. HLD: LDL of 94 on 10/19/2015. Pravastatin per PCP.   Disposition: F/u with me in 4 weeks.   Current medicines are reviewed at length with the patient today.  The patient did not have any concerns regarding medicines.  Melvern Banker PA-C 04/25/2016 2:17 PM     [address] [place], [place] [address] [phone]

## 2016-04-27 ENCOUNTER — Other Ambulatory Visit: Payer: Self-pay | Admitting: Family Medicine

## 2016-04-27 DIAGNOSIS — R911 Solitary pulmonary nodule: Secondary | ICD-10-CM

## 2016-05-05 ENCOUNTER — Other Ambulatory Visit (INDEPENDENT_AMBULATORY_CARE_PROVIDER_SITE_OTHER): Payer: Medicare Other

## 2016-05-05 DIAGNOSIS — I4819 Other persistent atrial fibrillation: Secondary | ICD-10-CM

## 2016-05-05 DIAGNOSIS — I10 Essential (primary) hypertension: Secondary | ICD-10-CM

## 2016-05-05 DIAGNOSIS — I481 Persistent atrial fibrillation: Secondary | ICD-10-CM

## 2016-05-05 DIAGNOSIS — Z9889 Other specified postprocedural states: Secondary | ICD-10-CM

## 2016-05-05 DIAGNOSIS — Z79899 Other long term (current) drug therapy: Secondary | ICD-10-CM

## 2016-05-05 DIAGNOSIS — E782 Mixed hyperlipidemia: Secondary | ICD-10-CM

## 2016-05-09 LAB — BASIC METABOLIC PANEL
BUN / CREAT RATIO: 11 (ref 10–24)
BUN: 10 mg/dL (ref 8–27)
CALCIUM: 9.1 mg/dL (ref 8.6–10.2)
CHLORIDE: 96 mmol/L (ref 96–106)
CO2: 23 mmol/L (ref 18–29)
Creatinine, Ser: 0.93 mg/dL (ref 0.76–1.27)
GFR calc Af Amer: 91 mL/min/{1.73_m2} (ref >59)
GFR calc non Af Amer: 78 mL/min/{1.73_m2} (ref >59)
GLUCOSE: 115 mg/dL — AB (ref 65–99)
Potassium: 4.2 mmol/L (ref 3.5–5.2)
Sodium: 137 mmol/L (ref 134–144)

## 2016-05-30 ENCOUNTER — Encounter: Payer: Self-pay | Admitting: Cardiovascular Disease

## 2016-05-30 ENCOUNTER — Ambulatory Visit (INDEPENDENT_AMBULATORY_CARE_PROVIDER_SITE_OTHER): Payer: Medicare Other | Admitting: Cardiovascular Disease

## 2016-05-30 VITALS — BP 112/60 | HR 56 | Ht 67.0 in | Wt 188.0 lb

## 2016-05-30 DIAGNOSIS — I481 Persistent atrial fibrillation: Secondary | ICD-10-CM | POA: Diagnosis not present

## 2016-05-30 DIAGNOSIS — I10 Essential (primary) hypertension: Secondary | ICD-10-CM | POA: Diagnosis not present

## 2016-05-30 DIAGNOSIS — I4819 Other persistent atrial fibrillation: Secondary | ICD-10-CM

## 2016-05-30 DIAGNOSIS — E785 Hyperlipidemia, unspecified: Secondary | ICD-10-CM

## 2016-05-30 NOTE — Patient Instructions (Signed)
Medication Instructions: Continue same medications.   Labwork: None.   Procedures/Testing: None.   Follow-Up: 3 months with Dr. Tiwanda Threats.   Any Additional Special Instructions Will Be Listed Below (If Applicable).     If you need a refill on your cardiac medications before your next appointment, please call your pharmacy.   

## 2016-05-30 NOTE — Progress Notes (Signed)
Cardiology Office Note   Date:  05/30/2016   ID:  Zachary Green, DOB 1935-07-18, MRN [id number]  PCP:  [name], MD  Cardiologist:   [name], MD   Chief Complaint  Patient presents with  . other    9 week follow up. Patient states he is doing well. Meds reviewed verbally with patient.      History of Present Illness: Zachary Green is a 81 y.o. male who presents for a follow-up visit regarding persistent  atrial fibrillation maintained in sinus rhythm on amiodarone. He has history of BPH and prostate cancer treated with radiation therapy last summer with recurrent urinary tract infection. He underwent successful cardioversion in January after loading him with amiodarone. He has chronic medical conditions that include hypertension and hyperlipidemia. He is not diabetic. He smoked for 5 years in his 43s. Family history is remarkable for atrial fibrillation but no premature coronary artery disease. Nuclear stress test in December was normal. Echocardiogram in December showed normal ejection fraction, moderate mitral and tricuspid regurgitation and moderate pulmonary hypertension. He was placed on small dose furosemide. He has been doing reasonably well and denies any chest pain. He continues to have intermittent episodes of palpitations especially at night with random episodes of shortness of breath.    Past Medical History:  Diagnosis Date  . Arthritis   . BPH (benign prostatic hypertrophy)   . Cancer (HCC)    PROSTATE, BASAL CELL AND SQUAMOUS CELL  . Chronic diastolic CHF (congestive heart failure) ([place])    a. echo 12/17: 55-60%, no RWMA, trivial AI, moderate MR with systolic bowing without prolapse, moderate TR, mild biatrial enlargement, PASP 50 mmHg  . Diverticulitis   . ED (erectile dysfunction)   . GERD (gastroesophageal reflux disease)   . Hyperlipidemia   . Hypertension   . Impaired fasting glucose   . Persistent atrial fibrillation (HCC)    a.  on Eliquis, amiodarone, Toprol; b. s/p successful dccv 03/28/16; c. CHADS2VASc at least 3 (HTN, age x 2)  . Personal history of colonic adenoma 05/23/2007   04/2007 - 6 mm adenoma  . Schatzki's ring    grade 1 varices    Past Surgical History:  Procedure Laterality Date  . COLONOSCOPY    . ELECTROPHYSIOLOGIC STUDY N/A 03/28/2016   Procedure: CARDIOVERSION;  Surgeon: Minna Merritts, MD;  Location: [facility] ORS;  Service: Cardiovascular;  Laterality: N/A;  . ESOPHAGOGASTRODUODENOSCOPY    . ESOPHAGOGASTRODUODENOSCOPY    . GREEN LIGHT LASER TURP (TRANSURETHRAL RESECTION OF PROSTATE N/A 08/17/2015   Procedure: GREEN LIGHT LASER TURP (TRANSURETHRAL RESECTION OF PROSTATE;  Surgeon: Royston Cowper, MD;  Location: [facility] ORS;  Service: Urology;  Laterality: N/A;  . NM MYOVIEW LTD    . PATELLA FRACTURE SURGERY     pin placed 1984  . REFRACTIVE SURGERY       Current Outpatient Prescriptions  Medication Sig Dispense Refill  . alfuzosin (UROXATRAL) 10 MG 24 hr tablet Take 10 mg by mouth at bedtime.    Marland Kitchen amiodarone (PACERONE) 100 MG tablet Take 1 tablet (100 mg total) by mouth daily. 90 tablet 3  . apixaban (ELIQUIS) 5 MG TABS tablet Take 1 tablet (5 mg total) by mouth 2 (two) times daily. 60 tablet 3  . docusate sodium (COLACE) 100 MG capsule Take 2 capsules (200 mg total) by mouth 2 (two) times daily. 120 capsule 3  . esomeprazole (NEXIUM) 20 MG capsule Take 20 mg by mouth 2 (two) times daily  before a meal.    . furosemide (LASIX) 20 MG tablet Take 1 tablet (20 mg total) by mouth daily. 90 tablet 3  . hydrOXYzine (ATARAX/VISTARIL) 25 MG tablet Take 25 mg by mouth daily.    Marland Kitchen losartan (COZAAR) 50 MG tablet Take 50 mg by mouth every morning.    . Meth-Hyo-M Bl-Na Phos-Ph Sal (URIBEL) 118 MG CAPS Take 118 mg by mouth daily as needed.  2  . metoprolol succinate (TOPROL XL) 25 MG 24 hr tablet Take 1 tablet (25 mg total) by mouth daily. 30 tablet 3  . Multiple Vitamin (MULTI-VITAMINS) TABS Take 1 tablet by  mouth daily.     . Multiple Vitamins-Minerals (PRESERVISION AREDS 2 PO) Take 2 tablets by mouth daily.    . Omega-3 Fatty Acids (FISH OIL PO) Take 1 capsule by mouth 2 (two) times daily.    [name] Glycol-Propyl Glycol (SYSTANE OP) Place 1 drop into both eyes daily as needed (dry eyes).    . pravastatin (PRAVACHOL) 40 MG tablet TAKE ONE TABLET BY MOUTH EVERY DAY (Patient taking differently: TAKE ONE TABLET BY MOUTH EVERY EVENING) 90 tablet 3  . traMADol (ULTRAM) 50 MG tablet Take 1 tablet (50 mg total) by mouth every 6 (six) hours as needed. (Patient taking differently: Take 50 mg by mouth every 6 (six) hours as needed for moderate pain. ) 20 tablet 0   No current facility-administered medications for this visit.     Allergies:   Codeine    Social History:  The patient  reports that he has quit smoking. His smoking use included Cigarettes. He has a 7.00 pack-year smoking history. He has never used smokeless tobacco. He reports that he does not drink alcohol or use drugs.   Family History:  The patient's family history includes Alzheimer's disease in his mother; Diabetes in his brother.    ROS:  Please see the history of present illness.   Otherwise, review of systems are positive for none.   All other systems are reviewed and negative.    PHYSICAL EXAM: VS:  BP 112/60 (BP Location: Left Arm, Patient Position: Sitting, Cuff Size: Normal)   Pulse (!) 56   Ht 5\' 7"  (1.702 m)   Wt 188 lb (85.3 kg)   BMI 29.44 kg/m  , BMI Body mass index is 29.44 kg/m. GEN: Well nourished, well developed, in no acute distress  HEENT: normal  Neck: no JVD, carotid bruits, or masses Cardiac: RRR; no murmurs, rubs, or gallops,no edema  Respiratory:  clear to auscultation bilaterally, normal work of breathing GI: soft, nontender, nondistended, + BS MS: no deformity or atrophy  Skin: warm and dry, no rash Neuro:  Strength and sensation are intact Psych: euthymic mood, full affect   EKG:  EKG is  ordered today. The ekg ordered today demonstrates Sinus bradycardia with PACs. No significant ST or T wave changes.   Recent Labs: 03/07/2016: TSH 2.632 03/21/2016: ALT 29; B Natriuretic Peptide 193.0; Hemoglobin 15.4 03/23/2016: Platelets 193 05/05/2016: BUN 10; Creatinine, Ser 0.93; Potassium 4.2; Sodium 137    Lipid Panel    Component Value Date/Time   CHOL 181 11/22/2010 1012   TRIG 82.0 11/22/2010 1012   HDL 54.40 11/22/2010 1012   CHOLHDL 3 11/22/2010 1012   VLDL 16.4 11/22/2010 1012   LDLCALC 110 (H) 11/22/2010 1012   LDLDIRECT 126.2 05/01/2007 0954      Wt Readings from Last 3 Encounters:  05/30/16 188 lb (85.3 kg)  04/25/16 190 lb 8  oz (86.4 kg)  03/30/16 192 lb 4 oz (87.2 kg)       PAD Screen 03/09/2016  Previous PAD dx? No  Previous surgical procedure? No  Pain with walking? No  Feet/toe relief with dangling? No  Painful, non-healing ulcers? No  Extremities discolored? No      ASSESSMENT AND PLAN:  1.  Persistent atrial fibrillation: Maintaining sinus rhythm after successful cardioversion. He continues to be on small dose amiodarone 100 mg once daily and small dose Toprol. He is tolerating anticoagulation with Eliquis. He will need routine labs including thyroid and liver function tests during next follow-up.  2. Exertional dyspnea and fatigue:  Was likely due to atrial fibrillation. Symptoms improved. Ischemic workup was negative. I advised him to increase physical activities and reports his symptoms. Cardiac catheterization can be considered as a last resort if needed.  3. Essential hypertension: Blood pressure is controlled on current medications.  4. Aortic atherosclerosis: The patient has normal pulses and no evidence of obstructive peripheral arterial disease.  5. Hyperlipidemia: Currently on pravastatin.  6. Pulmonary nodules: Followed by primary care physician.    Disposition:   FU with me in 3 months  Signed,  [name], MD    05/30/2016 3:10 PM    [facility]

## 2016-06-29 ENCOUNTER — Ambulatory Visit
Admission: RE | Admit: 2016-06-29 | Discharge: 2016-06-29 | Disposition: A | Discharge status: Home / Self Care | Payer: Medicare Other | Source: Ambulatory Visit | Attending: Family Medicine | Admitting: Family Medicine

## 2016-06-29 DIAGNOSIS — I700 Atherosclerosis of aorta: Secondary | ICD-10-CM | POA: Diagnosis not present

## 2016-06-29 DIAGNOSIS — M47814 Spondylosis without myelopathy or radiculopathy, thoracic region: Secondary | ICD-10-CM | POA: Insufficient documentation

## 2016-06-29 DIAGNOSIS — I2510 Atherosclerotic heart disease of native coronary artery without angina pectoris: Secondary | ICD-10-CM | POA: Diagnosis not present

## 2016-06-29 DIAGNOSIS — Z8546 Personal history of malignant neoplasm of prostate: Secondary | ICD-10-CM | POA: Insufficient documentation

## 2016-06-29 DIAGNOSIS — Z87891 Personal history of nicotine dependence: Secondary | ICD-10-CM | POA: Insufficient documentation

## 2016-06-29 DIAGNOSIS — R911 Solitary pulmonary nodule: Secondary | ICD-10-CM | POA: Diagnosis present

## 2016-06-29 DIAGNOSIS — R918 Other nonspecific abnormal finding of lung field: Secondary | ICD-10-CM | POA: Diagnosis not present

## 2016-06-30 ENCOUNTER — Other Ambulatory Visit: Payer: Self-pay | Admitting: Family Medicine

## 2016-06-30 DIAGNOSIS — R911 Solitary pulmonary nodule: Secondary | ICD-10-CM

## 2016-06-30 DIAGNOSIS — Z87898 Personal history of other specified conditions: Secondary | ICD-10-CM

## 2016-07-21 ENCOUNTER — Ambulatory Visit
Admission: RE | Admit: 2016-07-21 | Discharge: 2016-07-21 | Disposition: A | Discharge status: Home / Self Care | Payer: Medicare Other | Source: Ambulatory Visit | Attending: Gastroenterology | Admitting: Gastroenterology

## 2016-07-21 DIAGNOSIS — Q458 Other specified congenital malformations of digestive system: Secondary | ICD-10-CM | POA: Insufficient documentation

## 2016-07-21 DIAGNOSIS — R1013 Epigastric pain: Secondary | ICD-10-CM | POA: Diagnosis not present

## 2016-07-27 ENCOUNTER — Other Ambulatory Visit: Payer: Self-pay | Admitting: Cardiovascular Disease

## 2016-08-07 ENCOUNTER — Telehealth: Payer: Self-pay | Admitting: Cardiovascular Disease

## 2016-08-07 NOTE — Telephone Encounter (Signed)
Request for cardiac clearance and to hold eliquis prior to 5/25 EGD with [facility] placed in MD basket

## 2016-08-08 NOTE — Telephone Encounter (Signed)
Faxed cardiac clearance to [facility], [phone]

## 2016-08-17 ENCOUNTER — Encounter: Payer: Self-pay | Admitting: *Deleted

## 2016-08-18 ENCOUNTER — Encounter
Admission: RE | Disposition: A | Discharge status: Home / Self Care | Payer: Self-pay | Source: Ambulatory Visit | Attending: Gastroenterology

## 2016-08-18 ENCOUNTER — Ambulatory Visit: Payer: Medicare Other | Admitting: Anesthesiology

## 2016-08-18 ENCOUNTER — Encounter: Payer: Self-pay | Admitting: *Deleted

## 2016-08-18 ENCOUNTER — Ambulatory Visit
Admission: RE | Admit: 2016-08-18 | Discharge: 2016-08-18 | Disposition: A | Discharge status: Home / Self Care | Payer: Medicare Other | Source: Ambulatory Visit | Attending: Gastroenterology | Admitting: Gastroenterology

## 2016-08-18 DIAGNOSIS — N400 Enlarged prostate without lower urinary tract symptoms: Secondary | ICD-10-CM | POA: Insufficient documentation

## 2016-08-18 DIAGNOSIS — I110 Hypertensive heart disease with heart failure: Secondary | ICD-10-CM | POA: Insufficient documentation

## 2016-08-18 DIAGNOSIS — K2950 Unspecified chronic gastritis without bleeding: Secondary | ICD-10-CM | POA: Insufficient documentation

## 2016-08-18 DIAGNOSIS — Z8546 Personal history of malignant neoplasm of prostate: Secondary | ICD-10-CM | POA: Diagnosis not present

## 2016-08-18 DIAGNOSIS — E785 Hyperlipidemia, unspecified: Secondary | ICD-10-CM | POA: Diagnosis not present

## 2016-08-18 DIAGNOSIS — Z87891 Personal history of nicotine dependence: Secondary | ICD-10-CM | POA: Diagnosis not present

## 2016-08-18 DIAGNOSIS — Z79899 Other long term (current) drug therapy: Secondary | ICD-10-CM | POA: Diagnosis not present

## 2016-08-18 DIAGNOSIS — R933 Abnormal findings on diagnostic imaging of other parts of digestive tract: Secondary | ICD-10-CM | POA: Diagnosis present

## 2016-08-18 DIAGNOSIS — I5032 Chronic diastolic (congestive) heart failure: Secondary | ICD-10-CM | POA: Insufficient documentation

## 2016-08-18 DIAGNOSIS — Z7901 Long term (current) use of anticoagulants: Secondary | ICD-10-CM | POA: Diagnosis not present

## 2016-08-18 DIAGNOSIS — I481 Persistent atrial fibrillation: Secondary | ICD-10-CM | POA: Insufficient documentation

## 2016-08-18 DIAGNOSIS — K219 Gastro-esophageal reflux disease without esophagitis: Secondary | ICD-10-CM | POA: Insufficient documentation

## 2016-08-18 DIAGNOSIS — K449 Diaphragmatic hernia without obstruction or gangrene: Secondary | ICD-10-CM | POA: Insufficient documentation

## 2016-08-18 DIAGNOSIS — R1013 Epigastric pain: Secondary | ICD-10-CM | POA: Diagnosis not present

## 2016-08-18 DIAGNOSIS — K317 Polyp of stomach and duodenum: Secondary | ICD-10-CM | POA: Insufficient documentation

## 2016-08-18 DIAGNOSIS — K3189 Other diseases of stomach and duodenum: Secondary | ICD-10-CM | POA: Diagnosis not present

## 2016-08-18 HISTORY — PX: ESOPHAGOGASTRODUODENOSCOPY (EGD) WITH PROPOFOL: SHX5813

## 2016-08-18 SURGERY — ESOPHAGOGASTRODUODENOSCOPY (EGD) WITH PROPOFOL
Anesthesia: General

## 2016-08-18 MED ORDER — PROPOFOL 500 MG/50ML IV EMUL
INTRAVENOUS | Status: AC
  Filled 2016-08-18: qty 50

## 2016-08-18 MED ORDER — PROPOFOL 500 MG/50ML IV EMUL
INTRAVENOUS | Status: DC | PRN
  Administered 2016-08-18: 150 ug/kg/min via INTRAVENOUS

## 2016-08-18 MED ORDER — PROPOFOL 10 MG/ML IV BOLUS
INTRAVENOUS | Status: DC | PRN
  Administered 2016-08-18: 40 mg via INTRAVENOUS

## 2016-08-18 MED ORDER — SODIUM CHLORIDE 0.9 % IV SOLN: INTRAVENOUS | Status: DC

## 2016-08-18 MED ORDER — LIDOCAINE HCL (CARDIAC) 20 MG/ML IV SOLN
INTRAVENOUS | Status: DC | PRN
  Administered 2016-08-18: 100 mg via INTRAVENOUS

## 2016-08-18 MED ORDER — GLYCOPYRROLATE 0.2 MG/ML IJ SOLN
INTRAMUSCULAR | Status: DC | PRN
  Administered 2016-08-18: .2 mg via INTRAVENOUS

## 2016-08-18 MED ORDER — FENTANYL CITRATE (PF) 100 MCG/2ML IJ SOLN
INTRAMUSCULAR | Status: AC
  Filled 2016-08-18: qty 2

## 2016-08-18 MED ORDER — EPHEDRINE SULFATE 50 MG/ML IJ SOLN
INTRAMUSCULAR | Status: AC
  Filled 2016-08-18: qty 1

## 2016-08-18 MED ORDER — FENTANYL CITRATE (PF) 100 MCG/2ML IJ SOLN
INTRAMUSCULAR | Status: DC | PRN
  Administered 2016-08-18: 50 ug via INTRAVENOUS

## 2016-08-18 MED ORDER — SODIUM CHLORIDE 0.9 % IV SOLN
INTRAVENOUS | Status: DC
  Administered 2016-08-18: 10:00:00 via INTRAVENOUS

## 2016-08-18 MED ORDER — EPHEDRINE SULFATE 50 MG/ML IJ SOLN
INTRAMUSCULAR | Status: DC | PRN
  Administered 2016-08-18: 10 mg via INTRAVENOUS

## 2016-08-18 NOTE — Anesthesia Preprocedure Evaluation (Addendum)
Anesthesia Evaluation  Patient identified by MRN, date of birth, ID band Patient awake    Reviewed: Allergy & Precautions, NPO status , Patient's Chart, lab work & pertinent test results, reviewed documented beta blocker date and time   Airway Mallampati: II  TM Distance: >3 FB     Dental  (+) Chipped, Upper Dentures, Partial Lower, Missing   Pulmonary former smoker,           Cardiovascular hypertension, Pt. on medications and Pt. on home beta blockers + dysrhythmias Atrial Fibrillation      Neuro/Psych    GI/Hepatic GERD  ,  Endo/Other    Renal/GU      Musculoskeletal  (+) Arthritis ,   Abdominal   Peds  Hematology   Anesthesia Other Findings Echo ok.  Reproductive/Obstetrics                            Anesthesia Physical Anesthesia Plan  ASA: III  Anesthesia Plan: General   Post-op Pain Management:    Induction: Intravenous  Airway Management Planned:   Additional Equipment:   Intra-op Plan:   Post-operative Plan:   Informed Consent: I have reviewed the patients History and Physical, chart, labs and discussed the procedure including the risks, benefits and alternatives for the proposed anesthesia with the patient or authorized representative who has indicated his/her understanding and acceptance.     Plan Discussed with: CRNA  Anesthesia Plan Comments:         Anesthesia Quick Evaluation

## 2016-08-18 NOTE — Anesthesia Postprocedure Evaluation (Signed)
Anesthesia Post Note  Patient: ARNIE CLINGENPEEL  Procedure(s) Performed: Procedure(s) (LRB): ESOPHAGOGASTRODUODENOSCOPY (EGD) WITH PROPOFOL (N/A)  Patient location during evaluation: Endoscopy Anesthesia Type: General Level of consciousness: awake and alert Pain management: pain level controlled Vital Signs Assessment: post-procedure vital signs reviewed and stable Respiratory status: spontaneous breathing, nonlabored ventilation, respiratory function stable and patient connected to nasal cannula oxygen Cardiovascular status: blood pressure returned to baseline and stable Postop Assessment: no signs of nausea or vomiting Anesthetic complications: no     Last Vitals:  Vitals:   08/18/16 1117 08/18/16 1137  BP: 140/78 138/78  Pulse: (!) 49 (!) 52  Resp: 15 18  Temp:      Last Pain:  Vitals:   08/18/16 1047  TempSrc: Tympanic                 Shirley Bolle S

## 2016-08-18 NOTE — Anesthesia Procedure Notes (Signed)
Date/Time: 08/18/2016 10:25 AM Performed by: Allean Found Pre-anesthesia Checklist: Patient identified, Emergency Drugs available, Suction available, Patient being monitored and Timeout performed Oxygen Delivery Method: Nasal cannula Placement Confirmation: positive ETCO2

## 2016-08-18 NOTE — H&P (Signed)
Outpatient short stay form Pre-procedure 08/18/2016 10:14 AM Lollie Sails MD  Primary Physician: Dr [name]  Reason for visit:  EGD  History of present illness:  Patient is a 81 year old male presenting today as above. He has a history of dyspepsia. He did undergo a upper GI series On 07/21/2016 which showed a deformity in gastric antrum. Palpitation was that they could be related to gastritis but recommendation of endoscopic evaluation rule out gastric antrum malignancy. Patient does take eliquis, the last time on Tuesday he has not taken it Wednesday Thursday or today. He takes no other blood thinning agents or aspirin products.   Current Facility-Administered Medications:  .  0.9 %  sodium chloride infusion, , Intravenous, Continuous, Lollie Sails, MD, Last Rate: 20 mL/hr at 08/18/16 1001 .  0.9 %  sodium chloride infusion, , Intravenous, Continuous, Lollie Sails, MD  Prescriptions Prior to Admission  Medication Sig Dispense Refill Last Dose  . alfuzosin (UROXATRAL) 10 MG 24 hr tablet Take 10 mg by mouth at bedtime.   08/17/2016 at Unknown time  . amiodarone (PACERONE) 100 MG tablet Take 1 tablet (100 mg total) by mouth daily. 90 tablet 3 08/17/2016 at Unknown time  . docusate sodium (COLACE) 100 MG capsule Take 2 capsules (200 mg total) by mouth 2 (two) times daily. 120 capsule 3 08/17/2016 at Unknown time  . ELIQUIS 5 MG TABS tablet TAKE 1 TABLET BY MOUTH TWICE A DAY 60 tablet 0 Past Week at Unknown time  . esomeprazole (NEXIUM) 20 MG capsule Take 20 mg by mouth 2 (two) times daily before a meal.   08/17/2016 at Unknown time  . furosemide (LASIX) 20 MG tablet Take 1 tablet (20 mg total) by mouth daily. 90 tablet 3 08/17/2016 at Unknown time  . hydrOXYzine (ATARAX/VISTARIL) 25 MG tablet Take 25 mg by mouth daily.   08/17/2016 at Unknown time  . losartan (COZAAR) 50 MG tablet Take 50 mg by mouth every morning.   08/17/2016 at Unknown time  . Meth-Hyo-M Bl-Na Phos-Ph  Sal (URIBEL) 118 MG CAPS Take 118 mg by mouth daily as needed.  2 Past Month at Unknown time  . metoprolol succinate (TOPROL-XL) 25 MG 24 hr tablet TAKE 1 TABLET BY MOUTH EVERY DAY 30 tablet 0 08/17/2016 at Unknown time  . Multiple Vitamin (MULTI-VITAMINS) TABS Take 1 tablet by mouth daily.    Past Week at Unknown time  . Multiple Vitamins-Minerals (PRESERVISION AREDS 2 PO) Take 2 tablets by mouth daily.   Past Week at Unknown time  . Omega-3 Fatty Acids (FISH OIL PO) Take 1 capsule by mouth 2 (two) times daily.   Past Week at Unknown time  . Polyethyl Glycol-Propyl Glycol (SYSTANE OP) Place 1 drop into both eyes daily as needed (dry eyes).   08/18/2016 at Unknown time  . pravastatin (PRAVACHOL) 40 MG tablet TAKE ONE TABLET BY MOUTH EVERY DAY (Patient taking differently: TAKE ONE TABLET BY MOUTH EVERY EVENING) 90 tablet 3 08/17/2016 at Unknown time  . traMADol (ULTRAM) 50 MG tablet Take 1 tablet (50 mg total) by mouth every 6 (six) hours as needed. (Patient taking differently: Take 50 mg by mouth every 6 (six) hours as needed for moderate pain. ) 20 tablet 0 08/17/2016 at Unknown time     Allergies  Allergen Reactions  . Codeine Nausea Only     Past Medical History:  Diagnosis Date  . Arthritis   . BPH (benign prostatic hypertrophy)   . Cancer ([place])  PROSTATE, BASAL CELL AND SQUAMOUS CELL  . Chronic diastolic CHF (congestive heart failure) ([place])    a. echo 12/17: 55-60%, no RWMA, trivial AI, moderate MR with systolic bowing without prolapse, moderate TR, mild biatrial enlargement, PASP 50 mmHg  . Diverticulitis   . ED (erectile dysfunction)   . GERD (gastroesophageal reflux disease)   . Hyperlipidemia   . Hypertension   . Impaired fasting glucose   . Persistent atrial fibrillation (HCC)    a. on Eliquis, amiodarone, Toprol; b. s/p successful dccv 03/28/16; c. CHADS2VASc at least 3 (HTN, age x 2)  . Personal history of colonic adenoma 05/23/2007   04/2007 - 6 mm adenoma  . Schatzki's ring     grade 1 varices    Review of systems:      Physical Exam    Heart and lungs: Regular rate and rhythm without rub or gallop, lungs are bilaterally clear.    HEENT: Normocephalic atraumatic eyes are anicteric    Other:     Pertinant exam for procedure: Soft nontender nondistended bowel sounds positive normoactive.    Planned proceedures: EGD and indicated procedures. I have discussed the risks benefits and complications of procedures to include not limited to bleeding, infection, perforation and the risk of sedation and the patient wishes to proceed.    Lollie Sails, MD Gastroenterology 08/18/2016  10:14 AM

## 2016-08-18 NOTE — Anesthesia Post-op Follow-up Note (Cosign Needed)
Anesthesia QCDR form completed.        

## 2016-08-18 NOTE — Transfer of Care (Signed)
Immediate Anesthesia Transfer of Care Note  Patient: Zachary Green  Procedure(s) Performed: Procedure(s): ESOPHAGOGASTRODUODENOSCOPY (EGD) WITH PROPOFOL (N/A)  Patient Location: PACU  Anesthesia Type:General  Level of Consciousness: sedated  Airway & Oxygen Therapy: Patient Spontanous Breathing and Patient connected to nasal cannula oxygen  Post-op Assessment: Report given to RN and Post -op Vital signs reviewed and stable  Post vital signs: Reviewed and stable  Last Vitals:  Vitals:   08/18/16 0943 08/18/16 1047  BP: (!) 174/70 117/69  Pulse: (!) 49 (!) 55  Resp: 16 15  Temp: (!) 35.8 C (!) 35.9 C    Last Pain:  Vitals:   08/18/16 1047  TempSrc: Tympanic         Complications: No apparent anesthesia complications

## 2016-08-18 NOTE — Op Note (Signed)
[facility] Gastroenterology Patient Name: Zachary Green Procedure Date: 08/18/2016 10:19 AM MRN: [id number] Account #: [id number] Date of Birth: [birth year] Admit Type: Outpatient Age: 81 Room: [facility] ENDO ROOM 1 Gender: Male Note Status: Finalized Procedure:            Upper GI endoscopy Indications:          Abnormal UGI series Providers:            Lollie Sails, MD Referring MD:         [name] (Referring MD) Medicines:            Monitored Anesthesia Care Complications:        No immediate complications. Procedure:            Pre-Anesthesia Assessment:                       - ASA Grade Assessment: III - A patient with severe                        systemic disease.                       After obtaining informed consent, the endoscope was                        passed under direct vision. Throughout the procedure,                        the patient's blood pressure, pulse, and oxygen                        saturations were monitored continuously. The Endoscope                        was introduced through the mouth, and advanced to the                        third part of duodenum. The upper GI endoscopy was                        accomplished without difficulty. The patient tolerated                        the procedure well. Findings:      The Z-line was variable. Biopsies were taken with a cold forceps for       histology.      A small hiatal hernia was found. The Z-line was a variable distance from       incisors; the hiatal hernia was sliding.      Diffuse mild inflammation characterized by congestion (edema) and       erythema, with some small erosions was found in the gastric body and in       the gastric antrum.      Biopsies were taken with a cold forceps in the gastric body and in the       gastric antrum for histology.      Two 5 to 20 mm submucosal papules (nodules) with no bleeding and no       stigmata of recent bleeding were  found at the incisura and on the       anterior wall  of the gastric antrum. Biopsies were taken with a cold       forceps for histology.      The cardia and gastric fundus were normal on retroflexion otherwise.      The examined duodenum was normal. Impression:           - Z-line variable. Biopsied.                       - Small hiatal hernia.                       - Bile gastritis.                       - Two submucosal papules (nodules) found in the                        stomach. Biopsied.                       - Normal examined duodenum.                       - Biopsies were taken with a cold forceps for histology                        in the gastric body and in the gastric antrum. Recommendation:       - Discharge patient to home.                       - Use Nexium (esomeprazole) 40 mg PO daily.                       - Use sucralfate tablets 1 gram PO BID.                       - Return to GI clinic in 6 weeks.                       - if gastric antral biopsies are uninformative, will do                        EUS Procedure Code(s):    --- Professional ---                       [phone], Esophagogastroduodenoscopy, flexible, transoral;                        with biopsy, single or multiple Diagnosis Code(s):    --- Professional ---                       K22.8, Other specified diseases of esophagus                       K44.9, Diaphragmatic hernia without obstruction or                        gangrene                       K29.60, Other gastritis without bleeding  K31.89, Other diseases of stomach and duodenum                       R93.3, Abnormal findings on diagnostic imaging of other                        parts of digestive tract CPT copyright 2016 American Medical Association. All rights reserved. The codes documented in this report are preliminary and upon coder review may  be revised to meet current compliance requirements. Lollie Sails,  MD 08/18/2016 10:48:44 AM This report has been signed electronically. Number of Addenda: 0 Note Initiated On: 08/18/2016 10:19 AM      [facility]

## 2016-08-22 ENCOUNTER — Encounter: Payer: Self-pay | Admitting: Gastroenterology

## 2016-08-22 LAB — SURGICAL PATHOLOGY

## 2016-08-24 ENCOUNTER — Telehealth: Payer: Self-pay

## 2016-08-24 NOTE — Telephone Encounter (Signed)
Oncology Nurse Navigator Documentation Referral received from [place] for EUS to evaluate gastric nodule. EUS scheduled for 09/21/16 with Dr. Cephas Darby at [facility]. Went over instructions and copy mailed to home address along with my contact information for any future questions. Clearace sent to Dr. Fletcher Anon to hold Eliquis.  INSTRUCTIONS FOR ENDOSCOPIC ULTRASOUND -Your procedure has been scheduled for June 28th with Dr. Cephas Darby at [facility]. -The hospital may contact you to pre-register over the phone.  -To get your scheduled arrival time, please call the Endoscopy unit at  [phone] between 1-3 p.m. on:  June 27th    -ON THE DAY OF YOU PROCEDURE:   1. If you are scheduled for a morning procedure, nothing to drink after midnight  -If you are scheduled for an afternoon procedure, you may have clear liquids until 5 hours prior  to the procedure but no carbonated drinks or broth  2. NO FOOD THE DAY OF YOUR PROCEDURE  3. You may take your heart, seizure, blood pressure, Parkinson's or breathing medications at  6am with just enough water to get your pills down  4. Do not take any oral Diabetic medications the morning of your procedure.  5. If you are a diabetic and are using insulin, please notify your prescribing physician of this  procedure as your dose may need to be altered related to not being able to eat or drink.   5. Do not take vitamins, iron, or fish oil for 5 days before your procedure     -On the day of your procedure, come to the [facility] Admitting/Registration desk (First desk on the right) at the scheduled arrival time. You MUST have someone drive you home from your procedure. You must have a responsible adult with a valid driver's license who is on site throughout your entire procedure and who can stay with you for several hours after your procedure. You may not go home alone in a taxi, shuttle [place] or bus, as the drivers will not be  responsible for you.  --If you have any questions please call me at the above contact     CLEARANCE/RISK ASSESMENT REQUEST                                                                      Phone-[phone]                                                                 [phone]                                                                                        Risk Assessment Form  Patient Name: Zachary Green                             DOB: Feb 16, 1936   Is scheduled for an elective procedure.   Procedure/s: Endoscopic Ultrasound  Date: 09/21/2016    Physician: Dr. Cephas Darby   Type of anesthesia: Monitored   Office contact person: Mariea Clonts RN    Fax number: [phone]     We are requesting risk assessment from: Dr. Fletcher Anon   Name of medication:                       _____discontinue 7 days prior to procedure.                     _____discontinue 5 days prior to procedure.                     _______discontinue for 2 days prior to procedure.                     _____discontinue 7 days prior to procedure & bridge with low weight Heparin                i.e.      Date: __________________________      [id number] signature:_____________________________________________      Navigator Location: [facility] (08/24/16 1500)   )Navigator Encounter Type: Telephone (08/24/16 1500) Telephone: Lahoma Crocker Call (08/24/16 1500)                       Barriers/Navigation Needs: Coordination of Care (08/24/16 1500)   Interventions: Coordination of Care (08/24/16 1500)   Coordination of Care: EUS (08/24/16 1500)                  Time Spent with Patient: 45 (08/24/16 1500)

## 2016-08-25 ENCOUNTER — Telehealth: Payer: Self-pay | Admitting: Cardiovascular Disease

## 2016-08-25 NOTE — Telephone Encounter (Signed)
Faxed clearance to Mariea Clonts, RN, [phone]

## 2016-08-25 NOTE — Telephone Encounter (Signed)
Request to hold eliquis prior to 6/28 endoscopic ultrasound received from [facility]. Placed in MD basket

## 2016-08-29 ENCOUNTER — Other Ambulatory Visit: Payer: Self-pay | Admitting: Cardiovascular Disease

## 2016-08-31 ENCOUNTER — Telehealth: Payer: Self-pay

## 2016-08-31 NOTE — Telephone Encounter (Signed)
  Oncology Nurse Navigator Documentation Clearance received from Dr. Fletcher Anon to discontinue Eliquis 2 days before EUS. Instructed Zachary Green to take Eliquis on 6/25 then stop for scheduled 6/28 EUS. Copy of these instructions also mailed to home address. Navigator Location: [facility] (08/31/16 1400)   )Navigator Encounter Type: Telephone (08/31/16 1400) Telephone: [name] (08/31/16 1400)                                                  Time Spent with Patient: 15 (08/31/16 1400)

## 2016-09-07 ENCOUNTER — Ambulatory Visit (INDEPENDENT_AMBULATORY_CARE_PROVIDER_SITE_OTHER): Payer: Medicare Other | Admitting: Cardiovascular Disease

## 2016-09-07 ENCOUNTER — Encounter: Payer: Self-pay | Admitting: Cardiovascular Disease

## 2016-09-07 VITALS — BP 140/70 | HR 49 | Ht 69.0 in | Wt 187.5 lb

## 2016-09-07 DIAGNOSIS — I5032 Chronic diastolic (congestive) heart failure: Secondary | ICD-10-CM

## 2016-09-07 DIAGNOSIS — I10 Essential (primary) hypertension: Secondary | ICD-10-CM

## 2016-09-07 DIAGNOSIS — I481 Persistent atrial fibrillation: Secondary | ICD-10-CM

## 2016-09-07 DIAGNOSIS — E785 Hyperlipidemia, unspecified: Secondary | ICD-10-CM

## 2016-09-07 DIAGNOSIS — I4819 Other persistent atrial fibrillation: Secondary | ICD-10-CM

## 2016-09-07 MED ORDER — METOPROLOL SUCCINATE ER 25 MG PO TB24
12.5000 mg | ORAL_TABLET | Freq: Every day | ORAL | 5 refills | Status: DC
Start: 2016-09-07 — End: 2016-11-20

## 2016-09-07 NOTE — Progress Notes (Signed)
Cardiology Office Note   Date:  09/07/2016   ID:  Zachary Green, DOB [birth year], MRN [id number]  PCP:  [name], MD  Cardiologist:   [name], MD   Chief Complaint  Patient presents with  . other    3 month follow up. Meds reviewed by the pt. verbally. Pt. c/o fluttering in chest.       History of Present Illness: Zachary Green is a 81 y.o. male who presents for a follow-up visit regarding persistent  atrial fibrillation maintained in sinus rhythm on amiodarone. He has history of BPH and prostate cancer treated with radiation therapy last summer with recurrent urinary tract infection. He underwent successful cardioversion in January after loading him with amiodarone. He has chronic medical conditions that include hypertension and hyperlipidemia. He is not diabetic. He smoked for 5 years in his 49s. Family history is remarkable for atrial fibrillation but no premature coronary artery disease. Nuclear stress test in December was normal. Echocardiogram in December showed normal ejection fraction, moderate mitral and tricuspid regurgitation and moderate pulmonary hypertension. He was placed on small dose furosemide.  He has been doing reasonably well overall with improvement in shortness of breath and fatigue. He complains of mild dizziness. No chest discomfort. No syncope.   Past Medical History:  Diagnosis Date  . Arthritis   . BPH (benign prostatic hypertrophy)   . Cancer (HCC)    PROSTATE, BASAL CELL AND SQUAMOUS CELL  . Chronic diastolic CHF (congestive heart failure) ([place])    a. echo 12/17: 55-60%, no RWMA, trivial AI, moderate MR with systolic bowing without prolapse, moderate TR, mild biatrial enlargement, PASP 50 mmHg  . Diverticulitis   . ED (erectile dysfunction)   . GERD (gastroesophageal reflux disease)   . Hyperlipidemia   . Hypertension   . Impaired fasting glucose   . Persistent atrial fibrillation (HCC)    a. on Eliquis, amiodarone,  Toprol; b. s/p successful dccv 03/28/16; c. CHADS2VASc at least 3 (HTN, age x 2)  . Personal history of colonic adenoma 05/23/2007   04/2007 - 6 mm adenoma  . Schatzki's ring    grade 1 varices    Past Surgical History:  Procedure Laterality Date  . COLONOSCOPY    . ELECTROPHYSIOLOGIC STUDY N/A 03/28/2016   Procedure: CARDIOVERSION;  Surgeon: Minna Merritts, MD;  Location: [facility] ORS;  Service: Cardiovascular;  Laterality: N/A;  . ESOPHAGOGASTRODUODENOSCOPY    . ESOPHAGOGASTRODUODENOSCOPY    . ESOPHAGOGASTRODUODENOSCOPY (EGD) WITH PROPOFOL N/A 08/18/2016   Procedure: ESOPHAGOGASTRODUODENOSCOPY (EGD) WITH PROPOFOL;  Surgeon: Lollie Sails, MD;  Location: [facility] ENDOSCOPY;  Service: Endoscopy;  Laterality: N/A;  . GREEN LIGHT LASER TURP (TRANSURETHRAL RESECTION OF PROSTATE N/A 08/17/2015   Procedure: GREEN LIGHT LASER TURP (TRANSURETHRAL RESECTION OF PROSTATE;  Surgeon: Royston Cowper, MD;  Location: [facility] ORS;  Service: Urology;  Laterality: N/A;  . NM MYOVIEW LTD    . PATELLA FRACTURE SURGERY     pin placed 1984  . REFRACTIVE SURGERY       Current Outpatient Prescriptions  Medication Sig Dispense Refill  . alfuzosin (UROXATRAL) 10 MG 24 hr tablet Take 10 mg by mouth at bedtime.    Marland Kitchen amiodarone (PACERONE) 100 MG tablet Take 1 tablet (100 mg total) by mouth daily. 90 tablet 3  . docusate sodium (COLACE) 100 MG capsule Take 2 capsules (200 mg total) by mouth 2 (two) times daily. 120 capsule 3  . ELIQUIS 5 MG TABS tablet TAKE 1 TABLET BY  MOUTH TWICE A DAY 60 tablet 1  . esomeprazole (NEXIUM) 20 MG capsule Take 20 mg by mouth 2 (two) times daily before a meal.    . furosemide (LASIX) 20 MG tablet Take 1 tablet (20 mg total) by mouth daily. 90 tablet 3  . hydrOXYzine (ATARAX/VISTARIL) 25 MG tablet Take 25 mg by mouth daily.    Marland Kitchen losartan (COZAAR) 50 MG tablet Take 50 mg by mouth every morning.    . Meth-Hyo-M Bl-Na Phos-Ph Sal (URIBEL) 118 MG CAPS Take 118 mg by mouth daily as needed.  2  .  metoprolol succinate (TOPROL-XL) 25 MG 24 hr tablet TAKE 1 TABLET BY MOUTH EVERY DAY 30 tablet 1  . Multiple Vitamin (MULTI-VITAMINS) TABS Take 1 tablet by mouth daily.     . Multiple Vitamins-Minerals (PRESERVISION AREDS 2 PO) Take 2 tablets by mouth daily.    . Omega-3 Fatty Acids (FISH OIL PO) Take 1 capsule by mouth 2 (two) times daily.    [name] Glycol-Propyl Glycol (SYSTANE OP) Place 1 drop into both eyes daily as needed (dry eyes).    . pravastatin (PRAVACHOL) 40 MG tablet TAKE ONE TABLET BY MOUTH EVERY DAY (Patient taking differently: TAKE ONE TABLET BY MOUTH EVERY EVENING) 90 tablet 3  . sucralfate (CARAFATE) 1 g tablet Take 1 g by mouth 2 (two) times daily.    . traMADol (ULTRAM) 50 MG tablet Take 1 tablet (50 mg total) by mouth every 6 (six) hours as needed. (Patient taking differently: Take 50 mg by mouth every 6 (six) hours as needed for moderate pain. ) 20 tablet 0   No current facility-administered medications for this visit.     Allergies:   Codeine    Social History:  The patient  reports that he has quit smoking. His smoking use included Cigarettes. He has a 7.00 pack-year smoking history. He has never used smokeless tobacco. He reports that he does not drink alcohol or use drugs.   Family History:  The patient's family history includes Alzheimer's disease in his mother; Diabetes in his brother.    ROS:  Please see the history of present illness.   Otherwise, review of systems are positive for none.   All other systems are reviewed and negative.    PHYSICAL EXAM: VS:  BP 140/70 (BP Location: Left Arm, Patient Position: Sitting, Cuff Size: Normal)   Pulse (!) 49   Ht 5\' 9"  (1.753 m)   Wt 187 lb 8 oz (85 kg)   BMI 27.69 kg/m  , BMI Body mass index is 27.69 kg/m. GEN: Well nourished, well developed, in no acute distress  HEENT: normal  Neck: no JVD, carotid bruits, or masses Cardiac: RRR; no murmurs, rubs, or gallops,no edema  Respiratory:  clear to auscultation  bilaterally, normal work of breathing GI: soft, nontender, nondistended, + BS MS: no deformity or atrophy  Skin: warm and dry, no rash Neuro:  Strength and sensation are intact Psych: euthymic mood, full affect   EKG:  EKG is ordered today. The ekg ordered today demonstrates Sinus bradycardia with no significant ST or T wave changes. Heart rate is 49 bpm.   Recent Labs: 03/07/2016: TSH 2.632 03/21/2016: ALT 29; B Natriuretic Peptide 193.0 03/23/2016: Hemoglobin 15.3; Platelets 193 05/05/2016: BUN 10; Creatinine, Ser 0.93; Potassium 4.2; Sodium 137    Lipid Panel    Component Value Date/Time   CHOL 181 11/22/2010 1012   TRIG 82.0 11/22/2010 1012   HDL 54.40 11/22/2010 1012   CHOLHDL  3 11/22/2010 1012   VLDL 16.4 11/22/2010 1012   LDLCALC 110 (H) 11/22/2010 1012   LDLDIRECT 126.2 05/01/2007 0954      Wt Readings from Last 3 Encounters:  09/07/16 187 lb 8 oz (85 kg)  08/18/16 183 lb (83 kg)  05/30/16 188 lb (85.3 kg)       PAD Screen 03/09/2016  Previous PAD dx? No  Previous surgical procedure? No  Pain with walking? No  Feet/toe relief with dangling? No  Painful, non-healing ulcers? No  Extremities discolored? No      ASSESSMENT AND PLAN:  1.  Persistent atrial fibrillation: He continues to maintain in sinus rhythm with small dose amiodarone 100 mg once daily. He is mildly bradycardic with some dizziness. Thus, I elected to decrease Toprol to 12.5 mg once daily. We might ultimately have to stop this medication altogether.  He is tolerating anticoagulation with Eliquis. Will check thyroid and liver panel in the next 6 months.  2. Essential hypertension: Blood pressure is controlled on current medications.  3. Aortic atherosclerosis: The patient has normal pulses and no evidence of obstructive peripheral arterial disease.  4. Hyperlipidemia: Currently on pravastatin. I reviewed his lipid profile in April which showed an LDL of 87.  5. Pulmonary nodules:  Followed by primary care physician.  6. Chronic diastolic heart failure with pulmonary hypertension: He appears to be euvolemic on current dose of furosemide. I reviewed his basic metabolic profile from April which showed normal renal function and electrolytes.    Disposition:   FU with me in 6 months  Signed,  [name], MD  09/07/2016 8:49 AM    [place]

## 2016-09-07 NOTE — Patient Instructions (Signed)
Medication Instructions:  Your physician has recommended you make the following change in your medication:  DECREASE metoprolol to 12.5mg  (1/2 tablet) once daily   Labwork: none  Testing/Procedures: .none  Follow-Up: Your physician wants you to follow-up in: 6 months with Dr. Fletcher Anon.  You will receive a reminder letter in the mail two months in advance. If you don't receive a letter, please call our office to schedule the follow-up appointment.   Any Other Special Instructions Will Be Listed Below (If Applicable).     If you need a refill on your cardiac medications before your next appointment, please call your pharmacy.

## 2016-09-20 ENCOUNTER — Encounter: Payer: Self-pay | Admitting: *Deleted

## 2016-09-21 ENCOUNTER — Ambulatory Visit: Payer: Medicare Other | Admitting: Anesthesiology

## 2016-09-21 ENCOUNTER — Ambulatory Visit
Admission: RE | Admit: 2016-09-21 | Discharge: 2016-09-21 | Disposition: A | Discharge status: Home / Self Care | Payer: Medicare Other | Source: Ambulatory Visit | Attending: Gastroenterology | Admitting: Gastroenterology

## 2016-09-21 ENCOUNTER — Encounter
Admission: RE | Disposition: A | Discharge status: Home / Self Care | Payer: Self-pay | Source: Ambulatory Visit | Attending: Gastroenterology

## 2016-09-21 ENCOUNTER — Encounter: Payer: Self-pay | Admitting: *Deleted

## 2016-09-21 DIAGNOSIS — I481 Persistent atrial fibrillation: Secondary | ICD-10-CM | POA: Insufficient documentation

## 2016-09-21 DIAGNOSIS — Z86010 Personal history of colonic polyps: Secondary | ICD-10-CM | POA: Diagnosis not present

## 2016-09-21 DIAGNOSIS — N529 Male erectile dysfunction, unspecified: Secondary | ICD-10-CM | POA: Insufficient documentation

## 2016-09-21 DIAGNOSIS — Z7901 Long term (current) use of anticoagulants: Secondary | ICD-10-CM | POA: Diagnosis not present

## 2016-09-21 DIAGNOSIS — E785 Hyperlipidemia, unspecified: Secondary | ICD-10-CM | POA: Diagnosis not present

## 2016-09-21 DIAGNOSIS — Z8719 Personal history of other diseases of the digestive system: Secondary | ICD-10-CM | POA: Insufficient documentation

## 2016-09-21 DIAGNOSIS — I110 Hypertensive heart disease with heart failure: Secondary | ICD-10-CM | POA: Insufficient documentation

## 2016-09-21 DIAGNOSIS — D131 Benign neoplasm of stomach: Secondary | ICD-10-CM | POA: Diagnosis not present

## 2016-09-21 DIAGNOSIS — M1990 Unspecified osteoarthritis, unspecified site: Secondary | ICD-10-CM | POA: Diagnosis not present

## 2016-09-21 DIAGNOSIS — N400 Enlarged prostate without lower urinary tract symptoms: Secondary | ICD-10-CM | POA: Diagnosis not present

## 2016-09-21 DIAGNOSIS — Z87891 Personal history of nicotine dependence: Secondary | ICD-10-CM | POA: Diagnosis not present

## 2016-09-21 DIAGNOSIS — K219 Gastro-esophageal reflux disease without esophagitis: Secondary | ICD-10-CM | POA: Diagnosis not present

## 2016-09-21 DIAGNOSIS — I5032 Chronic diastolic (congestive) heart failure: Secondary | ICD-10-CM | POA: Diagnosis not present

## 2016-09-21 DIAGNOSIS — Z885 Allergy status to narcotic agent status: Secondary | ICD-10-CM | POA: Insufficient documentation

## 2016-09-21 DIAGNOSIS — Z85828 Personal history of other malignant neoplasm of skin: Secondary | ICD-10-CM | POA: Insufficient documentation

## 2016-09-21 DIAGNOSIS — K317 Polyp of stomach and duodenum: Secondary | ICD-10-CM | POA: Diagnosis present

## 2016-09-21 DIAGNOSIS — K3189 Other diseases of stomach and duodenum: Secondary | ICD-10-CM | POA: Diagnosis not present

## 2016-09-21 HISTORY — PX: UPPER ESOPHAGEAL ENDOSCOPIC ULTRASOUND (EUS): SHX6562

## 2016-09-21 SURGERY — UPPER ESOPHAGEAL ENDOSCOPIC ULTRASOUND (EUS)
Anesthesia: General

## 2016-09-21 MED ORDER — PROPOFOL 500 MG/50ML IV EMUL
INTRAVENOUS | Status: DC | PRN
  Administered 2016-09-21: 140 ug/kg/min via INTRAVENOUS

## 2016-09-21 MED ORDER — EPHEDRINE SULFATE 50 MG/ML IJ SOLN
INTRAMUSCULAR | Status: DC | PRN
  Administered 2016-09-21 (×2): 5 mg via INTRAVENOUS

## 2016-09-21 MED ORDER — GLYCOPYRROLATE 0.2 MG/ML IJ SOLN
INTRAMUSCULAR | Status: DC | PRN
  Administered 2016-09-21: 0.2 mg via INTRAVENOUS

## 2016-09-21 MED ORDER — LIDOCAINE 2% (20 MG/ML) 5 ML SYRINGE
INTRAMUSCULAR | Status: DC | PRN
  Administered 2016-09-21: 40 mg via INTRAVENOUS

## 2016-09-21 MED ORDER — MIDAZOLAM HCL 2 MG/2ML IJ SOLN
INTRAMUSCULAR | Status: AC
  Filled 2016-09-21: qty 2

## 2016-09-21 MED ORDER — FENTANYL CITRATE (PF) 100 MCG/2ML IJ SOLN
INTRAMUSCULAR | Status: AC
  Filled 2016-09-21: qty 2

## 2016-09-21 MED ORDER — PROPOFOL 10 MG/ML IV BOLUS
INTRAVENOUS | Status: DC | PRN
  Administered 2016-09-21: 100 mg via INTRAVENOUS

## 2016-09-21 MED ORDER — FENTANYL CITRATE (PF) 100 MCG/2ML IJ SOLN
INTRAMUSCULAR | Status: DC | PRN
  Administered 2016-09-21: 50 ug via INTRAVENOUS

## 2016-09-21 MED ORDER — PROPOFOL 500 MG/50ML IV EMUL
INTRAVENOUS | Status: AC
  Filled 2016-09-21: qty 50

## 2016-09-21 MED ORDER — MIDAZOLAM HCL 5 MG/5ML IJ SOLN
INTRAMUSCULAR | Status: DC | PRN
  Administered 2016-09-21: 1 mg via INTRAVENOUS

## 2016-09-21 MED ORDER — SODIUM CHLORIDE 0.9 % IV SOLN
INTRAVENOUS | Status: DC
  Administered 2016-09-21: 13:00:00 via INTRAVENOUS

## 2016-09-21 NOTE — Anesthesia Postprocedure Evaluation (Signed)
Anesthesia Post Note  Patient: JAMIEN CASANOVA  Procedure(s) Performed: Procedure(s) (LRB): UPPER ESOPHAGEAL ENDOSCOPIC ULTRASOUND (EUS) (N/A)  Patient location during evaluation: PACU Anesthesia Type: General Level of consciousness: awake and alert Pain management: pain level controlled Vital Signs Assessment: post-procedure vital signs reviewed and stable Respiratory status: spontaneous breathing, nonlabored ventilation, respiratory function stable and patient connected to nasal cannula oxygen Cardiovascular status: blood pressure returned to baseline and stable Postop Assessment: no signs of nausea or vomiting Anesthetic complications: no     Last Vitals:  Vitals:   09/21/16 1344 09/21/16 1347  BP: 101/65 101/65  Pulse: 63 70  Resp: 11 12  Temp: (!) 35.7 C (!) 35.7 C    Last Pain:  Vitals:   09/21/16 1344  TempSrc: Tympanic                 Molli Barrows

## 2016-09-21 NOTE — Anesthesia Preprocedure Evaluation (Signed)
Anesthesia Evaluation  Patient identified by MRN, date of birth, ID band Patient awake    Reviewed: Allergy & Precautions, H&P , NPO status , Patient's Chart, lab work & pertinent test results, reviewed documented beta blocker date and time   Airway Mallampati: II   Neck ROM: full    Dental  (+) Teeth Intact   Pulmonary neg pulmonary ROS, former smoker,    Pulmonary exam normal        Cardiovascular Exercise Tolerance: Poor hypertension, On Medications +CHF  negative cardio ROS Normal cardiovascular exam Rhythm:regular Rate:Normal     Neuro/Psych negative neurological ROS  negative psych ROS   GI/Hepatic negative GI ROS, Neg liver ROS, GERD  Medicated,  Endo/Other  negative endocrine ROS  Renal/GU negative Renal ROS  negative genitourinary   Musculoskeletal   Abdominal   Peds  Hematology negative hematology ROS (+)   Anesthesia Other Findings Past Medical History: No date: Arthritis No date: BPH (benign prostatic hypertrophy) No date: Cancer (HCC)     Comment: PROSTATE, BASAL CELL AND SQUAMOUS CELL No date: Chronic diastolic CHF (congestive heart failur*     Comment: a. echo 12/17: 55-60%, no RWMA, trivial AI,               moderate MR with systolic bowing without               prolapse, moderate TR, mild biatrial               enlargement, PASP 50 mmHg No date: Diverticulitis No date: ED (erectile dysfunction) No date: GERD (gastroesophageal reflux disease) No date: Hyperlipidemia No date: Hypertension No date: Impaired fasting glucose No date: Persistent atrial fibrillation (HCC)     Comment: a. on Eliquis, amiodarone, Toprol; b. s/p               successful dccv 03/28/16; c. CHADS2VASc at least               3 (HTN, age x 2) 05/23/2007: Personal history of colonic adenoma     Comment: 04/2007 - 6 mm adenoma No date: Schatzki's ring     Comment: grade 1 varices Past Surgical History: No date:  COLONOSCOPY 03/28/2016: ELECTROPHYSIOLOGIC STUDY N/A     Comment: Procedure: CARDIOVERSION;  Surgeon: Minna Merritts, MD;  Location: [facility] ORS;  Service:               Cardiovascular;  Laterality: N/A; No date: ESOPHAGOGASTRODUODENOSCOPY No date: ESOPHAGOGASTRODUODENOSCOPY 08/18/2016: ESOPHAGOGASTRODUODENOSCOPY (EGD) WITH PROPOFOL N/A     Comment: Procedure: ESOPHAGOGASTRODUODENOSCOPY (EGD)               WITH PROPOFOL;  Surgeon: Lollie Sails,               MD;  Location: [facility] ENDOSCOPY;  Service:               Endoscopy;  Laterality: N/A; 08/17/2015: GREEN LIGHT LASER TURP (TRANSURETHRAL RESECTIO* N/A     Comment: Procedure: GREEN LIGHT LASER TURP               (TRANSURETHRAL RESECTION OF PROSTATE;  Surgeon:              Royston Cowper, MD;  Location: [facility] ORS;                Service: Urology;  Laterality: N/A; No date: NM MYOVIEW LTD No date: PATELLA  FRACTURE SURGERY     Comment: pin placed 1984 No date: REFRACTIVE SURGERY BMI    Body Mass Index:  27.17 kg/m     Reproductive/Obstetrics negative OB ROS                             Anesthesia Physical Anesthesia Plan  ASA: III  Anesthesia Plan: General   Post-op Pain Management:    Induction:   PONV Risk Score and Plan:   Airway Management Planned:   Additional Equipment:   Intra-op Plan:   Post-operative Plan:   Informed Consent: I have reviewed the patients History and Physical, chart, labs and discussed the procedure including the risks, benefits and alternatives for the proposed anesthesia with the patient or authorized representative who has indicated his/her understanding and acceptance.   Dental Advisory Given  Plan Discussed with: CRNA  Anesthesia Plan Comments:         Anesthesia Quick Evaluation

## 2016-09-21 NOTE — H&P (Signed)
PRE-PROCEDURE HISTORY AND PHYSICAL   Zachary Green presents for his scheduled Procedure(s): UPPER ESOPHAGEAL ENDOSCOPIC ULTRASOUND (EUS).  The indication for the procedure(s) is GASTRIC NODULE.  There have been no significant recent changes in the patient's medical status.  Past Medical History:  Diagnosis Date  . Arthritis   . BPH (benign prostatic hypertrophy)   . Cancer (HCC)    PROSTATE, BASAL CELL AND SQUAMOUS CELL  . Chronic diastolic CHF (congestive heart failure) ([place])    a. echo 12/17: 55-60%, no RWMA, trivial AI, moderate MR with systolic bowing without prolapse, moderate TR, mild biatrial enlargement, PASP 50 mmHg  . Diverticulitis   . ED (erectile dysfunction)   . GERD (gastroesophageal reflux disease)   . Hyperlipidemia   . Hypertension   . Impaired fasting glucose   . Persistent atrial fibrillation (HCC)    a. on Eliquis, amiodarone, Toprol; b. s/p successful dccv 03/28/16; c. CHADS2VASc at least 3 (HTN, age x 2)  . Personal history of colonic adenoma 05/23/2007   04/2007 - 6 mm adenoma  . Schatzki's ring    grade 1 varices    Past Surgical History:  Procedure Laterality Date  . COLONOSCOPY    . ELECTROPHYSIOLOGIC STUDY N/A 03/28/2016   Procedure: CARDIOVERSION;  Surgeon: Minna Merritts, MD;  Location: [facility] ORS;  Service: Cardiovascular;  Laterality: N/A;  . ESOPHAGOGASTRODUODENOSCOPY    . ESOPHAGOGASTRODUODENOSCOPY    . ESOPHAGOGASTRODUODENOSCOPY (EGD) WITH PROPOFOL N/A 08/18/2016   Procedure: ESOPHAGOGASTRODUODENOSCOPY (EGD) WITH PROPOFOL;  Surgeon: Lollie Sails, MD;  Location: [facility] ENDOSCOPY;  Service: Endoscopy;  Laterality: N/A;  . GREEN LIGHT LASER TURP (TRANSURETHRAL RESECTION OF PROSTATE N/A 08/17/2015   Procedure: GREEN LIGHT LASER TURP (TRANSURETHRAL RESECTION OF PROSTATE;  Surgeon: Royston Cowper, MD;  Location: [facility] ORS;  Service: Urology;  Laterality: N/A;  . NM MYOVIEW LTD    . PATELLA FRACTURE SURGERY     pin placed 1984  . REFRACTIVE SURGERY       Allergies Allergies  Allergen Reactions  . Codeine Nausea Only    Medications MULTI-VITAMINS, Multiple Vitamins-Minerals, Omega-3 Fatty Acids, Polyethyl Glycol-Propyl Glycol, URIBEL, alfuzosin, amiodarone, apixaban, docusate sodium, esomeprazole, furosemide, hydrOXYzine, losartan, metoprolol succinate, pravastatin, sucralfate, and traMADol  Physical Examination  Body mass index is 27.17 kg/m. BP 129/68   Pulse (!) 58   Temp (!) 96.9 F (36.1 C) (Tympanic)   Resp 16   Ht 5\' 9"  (1.753 m)   Wt 83.5 kg (184 lb)   SpO2 99%   BMI 27.17 kg/m  General:   Alert,  pleasant and cooperative in NAD Head:  Normocephalic and atraumatic. Lungs:  Clear throughout to auscultation.    Heart:  Regular  Abdomen:  Soft, nontender and nondistended. Normal bowel sounds, without guarding, and without rebound.   Neurologic:  Alert and  oriented x4;  grossly normal neurologically.  ASSESSMENT AND PLAN  Zachary Green has been evaluated and deemed appropriate to undergo the planned Procedure(s): UPPER ESOPHAGEAL ENDOSCOPIC ULTRASOUND (EUS).

## 2016-09-21 NOTE — Anesthesia Post-op Follow-up Note (Cosign Needed)
Anesthesia QCDR form completed.        

## 2016-09-21 NOTE — Op Note (Signed)
[facility] Patient Name: Zachary Green Procedure Date: 09/21/2016 1:07 PM MRN: [id number] Account #: [id number] Date of Birth: 1935-12-09 Admit Type: Outpatient Age: 81 Room: [facility] ENDO ROOM 3 Gender: Male Note Status: Finalized Procedure:            Upper EUS Indications:          Gastric mucosal mass/polyp found on endoscopy Patient Profile:      Refer to note in patient chart for documentation of                        history and physical. Providers:            Lenetta Quaker. Cephas Darby, MD Referring MD:         [name] (Referring MD), Theodore Demark                        (Referring MD), Lollie Sails, MD (Referring MD) Medicines:            Propofol per Anesthesia Complications:        No immediate complications. Procedure:            Pre-Anesthesia Assessment:                       - Prior to the procedure, a History and Physical was                        performed, and patient medications and allergies were                        reviewed. The patient is competent. The risks and                        benefits of the procedure and the sedation options and                        risks were discussed with the patient. All questions                        were answered and informed consent was obtained.                        Patient identification and proposed procedure were                        verified by the physician in the pre-procedure area.                        Mental Status Examination: alert and oriented. Airway                        Examination: normal oropharyngeal airway and neck                        mobility. Respiratory Examination: clear to                        auscultation. CV Examination: normal. ASA Grade                        Assessment:  II - A patient with mild systemic disease.                        After reviewing the risks and benefits, the patient was                        deemed in  satisfactory condition to undergo the                        procedure. The anesthesia plan was to use monitored                        anesthesia care (MAC). Immediately prior to                        administration of medications, the patient was                        re-assessed for adequacy to receive sedatives. The                        heart rate, respiratory rate, oxygen saturations, blood                        pressure, adequacy of pulmonary ventilation, and                        response to care were monitored throughout the                        procedure. The physical status of the patient was                        re-assessed after the procedure.                       After obtaining informed consent, the endoscope was                        passed under direct vision. Throughout the procedure,                        the patient's blood pressure, pulse, and oxygen                        saturations were monitored continuously. The EUS GI                        Radial Array [id number] was introduced through the mouth,                        and advanced to the stomach for ultrasound examination                        from the esophagus and stomach. Findings:      Endoscopic Finding :      The examined esophagus was endoscopically normal.      A single 15 mm sessile polyp with no bleeding and no stigmata of recent       bleeding was found in the gastric antrum. Biopsies were  taken with a       cold forceps for histology.      A single 4 mm sessile polyp with no bleeding and no stigmata of recent       bleeding was found at the pre pylorus.      The examined duodenum was endoscopically normal.      Endosonographic Finding :      A hypoechoic sessile polypoid lesion was identified endosonographically       in the antrum of the stomach involving the superfiical mucosa and deep       mucosa. The mass measured 8 mm by 13 mm in maximal cross-sectional       diameter. The  endosonographic borders were well-defined. There was       sonographic evidence suggesting invasion into the superficial mucosa and       the deep mucosa (Layer 2).      There was no sign of significant endosonographic abnormality in the left       lobe of the liver. No masses were identified.      One benign-appearing lymph node was visualized in the celiac region       (level 20). It measured 3 mm by 4 mm in maximal cross-sectional       diameter. The node was round and heterogenous. Impression:           EGD Impression:                       - Normal esophagus.                       - A single gastric polyp within the antrum. Biopsied.                       - A single gastric polyp with the pre pyloric region.                       - Normal examined duodenum.                       EUS Impression:                       - A polypoid lesion was found in the antrum of the                        stomach. A tissue diagnosis was obtained prior to this                        exam. This is highly suspicious for a fundic gland                        polyp.                       - There was no evidence of significant pathology in the                        left lobe of the liver.                       - One benign lymph node was visualized in the celiac  region (level 20). Tissue has not been obtained.                        However, the endosonographic appearance is consistent                        with benign inflammatory changes. Recommendation:       - Discharge patient to home (ambulatory).                       - Await path results.                       - Perform an upper GI endoscopy at [facility] at an                        appointment to be scheduled to remove both polyps via                        EMR.                       - The findings and recommendations were discussed with                        the patient and their family. Procedure Code(s):    ---  Professional ---                       [phone], 63, Esophagogastroduodenoscopy, flexible,                        transoral; with endoscopic ultrasound examination                        limited to the esophagus, stomach or duodenum, and                        adjacent structures                       43239, 59,52, Esophagogastroduodenoscopy, flexible,                        transoral; with biopsy, single or multiple Diagnosis Code(s):    --- Professional ---                       K31.7, Polyp of stomach and duodenum                       K31.89, Other diseases of stomach and duodenum                       I89.9, Noninfective disorder of lymphatic vessels and                        lymph nodes, unspecified CPT copyright 2016 American Medical Association. All rights reserved. The codes documented in this report are preliminary and upon coder review may  be revised to meet current compliance requirements. Attending Participation:      I personally performed the entire procedure. Dr. Lenetta Quaker. Cephas Darby, MD Lenetta Quaker. Micaila Ziemba, MD 09/21/2016 1:59:35 PM This report  has been signed electronically. Number of Addenda: 0 Note Initiated On: 09/21/2016 1:07 PM Estimated Blood Loss: Estimated blood loss: none.      [facility]

## 2016-09-21 NOTE — Transfer of Care (Signed)
Immediate Anesthesia Transfer of Care Note  Patient: Zachary Green  Procedure(s) Performed: Procedure(s): UPPER ESOPHAGEAL ENDOSCOPIC ULTRASOUND (EUS) (N/A)  Patient Location: PACU and Endoscopy Unit  Anesthesia Type:General  Level of Consciousness: awake, sedated and drowsy  Airway & Oxygen Therapy: Patient Spontanous Breathing and Patient connected to nasal cannula oxygen  Post-op Assessment: Report given to RN and Post -op Vital signs reviewed and stable  Post vital signs: Reviewed and stable  Last Vitals:  Vitals:   09/21/16 1233 09/21/16 1344  BP: 129/68 101/65  Pulse: (!) 58 63  Resp: 16 11  Temp: (!) 36.1 C (!) 35.7 C    Last Pain:  Vitals:   09/21/16 1344  TempSrc: Tympanic         Complications: No apparent anesthesia complications

## 2016-09-22 ENCOUNTER — Encounter: Payer: Self-pay | Admitting: Gastroenterology

## 2016-09-22 LAB — SURGICAL PATHOLOGY

## 2016-10-06 ENCOUNTER — Telehealth: Payer: Self-pay | Admitting: Cardiovascular Disease

## 2016-10-06 NOTE — Telephone Encounter (Signed)
Dr Lilli Light calling from [place] states he is retuning our call about patient anti coag  Please advise

## 2016-10-06 NOTE — Telephone Encounter (Signed)
K[place] I spoke with him. Ok to hold Eliquis 2 days before and 2 days after.

## 2016-10-06 NOTE — Telephone Encounter (Signed)
Dr. Robin Searing returning phone call to Dr. Fletcher Anon regarding anticoagulation plan. Routed to Dr. Fletcher Anon. Dr. Cephas Darby may be reached at [phone]

## 2016-10-09 NOTE — Telephone Encounter (Signed)
Reviewed Eliquis instructions with patient's wife.

## 2016-10-26 ENCOUNTER — Other Ambulatory Visit: Payer: Self-pay | Admitting: Cardiovascular Disease

## 2016-11-19 ENCOUNTER — Other Ambulatory Visit: Payer: Self-pay | Admitting: Cardiovascular Disease

## 2016-11-20 ENCOUNTER — Other Ambulatory Visit: Payer: Self-pay

## 2016-11-20 MED ORDER — METOPROLOL SUCCINATE ER 25 MG PO TB24: 12.5000 mg | ORAL_TABLET | Freq: Every day | ORAL | 5 refills | Status: DC

## 2016-11-20 NOTE — Telephone Encounter (Signed)
Please review for refill. Dr. Tyrell Antonio last office note states to decrease the metoprolol to 12.5 mg tablet once daily. Please verify and advise for refill.

## 2016-11-23 ENCOUNTER — Telehealth: Payer: Self-pay | Admitting: Cardiovascular Disease

## 2016-11-23 NOTE — Telephone Encounter (Signed)
Reviewed with Dr. Fletcher Anon who agrees that pt should follow Dr. Nelly Laurence advice to hold eliquis at least one week as noted in [place]. Informed pt's wife who is appreciative of the call.

## 2016-11-23 NOTE — Telephone Encounter (Signed)
Patient had a procedure to remove gastric polyp and was directed to stay off eliquis for 7 more days .   Please call to discuss.

## 2016-12-25 ENCOUNTER — Other Ambulatory Visit: Payer: Self-pay | Admitting: Cardiovascular Disease

## 2016-12-29 ENCOUNTER — Ambulatory Visit
Admission: RE | Admit: 2016-12-29 | Discharge: 2016-12-29 | Disposition: A | Discharge status: Home / Self Care | Payer: Medicare Other | Source: Ambulatory Visit | Attending: Family Medicine | Admitting: Family Medicine

## 2016-12-29 ENCOUNTER — Ambulatory Visit: Payer: Medicare Other

## 2016-12-29 DIAGNOSIS — Z87898 Personal history of other specified conditions: Secondary | ICD-10-CM | POA: Insufficient documentation

## 2016-12-29 DIAGNOSIS — R918 Other nonspecific abnormal finding of lung field: Secondary | ICD-10-CM | POA: Diagnosis not present

## 2016-12-29 DIAGNOSIS — R911 Solitary pulmonary nodule: Secondary | ICD-10-CM

## 2017-02-01 ENCOUNTER — Telehealth: Payer: Self-pay | Admitting: Cardiovascular Disease

## 2017-02-01 NOTE — Telephone Encounter (Signed)
Patient having in office prostate biopsy and he needs card clearance to hold eliquis for 2 days prior to procedure not scheduled yet   Please fax clearance to : [phone]

## 2017-02-05 NOTE — Telephone Encounter (Signed)
Eliquis 5mg  for persistent afib. Last office visit June 2018; follow up in 6 months. Routed to MD

## 2017-02-05 NOTE — Telephone Encounter (Signed)
Routed to Dr. Franchot Gallo, [phone]

## 2017-02-05 NOTE — Telephone Encounter (Signed)
Okay to hold Eliquis 2 days before the procedure and resume 1 day after if no bleeding issues.

## 2017-03-01 ENCOUNTER — Other Ambulatory Visit: Payer: Self-pay | Admitting: Urology

## 2017-03-01 DIAGNOSIS — C61 Malignant neoplasm of prostate: Secondary | ICD-10-CM

## 2017-03-11 ENCOUNTER — Other Ambulatory Visit: Payer: Self-pay | Admitting: Cardiovascular Disease

## 2017-03-12 NOTE — Telephone Encounter (Signed)
Refill Request.  

## 2017-03-13 ENCOUNTER — Other Ambulatory Visit: Payer: Self-pay | Admitting: Physician Assistant

## 2017-03-21 ENCOUNTER — Encounter (HOSPITAL_COMMUNITY)
Admission: RE | Admit: 2017-03-21 | Discharge: 2017-03-21 | Disposition: A | Discharge status: Home / Self Care | Payer: Medicare Other | Source: Ambulatory Visit | Attending: Urology | Admitting: Urology

## 2017-03-21 DIAGNOSIS — C61 Malignant neoplasm of prostate: Secondary | ICD-10-CM | POA: Diagnosis not present

## 2017-03-21 MED ORDER — TECHNETIUM TC 99M MEDRONATE IV KIT
21.6000 | PACK | Freq: Once | INTRAVENOUS | Status: AC | PRN
  Administered 2017-03-21: 21.6 via INTRAVENOUS

## 2017-03-30 ENCOUNTER — Telehealth: Payer: Self-pay | Admitting: Medical Oncology

## 2017-03-30 ENCOUNTER — Encounter: Payer: Self-pay | Admitting: Medical Oncology

## 2017-03-30 NOTE — Progress Notes (Signed)
Emailed release of information to Mr. Picklesimer. He signed and returned. I faxed to [facility] to request prostate pathology slides from 07/26/15.

## 2017-03-30 NOTE — Telephone Encounter (Signed)
I called pt to introduce myself as the Prostate Nurse Navigator and the Coordinator of the [facility] [place].  1. I confirmed with the patient he is aware of his referral to the clinic 04/06/17 arriving at 8:00am.  2. I discussed the format of the clinic and the physicians he will be seeing that day.  3. I discussed where the clinic is located and how to contact me.  4. I confirmed his address and informed him I would be mailing a packet of information and forms to be completed. I asked him to bring them with him the day of his appointment.   He voiced understanding of the above. I asked him to call me if he has any questions or concerns regarding his appointments or the forms he needs to complete.

## 2017-04-02 ENCOUNTER — Encounter: Payer: Self-pay | Admitting: Medical Oncology

## 2017-04-05 ENCOUNTER — Ambulatory Visit (INDEPENDENT_AMBULATORY_CARE_PROVIDER_SITE_OTHER): Payer: Medicare Other | Admitting: Cardiovascular Disease

## 2017-04-05 ENCOUNTER — Encounter: Payer: Self-pay | Admitting: Radiation Oncology

## 2017-04-05 ENCOUNTER — Encounter: Payer: Self-pay | Admitting: Cardiovascular Disease

## 2017-04-05 ENCOUNTER — Telehealth: Payer: Self-pay | Admitting: Medical Oncology

## 2017-04-05 VITALS — BP 124/72 | HR 54 | Ht 67.0 in | Wt 194.5 lb

## 2017-04-05 DIAGNOSIS — I5032 Chronic diastolic (congestive) heart failure: Secondary | ICD-10-CM

## 2017-04-05 DIAGNOSIS — I481 Persistent atrial fibrillation: Secondary | ICD-10-CM

## 2017-04-05 DIAGNOSIS — I10 Essential (primary) hypertension: Secondary | ICD-10-CM | POA: Diagnosis not present

## 2017-04-05 DIAGNOSIS — C61 Malignant neoplasm of prostate: Secondary | ICD-10-CM | POA: Insufficient documentation

## 2017-04-05 DIAGNOSIS — I4819 Other persistent atrial fibrillation: Secondary | ICD-10-CM

## 2017-04-05 DIAGNOSIS — E785 Hyperlipidemia, unspecified: Secondary | ICD-10-CM | POA: Diagnosis not present

## 2017-04-05 NOTE — Patient Instructions (Signed)
Medication Instructions: Continue same medications.   Labwork: None.   Procedures/Testing: None.   Follow-Up: 9 months with Dr. Arida.   Any Additional Special Instructions Will Be Listed Below (If Applicable).     If you need a refill on your cardiac medications before your next appointment, please call your pharmacy.   

## 2017-04-05 NOTE — Progress Notes (Signed)
Cardiology Office Note   Date:  04/05/2017   ID:  Zachary Green, DOB [birth year], MRN [id number]  PCP:  [name], MD  Cardiologist:   [name], MD   Chief Complaint  Patient presents with  . other    6 month follow up. Meds reveiwed by the pt. verbally. Pt. c/o waking up with numbness and tingling in hands.         History of Present Illness: Zachary Green is a 82 y.o. male who presents for a follow-up visit regarding persistent  atrial fibrillation maintained in sinus rhythm on amiodarone. He has history of BPH and prostate cancer treated with radiation therapy last summer with recurrent urinary tract infection. He underwent successful cardioversion in January after loading him with amiodarone. He has chronic medical conditions that include hypertension and hyperlipidemia. He is not diabetic. He smoked for 5 years in his 80s. Family history is remarkable for atrial fibrillation but no premature coronary artery disease. Nuclear stress test in 2017 was normal. Echocardiogram in 2017 showed normal ejection fraction, moderate mitral and tricuspid regurgitation and moderate pulmonary hypertension. He was placed on small dose furosemide.  During last visit, I decrease Toprol to 12.5 mg once daily due to bradycardia.  He has been doing extremely well and denies any chest pain, shortness of breath or palpitations.  No dizziness, syncope or presyncope.  Past Medical History:  Diagnosis Date  . Arthritis   . BPH (benign prostatic hypertrophy)   . Cancer (HCC)    PROSTATE, BASAL CELL AND SQUAMOUS CELL  . Chronic diastolic CHF (congestive heart failure) ([place])    a. echo 12/17: 55-60%, no RWMA, trivial AI, moderate MR with systolic bowing without prolapse, moderate TR, mild biatrial enlargement, PASP 50 mmHg  . Diverticulitis   . ED (erectile dysfunction)   . GERD (gastroesophageal reflux disease)   . Hyperlipidemia   . Hypertension   . Impaired fasting glucose   .  Persistent atrial fibrillation (HCC)    a. on Eliquis, amiodarone, Toprol; b. s/p successful dccv 03/28/16; c. CHADS2VASc at least 3 (HTN, age x 2)  . Personal history of colonic adenoma 05/23/2007   04/2007 - 6 mm adenoma  . Rosacea   . Schatzki's ring    grade 1 varices    Past Surgical History:  Procedure Laterality Date  . COLONOSCOPY    . ELECTROPHYSIOLOGIC STUDY N/A 03/28/2016   Procedure: CARDIOVERSION;  Surgeon: Minna Merritts, MD;  Location: [facility] ORS;  Service: Cardiovascular;  Laterality: N/A;  . ESOPHAGOGASTRODUODENOSCOPY    . ESOPHAGOGASTRODUODENOSCOPY    . ESOPHAGOGASTRODUODENOSCOPY (EGD) WITH PROPOFOL N/A 08/18/2016   Procedure: ESOPHAGOGASTRODUODENOSCOPY (EGD) WITH PROPOFOL;  Surgeon: Lollie Sails, MD;  Location: [facility] ENDOSCOPY;  Service: Endoscopy;  Laterality: N/A;  . GREEN LIGHT LASER TURP (TRANSURETHRAL RESECTION OF PROSTATE N/A 08/17/2015   Procedure: GREEN LIGHT LASER TURP (TRANSURETHRAL RESECTION OF PROSTATE;  Surgeon: Royston Cowper, MD;  Location: [facility] ORS;  Service: Urology;  Laterality: N/A;  . NM MYOVIEW LTD    . PATELLA FRACTURE SURGERY     pin placed 1984  . REFRACTIVE SURGERY    . UPPER ESOPHAGEAL ENDOSCOPIC ULTRASOUND (EUS) N/A 09/21/2016   Procedure: UPPER ESOPHAGEAL ENDOSCOPIC ULTRASOUND (EUS);  Surgeon: Reita Cliche, MD;  Location: [facility] ENDOSCOPY;  Service: Gastroenterology;  Laterality: N/A;     Current Outpatient Medications  Medication Sig Dispense Refill  . amiodarone (PACERONE) 100 MG tablet Take 1 tablet (100 mg total) by mouth daily.  90 tablet 3  . docusate sodium (COLACE) 100 MG capsule Take 2 capsules (200 mg total) by mouth 2 (two) times daily. (Patient taking differently: Take 100 mg by mouth daily. ) 120 capsule 3  . ELIQUIS 5 MG TABS tablet TAKE 1 TABLET BY MOUTH TWICE A DAY 60 tablet 1  . esomeprazole (NEXIUM) 20 MG capsule Take 20 mg by mouth daily at 12 noon.     . furosemide (LASIX) 20 MG tablet TAKE 1 TABLET (20 MG TOTAL) BY  MOUTH DAILY. 90 tablet 0  . losartan (COZAAR) 50 MG tablet Take 50 mg by mouth every morning.    . metoprolol succinate (TOPROL-XL) 25 MG 24 hr tablet Take 0.5 tablets (12.5 mg total) by mouth daily. 30 tablet 5  . metroNIDAZOLE (METROGEL) 1 % gel APP 1 GRAM TOPICALLY ON THE SKIN HS    . Multiple Vitamin (MULTI-VITAMINS) TABS Take 1 tablet by mouth daily.     . Multiple Vitamins-Minerals (PRESERVISION AREDS 2 PO) Take 2 tablets by mouth daily.    . Omega-3 Fatty Acids (FISH OIL PO) Take 1 capsule by mouth 2 (two) times daily.    [name] Glycol-Propyl Glycol (SYSTANE OP) Place 1 drop into both eyes daily as needed (dry eyes).    . pravastatin (PRAVACHOL) 40 MG tablet TAKE ONE TABLET BY MOUTH EVERY DAY (Patient taking differently: TAKE ONE TABLET BY MOUTH EVERY EVENING) 90 tablet 3  . permethrin (ELIMITE) 5 % cream Apply 1 application topically 2 (two) times daily.      No current facility-administered medications for this visit.     Allergies:   Codeine    Social History:  The patient  reports that he has quit smoking. His smoking use included cigarettes. He has a 7.00 pack-year smoking history. he has never used smokeless tobacco. He reports that he does not drink alcohol or use drugs.   Family History:  The patient's family history includes Alzheimer's disease in his mother; Diabetes in his brother.    ROS:  Please see the history of present illness.   Otherwise, review of systems are positive for none.   All other systems are reviewed and negative.    PHYSICAL EXAM: VS:  BP 124/72 (BP Location: Left Arm, Patient Position: Sitting, Cuff Size: Normal)   Pulse (!) 54   Ht 5\' 7"  (1.702 m)   Wt 194 lb 8 oz (88.2 kg)   BMI 30.46 kg/m  , BMI Body mass index is 30.46 kg/m. GEN: Well nourished, well developed, in no acute distress  HEENT: normal  Neck: no JVD, carotid bruits, or masses Cardiac: RRR; no murmurs, rubs, or gallops,no edema  Respiratory:  clear to auscultation  bilaterally, normal work of breathing GI: soft, nontender, nondistended, + BS MS: no deformity or atrophy  Skin: warm and dry, no rash Neuro:  Strength and sensation are intact Psych: euthymic mood, full affect   EKG:  EKG is ordered today. The ekg ordered today demonstrates sinus bradycardia with a heart rate of 54 bpm   Recent Labs: 05/05/2016: BUN 10; Creatinine, Ser 0.93; Potassium 4.2; Sodium 137    Lipid Panel    Component Value Date/Time   CHOL 181 11/22/2010 1012   TRIG 82.0 11/22/2010 1012   HDL 54.40 11/22/2010 1012   CHOLHDL 3 11/22/2010 1012   VLDL 16.4 11/22/2010 1012   LDLCALC 110 (H) 11/22/2010 1012   LDLDIRECT 126.2 05/01/2007 0954      Wt Readings from Last 3 Encounters:  04/05/17 194 lb 8 oz (88.2 kg)  09/21/16 184 lb (83.5 kg)  09/07/16 187 lb 8 oz (85 kg)       PAD Screen 03/09/2016  Previous PAD dx? No  Previous surgical procedure? No  Pain with walking? No  Feet/toe relief with dangling? No  Painful, non-healing ulcers? No  Extremities discolored? No      ASSESSMENT AND PLAN:  1.  Persistent atrial fibrillation: He continues to maintain in sinus rhythm with small dose amiodarone 100 mg once daily.  Bradycardia and dizziness improved after decreasing the dose of Toprol to 12.5 mg once daily.  He is tolerating anticoagulation with Eliquis with no bleeding complications.  I reviewed his labs done in December which were unremarkable including liver function test.   I recommend checking his TSH with next labs given that he is on amiodarone.  I will forward this to his primary care physician.  2. Essential hypertension: Blood pressure is controlled on current medications.  3. Aortic atherosclerosis: The patient has normal pulses and no evidence of obstructive peripheral arterial disease.  4. Hyperlipidemia: Currently on pravastatin.  Most recent lipid profile in December showed triglyceride of 111, HDL of 62 and an LDL of 103.  5. Pulmonary  nodules: Followed by primary care physician.  6. Chronic diastolic heart failure with pulmonary hypertension: He appears to be euvolemic small dose furosemide.   Disposition:   FU with me in 9 months  Signed,  [name], MD  04/05/2017 11:14 AM    [place]

## 2017-04-05 NOTE — Progress Notes (Signed)
GU Location of Tumor / Histology: prostatic adenocarcinoma  If Prostate Cancer, Gleason Score is (3 + 4) and PSA is (5.7) pretreatment. Prostate volume 45 grams.  Underwent initial ultrasound and biopsy of his prostate on 07/26/15. Following DR. Wolfe performed greenlight laser prostatectomy. Then, the patient underwent HIFU at a center in [place] on 10/08/15. His nadir PSA seems to have been 1.1 and in July 2018 1.7.  Biopsies of prostate (if applicable) revealed:     Past/Anticipated interventions by medical oncology, if any: no  Weight changes, if any: no  Bowel/Bladder complaints, if any: Reports ED, abnormal sensation with urination, and that he must strain to void. Also, he reports frequency, urgency, weak stream, intermittency, nocturia and difficulty emptying the bladder.  Nausea/Vomiting, if any: no  Pain issues, if any:  no  SAFETY ISSUES:  Prior radiation? no  Pacemaker/ICD? no  Possible current pregnancy? no  Is the patient on methotrexate? no  Current Complaints / other details:  82 year old male. Married with one daughter and three sons.

## 2017-04-05 NOTE — Progress Notes (Signed)
Requested prostate biopsy slides [phone] from [facility] for prostate clinic.

## 2017-04-05 NOTE — Telephone Encounter (Signed)
Sandra-wife called to confirm patients appointment for [facility] 04/06/17 @8  am. He had some questions about medical forms that I had mailed regarding clinic. We discussed and all questions were answered. We reviewed location of [place] and registration.

## 2017-04-06 ENCOUNTER — Encounter: Payer: Self-pay | Admitting: General Practice

## 2017-04-06 ENCOUNTER — Encounter: Payer: Self-pay | Admitting: Medical Oncology

## 2017-04-06 ENCOUNTER — Ambulatory Visit
Admission: RE | Admit: 2017-04-06 | Discharge: 2017-04-06 | Disposition: A | Discharge status: Home / Self Care | Payer: Medicare Other | Source: Ambulatory Visit | Attending: Radiation Oncology | Admitting: Radiation Oncology

## 2017-04-06 ENCOUNTER — Inpatient Hospital Stay: Payer: Medicare Other | Attending: Oncology | Admitting: Oncology

## 2017-04-06 ENCOUNTER — Encounter: Payer: Self-pay | Admitting: Radiation Oncology

## 2017-04-06 ENCOUNTER — Other Ambulatory Visit: Payer: Self-pay

## 2017-04-06 VITALS — BP 162/72 | HR 48 | Temp 98.0°F | Resp 16 | Ht 67.0 in | Wt 200.6 lb

## 2017-04-06 DIAGNOSIS — I110 Hypertensive heart disease with heart failure: Secondary | ICD-10-CM | POA: Insufficient documentation

## 2017-04-06 DIAGNOSIS — Z79899 Other long term (current) drug therapy: Secondary | ICD-10-CM | POA: Insufficient documentation

## 2017-04-06 DIAGNOSIS — C61 Malignant neoplasm of prostate: Secondary | ICD-10-CM | POA: Insufficient documentation

## 2017-04-06 DIAGNOSIS — E785 Hyperlipidemia, unspecified: Secondary | ICD-10-CM | POA: Insufficient documentation

## 2017-04-06 DIAGNOSIS — I481 Persistent atrial fibrillation: Secondary | ICD-10-CM | POA: Insufficient documentation

## 2017-04-06 DIAGNOSIS — K219 Gastro-esophageal reflux disease without esophagitis: Secondary | ICD-10-CM | POA: Insufficient documentation

## 2017-04-06 DIAGNOSIS — Z7901 Long term (current) use of anticoagulants: Secondary | ICD-10-CM

## 2017-04-06 DIAGNOSIS — N400 Enlarged prostate without lower urinary tract symptoms: Secondary | ICD-10-CM | POA: Insufficient documentation

## 2017-04-06 DIAGNOSIS — I10 Essential (primary) hypertension: Secondary | ICD-10-CM | POA: Diagnosis not present

## 2017-04-06 DIAGNOSIS — I5032 Chronic diastolic (congestive) heart failure: Secondary | ICD-10-CM | POA: Insufficient documentation

## 2017-04-06 DIAGNOSIS — Z87891 Personal history of nicotine dependence: Secondary | ICD-10-CM | POA: Insufficient documentation

## 2017-04-06 HISTORY — DX: Malignant neoplasm of prostate: C61

## 2017-04-06 NOTE — Progress Notes (Signed)
[place] Psychosocial Distress Screening Spiritual Care  Met with Gary (who goes by "Concha Se"), wife Katharine Look, and son Louie Casa in [facility] to introduce [facility] team/resources, reviewing distress screen per protocol.  The patient scored a 4 on the Psychosocial Distress Thermometer which indicates moderate distress. Also assessed for distress and other psychosocial needs.   ONCBCN DISTRESS SCREENING 04/06/2017  Screening Type Initial Screening  Distress experienced in past week (1-10) 4  Emotional problem type Nervousness/Anxiety  Information Concerns Type Lack of info about treatment   Family verbalized appreciation for thorough [facility] approach and [place] team/programming. Per pt, learning more about dx/tx and having a plan has relieved his anxiety (though he is eager to take action and wishes tx could start sooner). Their questions have been answered, and they are considering Prostate Cancer Support Group and other programs to help with community-building support and meaning-making.  Follow up needed: No. Per family, no other needs at this time. They are aware of ongoing chaplain availability and know to contact Team as needs or questions arise.  [place], [place], [facility] Pager [phone] Voicemail [phone]

## 2017-04-06 NOTE — Progress Notes (Signed)
                               Care Plan Summary  Name: Dr. Jackqulyn Livings DOB: 1935/11/26   Your Medical Team:   Urologist -  Dr. Raynelle Bring, [facility]  Radiation Oncologist - Dr. Tyler Pita, [facility]   Medical Oncologist - Dr. Zola Button, [place]  Recommendations: 1) IMRT-radiation  2) 6 months of androgen deprivation(hromone injection)  * These recommendations are based on information available as of today's consult.      Recommendations may change depending on the results of further tests or exams  Next Steps: 1) Dr. Alan Ripper office will schedule androgen deprivation 2) Dr. Johny Shears office will schedule the radiation planning and treatments mid March.   When appointments need to be scheduled, you will be contacted by [facility] and/or [facility].  Patient received business cards for all team members. He was provided with a copy of "Fall Safety Prevention Sheet"  Questions?  Please do not hesitate to call Cira Rue, RN, BSN, OCN at [phone] any questions or concerns.  Shirlean Mylar is your Oncology Nurse Navigator and is available to assist you while you're receiving your medical care at [facility].

## 2017-04-06 NOTE — Progress Notes (Signed)
Reason for Referral: Prostate cancer  HPI: 82 year old gentleman currently of [place] where he lived the 81 of his life.  He has history of atrial fibrillation and chronically anticoagulated on Eliquis.  He was diagnosed with prostate cancer in 2017.  At that time his PSA was 5.7 and a biopsy showed a Gleason score 4+3 = 7.  He underwent HIFU on October 08, 2015 under the care of Dr. Rogers Blocker.  His PSA declined to 1.1 and subsequently started to rise again in 2018.  He is started developing more urinary symptoms including frequency, weak stream as well as urgency.  He establish care with Dr. Diona Fanti and a repeat PSA in July 2018 was 1.7.  His PSA was up to 2.33 in November 2018.  He underwent a cystoscopy and dilation of stricture under the care of Dr. 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  His voiding symptoms have improved significantly since that procedure.  He underwent a prostate biopsy on February 22, 2017 which showed a high volume disease with a Gleason score 4+3 = 7 on the right side with 6 out of 12 cores involved.  There were 3 cores on the left side were also involved with Gleason score 4+3 = 7 prostate cancer.  For this reason he was referred to the prostate cancer multidisciplinary clinic.  Medically, he continues to be reasonably active and attends to activities of daily living.  He still works outside the house and able to drive comfortably.  His urinary symptoms have much improved and currently uses the bathroom one time during the night.  He does have urgency but is manageable.  He does not report any headaches, blurry vision, syncope or seizures.  He does not report any fevers, chills or sweats or weight loss.  He does not report any chest pain, palpitation orthopnea.  He does not report any cough, wheezing or hemoptysis.  He does not report any nausea, vomiting or abdominal pain.  He does not report any skeletal complaints of arthralgias or  myalgias.  Remaining review of systems unremarkable.   Past Medical History:  Diagnosis Date  . Arthritis   . BPH (benign prostatic hypertrophy)   . Chronic diastolic CHF (congestive heart failure) ([place])    a. echo 12/17: 55-60%, no RWMA, trivial AI, moderate MR with systolic bowing without prolapse, moderate TR, mild biatrial enlargement, PASP 50 mmHg  . Diverticulitis   . ED (erectile dysfunction)   . GERD (gastroesophageal reflux disease)   . Hyperlipidemia   . Hypertension   . Impaired fasting glucose   . Persistent atrial fibrillation (HCC)    a. on Eliquis, amiodarone, Toprol; b. s/p successful dccv 03/28/16; c. CHADS2VASc at least 3 (HTN, age x 2)  . Personal history of colonic adenoma 05/23/2007   04/2007 - 6 mm adenoma  . Prostate cancer ([place])   . Rosacea   . Schatzki's ring    grade 1 varices  :  Past Surgical History:  Procedure Laterality Date  . COLONOSCOPY    . ELECTROPHYSIOLOGIC STUDY N/A 03/28/2016   Procedure: CARDIOVERSION;  Surgeon: Minna Merritts, MD;  Location: [facility] ORS;  Service: Cardiovascular;  Laterality: N/A;  . ESOPHAGOGASTRODUODENOSCOPY    . ESOPHAGOGASTRODUODENOSCOPY    . ESOPHAGOGASTRODUODENOSCOPY (EGD) WITH PROPOFOL N/A 08/18/2016   Procedure: ESOPHAGOGASTRODUODENOSCOPY (EGD) WITH PROPOFOL;  Surgeon: Lollie Sails, MD;  Location[facility]COPY;  Service: Endoscopy;  Laterality: N/A;  . GREEN LIGHT LASER TURP (TRANSURETHRAL RESECTION OF PROSTATE N/A 08/17/2015  Procedure: GREEN LIGHT LASER TURP (TRANSURETHRAL RESECTION OF PROSTATE;  Surgeon: Royston Cowper, MD;  Location: [facility] ORS;  Service: Urology;  Laterality: N/A;  . NM MYOVIEW LTD    . PATELLA FRACTURE SURGERY     pin placed 1984  . REFRACTIVE SURGERY    . UPPER ESOPHAGEAL ENDOSCOPIC ULTRASOUND (EUS) N/A 09/21/2016   Procedure: UPPER ESOPHAGEAL ENDOSCOPIC ULTRASOUND (EUS);  Surgeon: Reita Cliche, MD;  Location: [facility] ENDOSCOPY;  Service: Gastroenterology;  Laterality: N/A;   :   Current Outpatient Medications:  .  amiodarone (PACERONE) 100 MG tablet, Take 1 tablet (100 mg total) by mouth daily., Disp: 90 tablet, Rfl: 3 .  docusate sodium (COLACE) 100 MG capsule, Take 2 capsules (200 mg total) by mouth 2 (two) times daily. (Patient taking differently: Take 100 mg by mouth daily. ), Disp: 120 capsule, Rfl: 3 .  ELIQUIS 5 MG TABS tablet, TAKE 1 TABLET BY MOUTH TWICE A DAY, Disp: 60 tablet, Rfl: 1 .  esomeprazole (NEXIUM) 20 MG capsule, Take 20 mg by mouth daily at 12 noon. , Disp: , Rfl:  .  furosemide (LASIX) 20 MG tablet, TAKE 1 TABLET (20 MG TOTAL) BY MOUTH DAILY., Disp: 90 tablet, Rfl: 0 .  losartan (COZAAR) 50 MG tablet, Take 50 mg by mouth every morning., Disp: , Rfl:  .  metoprolol succinate (TOPROL-XL) 25 MG 24 hr tablet, Take 0.5 tablets (12.5 mg total) by mouth daily., Disp: 30 tablet, Rfl: 5 .  metroNIDAZOLE (METROGEL) 1 % gel, APP 1 GRAM TOPICALLY ON THE SKIN HS, Disp: , Rfl:  .  Multiple Vitamin (MULTI-VITAMINS) TABS, Take 1 tablet by mouth daily. , Disp: , Rfl:  .  Omega-3 Fatty Acids (FISH OIL PO), Take 1 capsule by mouth 2 (two) times daily., Disp: , Rfl:  .  permethrin (ELIMITE) 5 % cream, Apply 1 application topically 2 (two) times daily. , Disp: , Rfl:  .  Polyethyl Glycol-Propyl Glycol (SYSTANE OP), Place 1 drop into both eyes daily as needed (dry eyes)., Disp: , Rfl:  .  pravastatin (PRAVACHOL) 40 MG tablet, TAKE ONE TABLET BY MOUTH EVERY DAY (Patient taking differently: TAKE ONE TABLET BY MOUTH EVERY EVENING), Disp: 90 tablet, Rfl: 3:  Allergies  Allergen Reactions  . Codeine Nausea Only  :  Family History  Problem Relation Age of Onset  . Alzheimer's disease Mother   . Cancer Mother        Melanoma  . Diabetes Brother   :  Social History   Socioeconomic History  . Marital status: Married    Spouse name: Katharine Look  . Number of children: 4  . Years of education: Not on file  . Highest education level: Not on file  Social Needs  .  Financial resource strain: Not on file  . Food insecurity - worry: Not on file  . Food insecurity - inability: Not on file  . Transportation needs - medical: Not on file  . Transportation needs - non-medical: Not on file  Occupational History  . Occupation: retired Teacher, music: retired  Tobacco Use  . Smoking status: Former Smoker    Packs/day: 1.00    Years: 7.00    Pack years: 7.00    Types: Cigarettes    Last attempt to quit: 03/28/1959    Years since quitting: 58.0  . Smokeless tobacco: Never Used  Substance and Sexual Activity  . Alcohol use: No  . Drug use: No  . Sexual activity: Not on  file  Other Topics Concern  . Not on file  Social History Narrative   Has living will   Wife is [place] health care POA (then oldest son, Sherren Mocha)   Not sure about DNR---will accept resuscitation for now   Probably would not want a feeding tube  :  Pertinent items are noted in HPI.  Exam: ECOG 0 General appearance: alert and cooperative appeared without distress. Eyes: conjunctivae/corneas clear. PERRL Throat: lips, mucosa, and tongue normal.  No thrush or ulcers. Resp: clear to auscultation bilaterally without rhonchi or wheezes. Chest wall: no tenderness Cardio: regular rate and rhythm, S1, S2 normal, no murmur, click, rub or gallop GI: soft, non-tender; bowel sounds normal; no masses,  no organomegaly Skin: Skin color, texture, turgor normal. No rashes or lesions Lymph nodes: Cervical, supraclavicular, and axillary nodes normal. Neurologic: Grossly normal  CBC    Component Value Date/Time   WBC 4.8 03/23/2016 1549   WBC 5.4 03/21/2016 1637   RBC 4.84 03/23/2016 1549   RBC 4.76 03/21/2016 1637   HGB 15.3 03/23/2016 1549   HCT 44.4 03/23/2016 1549   PLT 193 03/23/2016 1549   MCV 92 03/23/2016 1549   MCH 31.6 03/23/2016 1549   MCH 32.3 03/21/2016 1637   MCHC 34.5 03/23/2016 1549   MCHC 34.0 03/21/2016 1637   RDW 14.2 03/23/2016 1549   LYMPHSABS 1.3 03/23/2016 1549    MONOABS 0.4 03/21/2016 1637   EOSABS 0.0 03/23/2016 1549   BASOSABS 0.0 03/23/2016 1549     Chemistry      Component Value Date/Time   NA 137 05/05/2016 0821   K 4.2 05/05/2016 0821   CL 96 05/05/2016 0821   CO2 23 05/05/2016 0821   BUN 10 05/05/2016 0821   CREATININE 0.93 05/05/2016 0821      Component Value Date/Time   CALCIUM 9.1 05/05/2016 0821   ALKPHOS 39 03/21/2016 1637   AST 22 03/21/2016 1637   ALT 29 03/21/2016 1637   BILITOT 1.3 (H) 03/21/2016 1637       Nm Bone Scan Whole Body  Result Date: 03/21/2017 CLINICAL DATA:  History of prostate cancer. EXAM: NUCLEAR MEDICINE WHOLE BODY BONE SCAN TECHNIQUE: Whole body anterior and posterior images were obtained approximately 3 hours after intravenous injection of radiopharmaceutical. RADIOPHARMACEUTICALS:  21.6 mCi Technetium-62m MDP IV COMPARISON:  Body CT 03/21/2017 FINDINGS: Appropriate radiotracer distribution throughout soft tissues and renal collecting systems. No evidence of suspicious increased radiotracer uptake within the axial or appendicular skeleton. Focal increased uptake in the medial compartment of the right knee joint, likely degenerative. IMPRESSION: No evidence of skeletal metastatic disease. Electronically Signed   By: Fidela Salisbury M.D.   On: 03/21/2017 14:25    Assessment and Plan:   82 year old gentleman with prostate cancer diagnosed in 2017.  He had a Gleason score 4+3 = 7 and a PSA of 5.7.  He underwent local ablative therapy (HIFU) under the care of Dr. Eliberto Ivory in 2017.  His PSA nadir was 1.1.  His PSA 2018 showed a rise up to 2.33 in November 2018 after he was 1.7 in July 2018.  A biopsy showed high volume disease on the right side Gleason score 4+3 = 7 and at least 6 cores.  His case was discussed today in the prostate cancer multidisciplinary clinic.  His pathology was reviewed with the reviewing pathologist.  Imaging studies including bone scan was also discussed with radiology and showed no  evidence of metastatic disease.  The natural course of this disease  was discussed today with the patient and treatment options were also reviewed.  He understands given his local therapy more definitive therapy at this time will be more complicated and can cause more side effects.  These options would include primary surgical therapy versus radiation therapy with at least 6 months of androgen depravation.  We also discussed the possibility if no local therapy is done.  He does have a risk of developing metastatic disease over a period of time.  The pattern of metastasis for this cancer would include bony metastasis possible lymph node metastasis among others.  At that point androgen deprivation therapy will be used among other treatments.  Those modalities are not curative but rather palliative at that point.    I explained to him that his disease can be palliated without definitive local therapy in the future for an extended period of time.  Life expectancy could be in the years with man with advanced prostate cancer without undergo multiple lines of therapy.  All his questions were answered today to his satisfaction and he will think about his options today including definitive local therapy.

## 2017-04-06 NOTE — Consult Note (Signed)
Multi-Disciplinary Clinic 04/06/2017    Zachary Green         MRN: [id number]  PRIMARY CARE:  [name], MD  DOB: 01-24-1936, 82 year old Male  REFERRING:  Lillette Boxer. Diona Fanti, MD  SSN: [id number]  PROVIDER:  Franchot Gallo, M.D.    TREATING:  Raynelle Bring, M.D.    LOCATION:  [facility] [phone]    CC/HPI: CC: Prostate Cancer   Physician requesting consult: Dr. Franchot Gallo  PCP: Dr. [name]  Location of consult: [facility]   Zachary Green is a 82 year old gentleman who was found to have an elevated PSA of 5.7 in 2017. He was evaluated by Dr. Mare Ferrari in [place], [place] and underwent a TRUS biopsy of the prostate on 07/26/15 that confirmed Gleason 3+4=7 adenocarcinoma of the prostate in 4 out of 12 biopsy cores. His biopsy was a standard 12 core biopsy and all positive cores were located on the left side of the prostate. His DRE was reportedly unremarkable at his initial evaluation indicating clinical stage T1c prostate cancer. He had genomic testing with a Decipher test that indicated a score of 0.94 indicating a very high risk of harboring at least primary pattern Gleason 4 disease.   He underwent Greenlight laser TUVP by Dr. Rogers Blocker and underwent attempted curative therapy with HIFU hemiablation of the left side of the prostate on 10/08/15. His PSA decreased to 1.1 following his TUVP and HIFU. I am not aware that the patient had a PSA checked between his TUVP and HIFU procedure as a baseline before his cancer treatment. He presented to Dr. Diona Fanti for a 2nd opinion in July 2018. This was mainly prompted by his worsening lower urinary tract symptoms. His PSA at that time had increased to 1.7 and his DRE was abnormal but without a clear nodule or induration. He apparently was noted to have symptoms consistent with a stricture and underwent a dilation. His symptoms initially improved.  However,he developed progressive LUTS despite alpha blocker therapy again over the next few months. His IPPS was 19 at the time. In November 2018, his PSA had further increased to 2.33 and his LUTS had worsened prompting cystoscopy that revealed a bladder neck stenosis requiring urethral dilation.   He underwent a repeat TRUS biopsy of the prostate by Dr. Diona Fanti on 02/23/17 due to his rising PSA and abnormal DRE and was found to have a 7 cc gland with Gleason 4+3=7 adenocarcinoma and perineural invasion including 9 out of 12 biopsy cores positive for malignancy. 3 positive cores were noted on the ablated left side of the prostate and all 6 right sided cores were positive as well.   He has undergone staging with a CT scan and bone scan that were negative for metastatic disease on 03/21/17.   Family history: None.   Imaging studies:  Bone scan (03/21/17) - negative for metastatic disease  CT (03/21/17) - negative for metastatic disease   PMH: He has a history of atrial fibrillation (takes amiodorone, metoprolol, and Eliquis), GERD, hyperlipidemia, and hypertension. He has a long history of tobacco use (he has a stable small nodule that has been noted on previous chest imaging but has not been enlarging and is thought to be benign).  PSH: No abdominal surgeries.   TNM stage: cT1c N0 M0  PSA: 2.33  Gleason score: 4+3=7  Biopsy (02/23/17): /12 cores positive  Left: L apex (10%,  4+3=7), L lateral mid (20%, 4+3=7), L base (5%, 3+4=7)  Right: R apex (10%, 3+4=7), R lateral apex (10%, 3+4=7), R mid (10%, 4+3=7), R mid lateral (20%, 3+4=7), R base (10%, 4+3=7, PNI), R lateral base (30%, 4+3=7, PNI)  Prostate volume: 7 cc   Urinary function: IPSS is 8.  Erectile function: He and his wife have not been sexually active for multiple years.     ALLERGIES: codiene - Nausea    MEDICATIONS: Augmentin 875 mg-125 mg tablet 1 tablet PO the morning of biopsy  Metoprolol Succinate 25 mg tablet, extended  release 24 hr  Nexium 20 mg capsule,delayed release  Alfuzosin Hcl Er 10 mg tablet, extended release 24 hr  Amiodarone Hcl 100 mg tablet  Docusate Sodium 100 mg capsule  Eliquis 5 mg tablet  Fish Oil  Furosemide 20 mg tablet  Hydroxyzine Hcl 25 mg tablet  Losartan Potassium 50 mg tablet  Multivitamin  Pravastatin Sodium 40 mg tablet  Preservision Areds  Sucralfate 1 gram tablet  Systane  Tramadol Hcl 50 mg tablet  Uribel 118 mg-10 mg-40.8 mg-36 mg-0.12 mg capsule     GU PSH: Cysto Dilate Stricture (M or F) - 11/15/2016 Locm 300-399Mg /Ml Iodine,1Ml - 03/21/2017 Prostate Needle Biopsy - 02/22/2017 Subsequent Male VB Sounds - 01/26/2017    NON-GU PSH: Surgical Pathology, Gross And Microscopic Examination For Prostate Needle - 02/22/2017        GU PMH: BPH w/LUTS - 02/22/2017, Symptoms are somewhat better with dilation at last visit. He still has a fair residual volume. He was dilated again today., - 01/26/2017, Significant lower urinary tract symptomatology. Large residual urine volume, bladder neck contracture. This was dilated today, - 11/15/2016 Prostate Cancer - 02/22/2017, We will need to recheck his PSA again today., - 01/26/2017, Initial biopsy performed in May, 2017. Intermediate risk prostate cancer--4 cores on the right prostate positive for Gleason 3+4 = 7 adenocarcinoma. Following greenlight laser prostatectomy, HIFU was performed in July, 2017. The patient has had a slowly rising PSA., - 11/15/2016 Bladder-neck stenosis/contracture (Improving), Voiding somewhat better with dilation at last visit. - 01/26/2017, Moderate bladder neck contracture, dilated., - 11/15/2016 History of prostate cancer    NON-GU PMH: Atrial Fibrillation GERD Hypercholesterolemia Hypertension    FAMILY HISTORY: 1 Daughter - Daughter 3 Son's - Son Death In The Family Father - Father Death In The Family Mother - Mother Dementia - Mother   SOCIAL HISTORY: Marital Status: Married Preferred Language:  English; Ethnicity: Not Hispanic Or Latino; Race: White Current Smoking Status: Patient does not smoke anymore. Has not smoked since 10/26/2011. Smoked for 58 years. Smoked 1 pack per day.  <DIV'  Tobacco Use Assessment Completed:  Used Tobacco in last 30 days?   Has never drank.  Does not drink caffeine. Patient's occupation is/was retired.    REVIEW OF SYSTEMS:     GU Review Male:  Patient denies trouble starting your streams, have to strain to urinate , leakage of urine, hard to postpone urination, stream starts and stops, frequent urination, get up at night to urinate, and burning/ pain with urination.    Gastrointestinal (Upper):  Patient denies nausea and vomiting.    Gastrointestinal (Lower):  Patient denies diarrhea and constipation.    Constitutional:  Patient denies fever, night sweats, weight loss, and fatigue.    Skin:  Patient denies skin rash/ lesion and itching.    Eyes:  Patient denies blurred vision and double vision.    Ears/ Nose/ Throat:  Patient denies sore throat  and sinus problems.    Hematologic/Lymphatic:  Patient denies swollen glands and easy bruising.    Cardiovascular:  Patient denies leg swelling and chest pains.    Respiratory:  Patient denies cough and shortness of breath.    Endocrine:  Patient denies excessive thirst.    Musculoskeletal:  Patient denies back pain and joint pain.    Neurological:  Patient denies headaches and dizziness.    Psychologic:  Patient denies depression and anxiety.    VITAL SIGNS: None     MULTI-SYSTEM PHYSICAL EXAMINATION:      Constitutional: Well-nourished. No physical deformities. Normally developed. Good grooming.            PAST DATA REVIEWED:   Source Of History:  Patient  Lab Test Review:  PSA  Records Review:  Pathology Reports, Previous Patient Records  X-Ray Review: C.T. Abdomen/Pelvis: Reviewed Films.  Bone Scan: Reviewed Films.      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 Recurrent prostate cancer s/p failed primary therapy with HIFU: I had a detailed discussion with Mr. Guin, his wife, and his son today. We discussed his situation in detail and the options for salvage therapy. We discussed the options of therapy of curative intent versus ongoing surveillance with systemic therapy in the future and he is scheduled to see Dr. Alen Blew in medical oncology later this morning.   With regard to curative treatment options, although we discussed the option of surgical removal of the prostate which would likely eliminate the issue of recurrent stricture disease, he understands that this would be with significant risk of urinary incontinence long-term. We also discussed some of the increased risks and perioperative morbidity considering his advanced age. I therefore did not recommend surgical therapy as his best option. We also discussed the option of salvage radiation therapy which would likely be the most definitive treatment for his recurrent disease. He understands that it would be recommended to proceed with 4-6 months of androgen deprivation therapy with radiation therapy. Considering the extremely small size of his prostate, this would be best accomplished with intensity modulated radiation therapy. Technically, he would not be a candidate for a radiation seed implant. We did discuss that deviation therapy would have an excellent chance for providing curative treatment. However, he also understands there would be significant risk of having recurrent and ongoing stricture disease that could require him to perform intermittent catheterization long term. Finally, we discussed the option of repeat ablative therapy. Considering the fact that he  has failed ablative therapy, even on the left side of the prostate, he is understandably somewhat hesitant to proceed with further ablative treatment. He also understands that ablative therapy could increase the risk of recurrent stricture disease as this is likely the cause of his current stricture disease.   He feels very well informed and appears to be inclined to consider salvage radiation therapy with short-term androgen deprivation. I will notify Dr. Diona Fanti of his decision so that he may begin androgen deprivation. He will also need relatively close follow-up for his stricture disease.   2. Bladder neck stenosis: He understands the increased risk of recurrent stricture disease with  radiation therapy although this may be an issue regardless of the areas already required to dilation procedures. He may benefit from balloon dilation in the future which may have a slightly higher risk for long-term patency with the radial dilation thereby avoiding the shearing force of serial dilation. He understands the potential risk of needing to perform self intermittent catheterization in the future if this becomes an ongoing issue. He appears prepared for this possibility.   Cc: Dr. Franchot Gallo  Dr. [name]  Dr. Tyler Pita  Dr. Zola Button

## 2017-04-06 NOTE — Progress Notes (Signed)
Radiation Oncology         [phone] ________________________________  Multidisciplinary Prostate Cancer Clinic  Initial Radiation Oncology Consultation  Name: Zachary Green MRN: [id number]  Date: 04/06/2017  DOB: [birth year]  GY:[id number], Lucianne Muss, MD  Raynelle Bring, MD   REFERRING PHYSICIAN: Raynelle Bring, MD  DIAGNOSIS: 82 y.o. gentleman with stage T1c adenocarcinoma of the prostate with a Gleason's score of 4+3 and a PSA of 2.33 s/p prostate HIFU in 2017.    ICD-10-CM   1. Malignant neoplasm of prostate ([place]) C61     HISTORY OF PRESENT ILLNESS::Zachary Green is a 82 y.o. gentleman.  He has a history of elevated PSA of 5.7 in 05/2015 with Dr. Mare Ferrari in [place].  The patient proceeded to transrectal ultrasound with 12 biopsies of the prostate on 07/26/2015.  The prostate volume measured 45 cc.  Out of 12 core biopsies, 4 were positive.  The maximum Gleason score was 3+4, and this was seen in the left base, left base lateral, left mid, and left mid lateral.  He was initially treated with laser TURP in 07/2015 followed by HIFU in [place] in 09/2015. PSA nadir was 1.1 but has continued to gradually rise since the time of procedure with most recent PSA 2.3 in 01/2017.  He transferred his care to Dr. Diona Fanti for continued observation and proceeded to repeat TRUSPBx on 02/22/17.  The prostate volume measured 7 cc.  Out of 12 core biopsies, 9 were positive.  The maximum Gleason score was 4+3, and this was seen in the left mid lateral, left apex, right base, right mid, and right base lateral. He did recently require office cystoscopy for dilation of bladder neck contracture, performed by Dr. Diona Fanti on 11/15/16 and providing significant relief of BOO sxs. Biopsies of prostate revealed:    CT Pelvis and Bone scan were performed for disease staging on 03/21/2017. On CT pelvis there was no lymph adenopathy or concerning lesions to indicate evidence for prostatic disease. Bone  scan was without any concerning lesions for osseous metastatic disease.  The patient reviewed the biopsy results with his urologist and he has kindly been referred today to the multidisciplinary prostate cancer clinic for presentation of pathology and radiology studies in our conference for discussion of potential radiation treatment options and clinical evaluation. His recent CT and bone scans were reviewed and showed no evidence of nodal or skeletal metastatic disease.  PREVIOUS RADIATION THERAPY: No  PAST MEDICAL HISTORY:  has a past medical history of Arthritis, BPH (benign prostatic hypertrophy), Chronic diastolic CHF (congestive heart failure) ([place]), Diverticulitis, ED (erectile dysfunction), GERD (gastroesophageal reflux disease), Hyperlipidemia, Hypertension, Impaired fasting glucose, Persistent atrial fibrillation ([place]), Personal history of colonic adenoma (05/23/2007), Prostate cancer ([place]), Rosacea, and Schatzki's ring.    PAST SURGICAL HISTORY: Past Surgical History:  Procedure Laterality Date  . COLONOSCOPY    . ELECTROPHYSIOLOGIC STUDY N/A 03/28/2016   Procedure: CARDIOVERSION;  Surgeon: Minna Merritts, MD;  Location: [facility] ORS;  Service: Cardiovascular;  Laterality: N/A;  . ESOPHAGOGASTRODUODENOSCOPY    . ESOPHAGOGASTRODUODENOSCOPY    . ESOPHAGOGASTRODUODENOSCOPY (EGD) WITH PROPOFOL N/A 08/18/2016   Procedure: ESOPHAGOGASTRODUODENOSCOPY (EGD) WITH PROPOFOL;  Surgeon: Lollie Sails, MD;  Location: [facility] ENDOSCOPY;  Service: Endoscopy;  Laterality: N/A;  . GREEN LIGHT LASER TURP (TRANSURETHRAL RESECTION OF PROSTATE N/A 08/17/2015   Procedure: GREEN LIGHT LASER TURP (TRANSURETHRAL RESECTION OF PROSTATE;  Surgeon: Royston Cowper, MD;  Location: [facility] ORS;  Service: Urology;  Laterality: N/A;  . NM MYOVIEW LTD    .  PATELLA FRACTURE SURGERY     pin placed 1984  . REFRACTIVE SURGERY    . UPPER ESOPHAGEAL ENDOSCOPIC ULTRASOUND (EUS) N/A 09/21/2016   Procedure: UPPER ESOPHAGEAL  ENDOSCOPIC ULTRASOUND (EUS);  Surgeon: Reita Cliche, MD;  Location: [facility] ENDOSCOPY;  Service: Gastroenterology;  Laterality: N/A;    FAMILY HISTORY: family history includes Alzheimer's disease in his mother; Cancer in his mother; Diabetes in his brother.  SOCIAL HISTORY:  reports that he quit smoking about 58 years ago. His smoking use included cigarettes. He has a 7.00 pack-year smoking history. he has never used smokeless tobacco. He reports that he does not drink alcohol or use drugs.  ALLERGIES: Codeine  MEDICATIONS:  Current Outpatient Medications  Medication Sig Dispense Refill  . amiodarone (PACERONE) 100 MG tablet Take 1 tablet (100 mg total) by mouth daily. 90 tablet 3  . docusate sodium (COLACE) 100 MG capsule Take 2 capsules (200 mg total) by mouth 2 (two) times daily. (Patient taking differently: Take 100 mg by mouth daily. ) 120 capsule 3  . ELIQUIS 5 MG TABS tablet TAKE 1 TABLET BY MOUTH TWICE A DAY 60 tablet 1  . esomeprazole (NEXIUM) 20 MG capsule Take 20 mg by mouth daily at 12 noon.     . furosemide (LASIX) 20 MG tablet TAKE 1 TABLET (20 MG TOTAL) BY MOUTH DAILY. 90 tablet 0  . losartan (COZAAR) 50 MG tablet Take 50 mg by mouth every morning.    . metoprolol succinate (TOPROL-XL) 25 MG 24 hr tablet Take 0.5 tablets (12.5 mg total) by mouth daily. 30 tablet 5  . metroNIDAZOLE (METROGEL) 1 % gel APP 1 GRAM TOPICALLY ON THE SKIN HS    . Multiple Vitamin (MULTI-VITAMINS) TABS Take 1 tablet by mouth daily.     . Omega-3 Fatty Acids (FISH OIL PO) Take 1 capsule by mouth 2 (two) times daily.    . permethrin (ELIMITE) 5 % cream Apply 1 application topically 2 (two) times daily.     [name] Glycol-Propyl Glycol (SYSTANE OP) Place 1 drop into both eyes daily as needed (dry eyes).    . pravastatin (PRAVACHOL) 40 MG tablet TAKE ONE TABLET BY MOUTH EVERY DAY (Patient taking differently: TAKE ONE TABLET BY MOUTH EVERY EVENING) 90 tablet 3   No current facility-administered  medications for this encounter.     REVIEW OF SYSTEMS:  On review of systems, the patient reports that he is doing well overall. He denies any chest pain, shortness of breath, cough, fevers, chills, night sweats, or unintended weight changes. He denies any bowel disturbances, and denies abdominal pain, nausea or vomiting. He reports muscle aches. His IPSS was 8, indicating moderate urinary symptoms of incomplete emptying, frequency, intermittency, urgency, weak stream, and nocturia x1. He reports abnormal sensation with urination. He has ED but is not sexually active. A complete review of systems is obtained and is otherwise negative.   PHYSICAL EXAM:  Wt Readings from Last 3 Encounters:  04/06/17 200 lb 9.6 oz (91 kg)  04/05/17 194 lb 8 oz (88.2 kg)  09/21/16 184 lb (83.5 kg)   Temp Readings from Last 3 Encounters:  04/06/17 98 F (36.7 C) (Oral)  09/21/16 (!) 96.2 F (35.7 C)  08/18/16 (!) 96.7 F (35.9 C) (Tympanic)   BP Readings from Last 3 Encounters:  04/06/17 (!) 162/72  04/05/17 124/72  09/21/16 101/65   Pulse Readings from Last 3 Encounters:  04/06/17 (!) 48  04/05/17 (!) 54  09/21/16 70   Pain  Assessment Pain Score: 0-No pain/10  In general this is a well appearing caucasian male in no acute distress. He is alert and oriented x4 and appropriate throughout the examination. HEENT reveals that the patient is normocephalic, atraumatic. EOMs are intact. PERRLA. Skin is intact without any evidence of gross lesions. Cardiovascular exam reveals a regular rate and rhythm, no clicks rubs or murmurs are auscultated. Chest is clear to auscultation bilaterally. Lymphatic assessment is performed and does not reveal any adenopathy in the cervical, supraclavicular, axillary, or inguinal chains. Abdomen has active bowel sounds in all quadrants and is intact. The abdomen is soft, non tender, non distended. Lower extremities are negative for pretibial pitting edema, deep calf tenderness,  cyanosis or clubbing.  KPS = 90  100 - Normal; no complaints; no evidence of disease. 90   - Able to carry on normal activity; minor signs or symptoms of disease. 80   - Normal activity with effort; some signs or symptoms of disease. 71   - Cares for self; unable to carry on normal activity or to do active work. 60   - Requires occasional assistance, but is able to care for most of his personal needs. 50   - Requires considerable assistance and frequent medical care. 47   - Disabled; requires special care and assistance. 28   - Severely disabled; hospital admission is indicated although death not imminent. 34   - Very sick; hospital admission necessary; active supportive treatment necessary. 10   - Moribund; fatal processes progressing rapidly. 0     - Dead  Karnofsky DA, [place]mann [place], Craver LS and [place] [facility] [phone]) The use of the nitrogen mustards in the palliative treatment of carcinoma: with particular reference to bronchogenic carcinoma Cancer 1 [id number]   LABORATORY DATA:  Lab Results  Component Value Date   WBC 4.8 03/23/2016   HGB 15.3 03/23/2016   HCT 44.4 03/23/2016   MCV 92 03/23/2016   PLT 193 03/23/2016   Lab Results  Component Value Date   NA 137 05/05/2016   K 4.2 05/05/2016   CL 96 05/05/2016   CO2 23 05/05/2016   Lab Results  Component Value Date   ALT 29 03/21/2016   AST 22 03/21/2016   ALKPHOS 39 03/21/2016   BILITOT 1.3 (H) 03/21/2016     RADIOGRAPHY: Nm Bone Scan Whole Body  Result Date: 03/21/2017 CLINICAL DATA:  History of prostate cancer. EXAM: NUCLEAR MEDICINE WHOLE BODY BONE SCAN TECHNIQUE: Whole body anterior and posterior images were obtained approximately 3 hours after intravenous injection of radiopharmaceutical. RADIOPHARMACEUTICALS:  21.6 mCi Technetium-22m MDP IV COMPARISON:  Body CT 03/21/2017 FINDINGS: Appropriate radiotracer distribution throughout soft tissues and renal collecting systems. No evidence of suspicious increased  radiotracer uptake within the axial or appendicular skeleton. Focal increased uptake in the medial compartment of the right knee joint, likely degenerative. IMPRESSION: No evidence of skeletal metastatic disease. Electronically Signed   By: Fidela Salisbury M.D.   On: 03/21/2017 14:25      IMPRESSION/PLAN: 82 y.o. gentleman with an unfavorable intermediate risk, stage T1c adenocarcinoma of the prostate with a PSA of 2.33 and a Gleason score of 4+3 s/p prostate HIFU in 2017.   We discussed the patient's workup and outlines the nature of prostate cancer in this setting. The patient's T stage, Gleason's score, and PSA put him into the unfavorable intermediate risk group. He is not a candidate for brachytherapy, given his prior prostate laser vaporization and HIFU hemiablation procedures.  Accordingly, he is  eligible for 8 weeks of external radiation. We discussed the available radiation techniques, and focused on the details and logistics and delivery. We discussed the potential for consideration of hyper fractionated treatment at 28 fractions instead of the traditional 40 fractions given the significantly reduced size of his prostate gland at this point. The patient is most interested in a 5-1/2 week course of daily radiotherapy if this is felt to be appropriate since he will be traveling from [place] for his treatments. We discussed and outlined the risks, benefits, short and long-term effects associated with radiotherapy and compared and contrasted these with prostatectomy. We did discuss the potential risk for recurrent bladder neck contracture requiring further cystoscopy dilation in the future or potential CIC.  We also detailed the role of ADT in the treatment of unfavorable intermediate prostate cancer and outlined the associated side effects that could be expected with this therapy.  At the end of the conversation the patient is interested in moving forward with ST-ADT followed by 8 weeks of  external radiotherapy which we would anticipate beginning after the patient has been on ADT for approximately 2 months. He has not yet received his first Lupron injection. We will schedule him with Dr. Diona Fanti to begin within the next week. He will then be scheduled for simulation in early/mid-March to proceed with prostate IMRT.     Nicholos Johns, PA-C    Tyler Pita, MD  [facility] Direct Dial: [phone]  Fax: [phone] [email or web address]  Skype  [email or web address]  This document serves as a record of services personally performed by Tyler Pita, MD and Freeman Caldron, PA-C. It was created on their behalf by Rae Lips, a trained medical scribe. The creation of this record is based on the scribe's personal observations and the providers' statements to them. This document has been checked and approved by the attending providers.

## 2017-04-09 ENCOUNTER — Telehealth: Payer: Self-pay | Admitting: *Deleted

## 2017-04-09 NOTE — Telephone Encounter (Signed)
Called patient to inform of ADT appt. For 04-10-17 @ 1:30 pm @ Dr. Alan Ripper Off[place]ice, lvm for a return call

## 2017-04-12 ENCOUNTER — Telehealth: Payer: Self-pay | Admitting: *Deleted

## 2017-04-12 NOTE — Telephone Encounter (Signed)
CALLED PATIENT TO UPDATE THAT HIS ADT INJ. IS FOR 05-18-17 @ 3 PM, TOLD PATIENT THAT I HAVE REQUESTED AN EARLER DAY AND TIME, I AM WAITING ON A RETURN CALL FROM DR. DAHLSTEDT'S NURSE- JENNIFER, PT. VERIFIED UNDERSTANDING THIS

## 2017-04-16 ENCOUNTER — Telehealth: Payer: Self-pay | Admitting: Medical Oncology

## 2017-04-16 ENCOUNTER — Telehealth: Payer: Self-pay | Admitting: *Deleted

## 2017-04-16 NOTE — Telephone Encounter (Signed)
CALLED PATIENT TO INFORM OF SIM APPT. FOR 05-24-17 @ 11 AM @ DR. MANNING'S OFFICE, SPOKE WITH PATIENT AND HE IS AWARE OF THIS APPT.

## 2017-04-16 NOTE — Telephone Encounter (Signed)
Called as follow up to [facility]. Zachary Green received [name] 04/10/17 and his injections sites have been red, swollen and painful. Today, he is beginning to see improvement. We discussed this is common side effects. He is scheduled for CT simulation 05/24/17. I will continue to follow.

## 2017-04-19 ENCOUNTER — Telehealth: Payer: Self-pay | Admitting: Medical Oncology

## 2017-04-19 ENCOUNTER — Other Ambulatory Visit: Payer: Self-pay | Admitting: Physician Assistant

## 2017-04-19 NOTE — Telephone Encounter (Signed)
Zachary Green called with concerns about the hard knots in his abdomen where he got [place]. We discussed that the injection sites can be red, hot to touch and painful. These are normal side effects and with time they will improve. He states they have improved but he still has the hard spots.

## 2017-05-10 ENCOUNTER — Other Ambulatory Visit: Payer: Self-pay | Admitting: Cardiovascular Disease

## 2017-05-22 ENCOUNTER — Telehealth: Payer: Self-pay | Admitting: Medical Oncology

## 2017-05-22 NOTE — Telephone Encounter (Signed)
Called patient and spoke with wife to see if he would be willing to be interviewed after his CT simulation about his experience. I explained that we would like to understand what the patients are experiencing so that we ultimately make improvements for our patients. They are in agreement and I notified Candace- rad/onc.

## 2017-05-24 ENCOUNTER — Encounter: Payer: Self-pay | Admitting: Medical Oncology

## 2017-05-24 ENCOUNTER — Encounter: Payer: Self-pay | Admitting: Urology

## 2017-05-24 ENCOUNTER — Ambulatory Visit
Admission: RE | Admit: 2017-05-24 | Discharge: 2017-05-24 | Disposition: A | Discharge status: Home / Self Care | Payer: Medicare Other | Source: Ambulatory Visit | Attending: Radiation Oncology | Admitting: Radiation Oncology

## 2017-05-24 DIAGNOSIS — C61 Malignant neoplasm of prostate: Secondary | ICD-10-CM | POA: Diagnosis not present

## 2017-05-24 DIAGNOSIS — Z510 Encounter for antineoplastic radiation therapy: Secondary | ICD-10-CM | POA: Insufficient documentation

## 2017-05-24 NOTE — Progress Notes (Signed)
  Radiation Oncology         (336) [id number] ________________________________  Name: Zachary Green MRN: [id number]  Date: 05/24/2017  DOB: 1935-11-14  SIMULATION AND TREATMENT PLANNING NOTE    ICD-10-CM   1. Malignant neoplasm of prostate ([place]) C61     DIAGNOSIS:  82 y.o. gentleman with stage T1c adenocarcinoma of the prostate with a Gleason's score of 4+3 and a PSA of 2.33 s/p prostate HIFU in 2017.  NARRATIVE:  The patient was brought to the [facility].  Identity was confirmed.  All relevant records and images related to the planned course of therapy were reviewed.  The patient freely provided informed written consent to proceed with treatment after reviewing the details related to the planned course of therapy. The consent form was witnessed and verified by the simulation staff.  Then, the patient was set-up in a stable reproducible supine position for radiation therapy.  A vacuum lock pillow device was custom fabricated to position his legs in a reproducible immobilized position.  Then, urethrogram was performed to see the prostatic apex.  CT images were obtained.  Surface markings were placed.  The CT images were loaded into the planning software.  Then the prostate target and avoidance structures including the rectum, bladder, bowel and hips were contoured.  Treatment planning then occurred.  The radiation prescription was entered and confirmed.  A total of one complex treatment devices was fabricated. I have requested : Intensity Modulated Radiotherapy (IMRT) is medically necessary for this case for the following reason:  Rectal sparing.Marland Kitchen  PLAN:  The patient will receive 70 Gy in 28 fractions hypofractionation.  ________________________________  Sheral Apley Tammi Klippel, M.D.   This document serves as a record of services personally performed by Zachary Pita, MD. It was created on his behalf by Bethann Humble, a trained medical scribe. The creation of this record is based on  the scribe's personal observations and the provider's statements to them. This document has been checked and approved by the attending provider.

## 2017-05-24 NOTE — Progress Notes (Signed)
Patient presented today for radiation treatment planning CT simulation for prostate IMRT.  On review of his CT scan, it was noted that he had a significantly distended bladder.  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 He did require office cystoscopy for dilation of bladder neck contracture, performed by Dr. Diona Fanti on 11/15/16 which provided significant relief of BOO sxs.  Today, he reports that he has noticed weakening of his force of stream and inability to completely empty his bladder but denies suprapubic discomfort, dysuria or gross hematuria.  He is not bothered with the symptoms at this point.  He indicates that Dr. Diona Fanti told him to call if he continues to have progressive lower urinary tract symptoms as he could potentially require repeat dilation.  He does not feel like he is at the point that he needs another dilation however, we did discuss the potential increase in bladder outlet obstructive symptoms with radiation therapy.  He is currently scheduled to begin radiation treatments 06/01/17.  We will follow his progress closely and will have a low threshold for referral back to Dr. Diona Fanti should his symptoms progress throughout his treatment.  He is in agreement and is comfortable with this plan.   Nicholos Johns, PA-C

## 2017-06-04 ENCOUNTER — Ambulatory Visit
Admission: RE | Admit: 2017-06-04 | Discharge: 2017-06-04 | Disposition: A | Discharge status: Home / Self Care | Payer: Medicare Other | Source: Ambulatory Visit | Attending: Radiation Oncology | Admitting: Radiation Oncology

## 2017-06-04 DIAGNOSIS — C61 Malignant neoplasm of prostate: Secondary | ICD-10-CM | POA: Insufficient documentation

## 2017-06-04 DIAGNOSIS — Z510 Encounter for antineoplastic radiation therapy: Secondary | ICD-10-CM | POA: Insufficient documentation

## 2017-06-05 ENCOUNTER — Ambulatory Visit
Admission: RE | Admit: 2017-06-05 | Discharge: 2017-06-05 | Disposition: A | Discharge status: Home / Self Care | Payer: Medicare Other | Source: Ambulatory Visit | Attending: Radiation Oncology | Admitting: Radiation Oncology

## 2017-06-05 DIAGNOSIS — Z510 Encounter for antineoplastic radiation therapy: Secondary | ICD-10-CM | POA: Diagnosis not present

## 2017-06-06 ENCOUNTER — Ambulatory Visit
Admission: RE | Admit: 2017-06-06 | Discharge: 2017-06-06 | Disposition: A | Discharge status: Home / Self Care | Payer: Medicare Other | Source: Ambulatory Visit | Attending: Radiation Oncology | Admitting: Radiation Oncology

## 2017-06-06 DIAGNOSIS — Z510 Encounter for antineoplastic radiation therapy: Secondary | ICD-10-CM | POA: Diagnosis not present

## 2017-06-06 NOTE — Progress Notes (Signed)
Pt here for patient teaching.  Pt given Radiation and You booklet.  Reviewed areas of pertinence such as diarrhea, fatigue and urinary and bladder changes . Pt able to give teach back of have Imodium on hand and drink plenty of water,. Pt demonstrated understanding, needs reinforcement, no evidence of learning, refused teaching and  of information given and will contact nursing with any questions or concerns.     Http://rtanswers.org/treatmentinformation/whattoexpect/index      

## 2017-06-07 ENCOUNTER — Ambulatory Visit
Admission: RE | Admit: 2017-06-07 | Discharge: 2017-06-07 | Disposition: A | Discharge status: Home / Self Care | Payer: Medicare Other | Source: Ambulatory Visit | Attending: Radiation Oncology | Admitting: Radiation Oncology

## 2017-06-07 DIAGNOSIS — Z510 Encounter for antineoplastic radiation therapy: Secondary | ICD-10-CM | POA: Diagnosis not present

## 2017-06-08 ENCOUNTER — Ambulatory Visit
Admission: RE | Admit: 2017-06-08 | Discharge: 2017-06-08 | Disposition: A | Discharge status: Home / Self Care | Payer: Medicare Other | Source: Ambulatory Visit | Attending: Radiation Oncology | Admitting: Radiation Oncology

## 2017-06-08 ENCOUNTER — Encounter: Payer: Self-pay | Admitting: Medical Oncology

## 2017-06-08 DIAGNOSIS — Z510 Encounter for antineoplastic radiation therapy: Secondary | ICD-10-CM | POA: Diagnosis not present

## 2017-06-08 NOTE — Progress Notes (Signed)
Zachary Green states he tolerated his first week of radiation without complaints. He reports that he had his teaching session and received the educational book about radiation. We discussed that he may not experience many side effects until later in the treatment process. He is aware that he will see Dr. Tammi Klippel once a week to follow up and discuss any side effects.

## 2017-06-11 ENCOUNTER — Ambulatory Visit: Payer: Medicare Other

## 2017-06-12 ENCOUNTER — Ambulatory Visit
Admission: RE | Admit: 2017-06-12 | Discharge: 2017-06-12 | Disposition: A | Discharge status: Home / Self Care | Payer: Medicare Other | Source: Ambulatory Visit | Attending: Radiation Oncology | Admitting: Radiation Oncology

## 2017-06-12 DIAGNOSIS — Z510 Encounter for antineoplastic radiation therapy: Secondary | ICD-10-CM | POA: Diagnosis not present

## 2017-06-13 ENCOUNTER — Ambulatory Visit
Admission: RE | Admit: 2017-06-13 | Discharge: 2017-06-13 | Disposition: A | Discharge status: Home / Self Care | Payer: Medicare Other | Source: Ambulatory Visit | Attending: Radiation Oncology | Admitting: Radiation Oncology

## 2017-06-13 DIAGNOSIS — Z510 Encounter for antineoplastic radiation therapy: Secondary | ICD-10-CM | POA: Diagnosis not present

## 2017-06-14 ENCOUNTER — Ambulatory Visit
Admission: RE | Admit: 2017-06-14 | Discharge: 2017-06-14 | Disposition: A | Discharge status: Home / Self Care | Payer: Medicare Other | Source: Ambulatory Visit | Attending: Radiation Oncology | Admitting: Radiation Oncology

## 2017-06-14 DIAGNOSIS — Z510 Encounter for antineoplastic radiation therapy: Secondary | ICD-10-CM | POA: Diagnosis not present

## 2017-06-15 ENCOUNTER — Other Ambulatory Visit: Payer: Self-pay | Admitting: Urology

## 2017-06-15 ENCOUNTER — Ambulatory Visit
Admission: RE | Admit: 2017-06-15 | Discharge: 2017-06-15 | Disposition: A | Discharge status: Home / Self Care | Payer: Medicare Other | Source: Ambulatory Visit | Attending: Radiation Oncology | Admitting: Radiation Oncology

## 2017-06-15 DIAGNOSIS — Z510 Encounter for antineoplastic radiation therapy: Secondary | ICD-10-CM | POA: Diagnosis not present

## 2017-06-15 MED ORDER — TAMSULOSIN HCL 0.4 MG PO CAPS: 0.4000 mg | ORAL_CAPSULE | Freq: Every day | ORAL | 5 refills | Status: AC

## 2017-06-18 ENCOUNTER — Ambulatory Visit
Admission: RE | Admit: 2017-06-18 | Discharge: 2017-06-18 | Disposition: A | Discharge status: Home / Self Care | Payer: Medicare Other | Source: Ambulatory Visit | Attending: Radiation Oncology | Admitting: Radiation Oncology

## 2017-06-18 DIAGNOSIS — Z510 Encounter for antineoplastic radiation therapy: Secondary | ICD-10-CM | POA: Diagnosis not present

## 2017-06-19 ENCOUNTER — Ambulatory Visit
Admission: RE | Admit: 2017-06-19 | Discharge: 2017-06-19 | Disposition: A | Discharge status: Home / Self Care | Payer: Medicare Other | Source: Ambulatory Visit | Attending: Radiation Oncology | Admitting: Radiation Oncology

## 2017-06-19 DIAGNOSIS — Z510 Encounter for antineoplastic radiation therapy: Secondary | ICD-10-CM | POA: Diagnosis not present

## 2017-06-20 ENCOUNTER — Ambulatory Visit
Admission: RE | Admit: 2017-06-20 | Discharge: 2017-06-20 | Disposition: A | Discharge status: Home / Self Care | Payer: Medicare Other | Source: Ambulatory Visit | Attending: Radiation Oncology | Admitting: Radiation Oncology

## 2017-06-20 DIAGNOSIS — Z510 Encounter for antineoplastic radiation therapy: Secondary | ICD-10-CM | POA: Diagnosis not present

## 2017-06-21 ENCOUNTER — Ambulatory Visit
Admission: RE | Admit: 2017-06-21 | Discharge: 2017-06-21 | Disposition: A | Discharge status: Home / Self Care | Payer: Medicare Other | Source: Ambulatory Visit | Attending: Radiation Oncology | Admitting: Radiation Oncology

## 2017-06-21 DIAGNOSIS — Z510 Encounter for antineoplastic radiation therapy: Secondary | ICD-10-CM | POA: Diagnosis not present

## 2017-06-22 ENCOUNTER — Ambulatory Visit
Admission: RE | Admit: 2017-06-22 | Discharge: 2017-06-22 | Disposition: A | Discharge status: Home / Self Care | Payer: Medicare Other | Source: Ambulatory Visit | Attending: Radiation Oncology | Admitting: Radiation Oncology

## 2017-06-22 ENCOUNTER — Encounter: Payer: Self-pay | Admitting: Medical Oncology

## 2017-06-22 DIAGNOSIS — Z510 Encounter for antineoplastic radiation therapy: Secondary | ICD-10-CM | POA: Diagnosis not present

## 2017-06-25 ENCOUNTER — Ambulatory Visit
Admission: RE | Admit: 2017-06-25 | Discharge: 2017-06-25 | Disposition: A | Discharge status: Home / Self Care | Payer: Medicare Other | Source: Ambulatory Visit | Attending: Radiation Oncology | Admitting: Radiation Oncology

## 2017-06-25 DIAGNOSIS — Z510 Encounter for antineoplastic radiation therapy: Secondary | ICD-10-CM | POA: Diagnosis not present

## 2017-06-25 DIAGNOSIS — C61 Malignant neoplasm of prostate: Secondary | ICD-10-CM | POA: Diagnosis not present

## 2017-06-26 ENCOUNTER — Ambulatory Visit
Admission: RE | Admit: 2017-06-26 | Discharge: 2017-06-26 | Disposition: A | Discharge status: Home / Self Care | Payer: Medicare Other | Source: Ambulatory Visit | Attending: Radiation Oncology | Admitting: Radiation Oncology

## 2017-06-26 ENCOUNTER — Other Ambulatory Visit: Payer: Self-pay | Admitting: Physician Assistant

## 2017-06-26 DIAGNOSIS — Z510 Encounter for antineoplastic radiation therapy: Secondary | ICD-10-CM | POA: Diagnosis not present

## 2017-06-27 ENCOUNTER — Ambulatory Visit
Admission: RE | Admit: 2017-06-27 | Discharge: 2017-06-27 | Disposition: A | Discharge status: Home / Self Care | Payer: Medicare Other | Source: Ambulatory Visit | Attending: Radiation Oncology | Admitting: Radiation Oncology

## 2017-06-27 DIAGNOSIS — Z510 Encounter for antineoplastic radiation therapy: Secondary | ICD-10-CM | POA: Diagnosis not present

## 2017-06-28 ENCOUNTER — Ambulatory Visit
Admission: RE | Admit: 2017-06-28 | Discharge: 2017-06-28 | Disposition: A | Discharge status: Home / Self Care | Payer: Medicare Other | Source: Ambulatory Visit | Attending: Radiation Oncology | Admitting: Radiation Oncology

## 2017-06-28 DIAGNOSIS — Z510 Encounter for antineoplastic radiation therapy: Secondary | ICD-10-CM | POA: Diagnosis not present

## 2017-06-29 ENCOUNTER — Ambulatory Visit
Admission: RE | Admit: 2017-06-29 | Discharge: 2017-06-29 | Disposition: A | Discharge status: Home / Self Care | Payer: Medicare Other | Source: Ambulatory Visit | Attending: Radiation Oncology | Admitting: Radiation Oncology

## 2017-06-29 DIAGNOSIS — Z510 Encounter for antineoplastic radiation therapy: Secondary | ICD-10-CM | POA: Diagnosis not present

## 2017-07-02 ENCOUNTER — Ambulatory Visit
Admission: RE | Admit: 2017-07-02 | Discharge: 2017-07-02 | Disposition: A | Discharge status: Home / Self Care | Payer: Medicare Other | Source: Ambulatory Visit | Attending: Radiation Oncology | Admitting: Radiation Oncology

## 2017-07-02 DIAGNOSIS — Z510 Encounter for antineoplastic radiation therapy: Secondary | ICD-10-CM | POA: Diagnosis not present

## 2017-07-03 ENCOUNTER — Ambulatory Visit
Admission: RE | Admit: 2017-07-03 | Discharge: 2017-07-03 | Disposition: A | Discharge status: Home / Self Care | Payer: Medicare Other | Source: Ambulatory Visit | Attending: Radiation Oncology | Admitting: Radiation Oncology

## 2017-07-03 DIAGNOSIS — Z510 Encounter for antineoplastic radiation therapy: Secondary | ICD-10-CM | POA: Diagnosis not present

## 2017-07-04 ENCOUNTER — Ambulatory Visit
Admission: RE | Admit: 2017-07-04 | Discharge: 2017-07-04 | Disposition: A | Discharge status: Home / Self Care | Payer: Medicare Other | Source: Ambulatory Visit | Attending: Radiation Oncology | Admitting: Radiation Oncology

## 2017-07-04 DIAGNOSIS — Z510 Encounter for antineoplastic radiation therapy: Secondary | ICD-10-CM | POA: Diagnosis not present

## 2017-07-05 ENCOUNTER — Ambulatory Visit
Admission: RE | Admit: 2017-07-05 | Discharge: 2017-07-05 | Disposition: A | Discharge status: Home / Self Care | Payer: Medicare Other | Source: Ambulatory Visit | Attending: Radiation Oncology | Admitting: Radiation Oncology

## 2017-07-05 DIAGNOSIS — Z510 Encounter for antineoplastic radiation therapy: Secondary | ICD-10-CM | POA: Diagnosis not present

## 2017-07-06 ENCOUNTER — Ambulatory Visit
Admission: RE | Admit: 2017-07-06 | Discharge: 2017-07-06 | Disposition: A | Discharge status: Home / Self Care | Payer: Medicare Other | Source: Ambulatory Visit | Attending: Radiation Oncology | Admitting: Radiation Oncology

## 2017-07-06 DIAGNOSIS — Z510 Encounter for antineoplastic radiation therapy: Secondary | ICD-10-CM | POA: Diagnosis not present

## 2017-07-09 ENCOUNTER — Ambulatory Visit
Admission: RE | Admit: 2017-07-09 | Discharge: 2017-07-09 | Disposition: A | Discharge status: Home / Self Care | Payer: Medicare Other | Source: Ambulatory Visit | Attending: Radiation Oncology | Admitting: Radiation Oncology

## 2017-07-09 DIAGNOSIS — Z510 Encounter for antineoplastic radiation therapy: Secondary | ICD-10-CM | POA: Diagnosis not present

## 2017-07-10 ENCOUNTER — Ambulatory Visit
Admission: RE | Admit: 2017-07-10 | Discharge: 2017-07-10 | Disposition: A | Discharge status: Home / Self Care | Payer: Medicare Other | Source: Ambulatory Visit | Attending: Radiation Oncology | Admitting: Radiation Oncology

## 2017-07-10 DIAGNOSIS — Z510 Encounter for antineoplastic radiation therapy: Secondary | ICD-10-CM | POA: Diagnosis not present

## 2017-07-11 ENCOUNTER — Ambulatory Visit
Admission: RE | Admit: 2017-07-11 | Discharge: 2017-07-11 | Disposition: A | Discharge status: Home / Self Care | Payer: Medicare Other | Source: Ambulatory Visit | Attending: Radiation Oncology | Admitting: Radiation Oncology

## 2017-07-11 ENCOUNTER — Ambulatory Visit: Payer: Medicare Other

## 2017-07-11 DIAGNOSIS — Z510 Encounter for antineoplastic radiation therapy: Secondary | ICD-10-CM | POA: Diagnosis not present

## 2017-07-12 ENCOUNTER — Ambulatory Visit
Admission: RE | Admit: 2017-07-12 | Discharge: 2017-07-12 | Disposition: A | Discharge status: Home / Self Care | Payer: Medicare Other | Source: Ambulatory Visit | Attending: Radiation Oncology | Admitting: Radiation Oncology

## 2017-07-12 ENCOUNTER — Encounter: Payer: Self-pay | Admitting: Medical Oncology

## 2017-07-12 ENCOUNTER — Encounter: Payer: Self-pay | Admitting: Radiation Oncology

## 2017-07-12 DIAGNOSIS — Z510 Encounter for antineoplastic radiation therapy: Secondary | ICD-10-CM | POA: Diagnosis not present

## 2017-07-12 NOTE — Progress Notes (Signed)
  Radiation Oncology         [phone] ________________________________  Name: Zachary Green MRN: [id number]  Date: 07/12/2017  DOB: 1935-08-01  End of Treatment Note  Diagnosis:   82 y.o. male with stage T1c adenocarcinoma of the prostate with a Gleason's score of 4+3 and a PSA of 2.33 s/p prostate HIFU in 2017     Indication for treatment:  Curative, Definitive Radiotherapy       Radiation treatment dates:   06/04/2017 - 07/12/2017  Site/dose:   The prostate was treated to 70 Gy in 28 fractions of 2.5 Gy  Beams/energy:   The patient was treated with IMRT using volumetric arc therapy delivering 6 MV X-rays to clockwise and counterclockwise circumferential arcs with a 90 degree collimator offset to avoid dose scalloping.  Image guidance was performed with daily cone beam CT prior to each fraction to align to gold markers in the prostate and assure proper bladder and rectal fill volumes.  Immobilization was achieved with BodyFix custom mold.  Narrative: The patient tolerated radiation treatment relatively well.   The patient experienced modest fatigue and some minor urinary irritation with symptoms of nocturia x2 and dysuria, managed with Flomax.  He denied any hematuria.  He denied any bowel issues.  Plan: The patient has completed radiation treatment. He will return to radiation oncology clinic for routine followup in one month. I advised him to call or return sooner if he has any questions or concerns related to his recovery or treatment. ________________________________  Sheral Apley. Tammi Klippel, M.D.  This document serves as a record of services personally performed by Tyler Pita, MD. It was created on his behalf by Rae Lips, a trained medical scribe. The creation of this record is based on the scribe's personal observations and the provider's statements to them. This document has been checked and approved by the attending provider.

## 2017-07-12 NOTE — Progress Notes (Signed)
Celebrated with Mr. Homan and his wife as he rang the bell after completion of radiation. He states he tolerate the treatments with minimal side effects. He voiced his appreciation for the staff and care that has made his experience enjoyable. He will follow up with Ashlyn-PA 08/14/17. I asked him to call with questions or concerns. He and his wife are planning a trip to their [place] to celebrate.

## 2017-08-14 ENCOUNTER — Encounter: Payer: Self-pay | Admitting: Urology

## 2017-08-14 ENCOUNTER — Ambulatory Visit
Admission: RE | Admit: 2017-08-14 | Discharge: 2017-08-14 | Disposition: A | Discharge status: Home / Self Care | Payer: Medicare Other | Source: Ambulatory Visit | Attending: Urology | Admitting: Urology

## 2017-08-14 ENCOUNTER — Other Ambulatory Visit: Payer: Self-pay

## 2017-08-14 VITALS — BP 165/77 | HR 53 | Temp 97.9°F | Resp 18 | Ht 67.0 in | Wt 201.2 lb

## 2017-08-14 DIAGNOSIS — Z923 Personal history of irradiation: Secondary | ICD-10-CM | POA: Insufficient documentation

## 2017-08-14 DIAGNOSIS — C61 Malignant neoplasm of prostate: Secondary | ICD-10-CM | POA: Diagnosis not present

## 2017-08-14 DIAGNOSIS — Z79899 Other long term (current) drug therapy: Secondary | ICD-10-CM | POA: Insufficient documentation

## 2017-08-14 NOTE — Addendum Note (Signed)
Encounter addended by: Malena Edman, RN on: 08/14/2017 4:54 PM  Actions taken: Charge Capture section accepted

## 2017-08-14 NOTE — Progress Notes (Signed)
Radiation Oncology         (336) [phone] ________________________________  Name: ARIS EVEN MRN: [id number]  Date: 08/14/2017  DOB: [birth year]  Post Treatment Note  CC: [name], MD  [name], MD  Diagnosis:   82 y.o. male with stage T1c adenocarcinoma of the prostate with a Gleason's score of 4+3 and a PSA of 2.33 s/p prostate HIFU in 2017     Interval Since Last Radiation:  4.5 weeks  06/04/2017 - 07/12/2017:  The prostate was treated to 70 Gy in 28 fractions of 2.5 Gy  Narrative:  The patient returns today for routine follow-up.  He tolerated radiation treatment relatively well.   The patient experienced modest fatigue and some minor urinary irritation with symptoms of nocturia x2 and dysuria, managed with Flomax.  He denied any hematuria.  He denied any bowel issues. He remained on ST-ADT throughout his course of radiotherapy (started 04/10/17).  On review of systems, the patient states that he is doing very well overall.  He has persistent LUTS with increased urinary hesitancy, intermittency, weak stream, and feelings of incomplete emptying, more noticable at night.  He also reports mild urgency and frequency with nocturia x2/night but denies gross hematuria, significant dysuria, incontinence, fever or chills  He has a h/o urethral stricture/bladder neck contracture s/p HIFU, requiring office cystoscopy for dilation 11/15/16 and has had persistent issues with bladder emptying since that time. His pre-treatment IPSS was 8.  He reports a healthy appetite and is maintaining his weight.  He denies any bowel complaints or abdominal pain. He has continued to tolerate the ADT well and thinks he is due for a repeat injection but does not have a scheduled appointment for this.  ALLERGIES:  is allergic to codeine.  Meds: Current Outpatient Medications  Medication Sig Dispense Refill  . amiodarone (PACERONE) 100 MG tablet TAKE 1 TABLET (100 MG TOTAL) BY MOUTH DAILY. 90 tablet  3  . docusate sodium (COLACE) 100 MG capsule Take 2 capsules (200 mg total) by mouth 2 (two) times daily. (Patient taking differently: Take 100 mg by mouth daily. ) 120 capsule 3  . doxycycline (ADOXA) 50 MG tablet Take 50 mg by mouth daily.    Marland Kitchen ELIQUIS 5 MG TABS tablet TAKE 1 TABLET BY MOUTH TWICE A DAY 60 tablet 3  . esomeprazole (NEXIUM) 20 MG capsule Take 20 mg by mouth daily at 12 noon.     . furosemide (LASIX) 20 MG tablet TAKE 1 TABLET (20 MG TOTAL) BY MOUTH DAILY. 90 tablet 0  . losartan (COZAAR) 50 MG tablet Take 50 mg by mouth every morning.    . metoprolol succinate (TOPROL-XL) 25 MG 24 hr tablet Take 0.5 tablets (12.5 mg total) by mouth daily. 30 tablet 5  . metroNIDAZOLE (METROGEL) 1 % gel APP 1 GRAM TOPICALLY ON THE SKIN HS    . Multiple Vitamin (MULTI-VITAMINS) TABS Take 1 tablet by mouth daily.     . Omega-3 Fatty Acids (FISH OIL PO) Take 1 capsule by mouth 2 (two) times daily.    [name] Glycol-Propyl Glycol (SYSTANE OP) Place 1 drop into both eyes daily as needed (dry eyes).    . pravastatin (PRAVACHOL) 40 MG tablet TAKE ONE TABLET BY MOUTH EVERY DAY (Patient taking differently: TAKE ONE TABLET BY MOUTH EVERY EVENING) 90 tablet 3  . tamsulosin (FLOMAX) 0.4 MG CAPS capsule Take 1 capsule (0.4 mg total) by mouth daily after supper. 30 capsule 5  . permethrin (ELIMITE) 5 %  cream Apply 1 application topically 2 (two) times daily.      No current facility-administered medications for this encounter.     Physical Findings:  height is 5\' 7"  (1.702 m) and weight is 201 lb 3.2 oz (91.3 kg). His oral temperature is 97.9 F (36.6 C). His blood pressure is 165/77 (abnormal) and his pulse is 53 (abnormal). His respiration is 18 and oxygen saturation is 100%.  Pain Assessment Pain Score: 0-No pain/10 In general this is a well appearing Caucasian male in no acute distress. He's alert and oriented x4 and appropriate throughout the examination. Cardiopulmonary assessment is negative for  acute distress and he exhibits normal effort.   Lab Findings: Lab Results  Component Value Date   WBC 4.8 03/23/2016   HGB 15.3 03/23/2016   HCT 44.4 03/23/2016   MCV 92 03/23/2016   PLT 193 03/23/2016     Radiographic Findings: No results found.  Impression/Plan: 1. 82 y.o. male with stage T1c adenocarcinoma of the prostate with a Gleason's score of 4+3 and a PSA of 2.33 s/p prostate HIFU in 2017. He will continue to follow up with urology for ongoing PSA determinations but does not currently have an appointment scheduled with Dr. Diona Fanti.  He had an appointment scheduled for 08/23/17 but this was cancelled by [facility] and he was told that someone would call him to reschedule.  He has not been contacted to reschedule as of yet and thinks he is due to repeat ADT injection in the near future. He would also likely benefit from a repeat bladder U/S for PVR to assess bladder emptying, given his history of urethral stricture/BNC. I advised him to call [facility] scheduling if he has not heard from Dr. Alan Ripper nurse by Friday 08/17/17.  He understands what to expect with regards to PSA monitoring going forward. I will look forward to following his response to treatment via correspondence with urology, and would be happy to continue to participate in his care if clinically indicated. I talked to the patient about what to expect in the future, including his risk for erectile dysfunction and rectal bleeding. I encouraged him to call or return to the office if he has any questions regarding his previous radiation or possible radiation side effects. He was comfortable with this plan and will follow up as needed.       Nicholos Johns, PA-C

## 2017-09-03 ENCOUNTER — Other Ambulatory Visit: Payer: Self-pay | Admitting: Cardiovascular Disease

## 2017-09-03 NOTE — Telephone Encounter (Signed)
Please review for refill, Thanks !  

## 2017-09-22 ENCOUNTER — Other Ambulatory Visit: Payer: Self-pay | Admitting: Physician Assistant

## 2017-10-01 ENCOUNTER — Telehealth: Payer: Self-pay | Admitting: Radiation Oncology

## 2017-10-01 NOTE — Telephone Encounter (Signed)
Received refill request for tamsulosin. Selected refill "not authorized." Also wrote in comment section that patient was released back to his urologist, Dr. Diona Fanti, on 08/14/2017. Encouraged patient/pharmacy to seek refill from Dr. Diona Fanti. Fax confirmation of fax delivery obtained.

## 2017-10-21 ENCOUNTER — Emergency Department: Payer: Medicare Other

## 2017-10-21 ENCOUNTER — Emergency Department
Admission: EM | Admit: 2017-10-21 | Discharge: 2017-10-21 | Disposition: A | Discharge status: Home / Self Care | Payer: Medicare Other | Attending: Emergency Medicine | Admitting: Emergency Medicine

## 2017-10-21 ENCOUNTER — Other Ambulatory Visit: Payer: Self-pay

## 2017-10-21 DIAGNOSIS — S51811A Laceration without foreign body of right forearm, initial encounter: Secondary | ICD-10-CM | POA: Insufficient documentation

## 2017-10-21 DIAGNOSIS — Y9389 Activity, other specified: Secondary | ICD-10-CM | POA: Insufficient documentation

## 2017-10-21 DIAGNOSIS — Y998 Other external cause status: Secondary | ICD-10-CM | POA: Insufficient documentation

## 2017-10-21 DIAGNOSIS — Z8546 Personal history of malignant neoplasm of prostate: Secondary | ICD-10-CM | POA: Insufficient documentation

## 2017-10-21 DIAGNOSIS — I5032 Chronic diastolic (congestive) heart failure: Secondary | ICD-10-CM | POA: Insufficient documentation

## 2017-10-21 DIAGNOSIS — Y929 Unspecified place or not applicable: Secondary | ICD-10-CM | POA: Insufficient documentation

## 2017-10-21 DIAGNOSIS — Z79899 Other long term (current) drug therapy: Secondary | ICD-10-CM | POA: Diagnosis not present

## 2017-10-21 DIAGNOSIS — Z87891 Personal history of nicotine dependence: Secondary | ICD-10-CM | POA: Insufficient documentation

## 2017-10-21 DIAGNOSIS — S61411A Laceration without foreign body of right hand, initial encounter: Secondary | ICD-10-CM | POA: Insufficient documentation

## 2017-10-21 DIAGNOSIS — W228XXA Striking against or struck by other objects, initial encounter: Secondary | ICD-10-CM | POA: Insufficient documentation

## 2017-10-21 DIAGNOSIS — Z23 Encounter for immunization: Secondary | ICD-10-CM | POA: Diagnosis not present

## 2017-10-21 DIAGNOSIS — I110 Hypertensive heart disease with heart failure: Secondary | ICD-10-CM | POA: Diagnosis not present

## 2017-10-21 DIAGNOSIS — Z7901 Long term (current) use of anticoagulants: Secondary | ICD-10-CM | POA: Diagnosis not present

## 2017-10-21 MED ORDER — TETANUS-DIPHTH-ACELL PERTUSSIS 5-2.5-18.5 LF-MCG/0.5 IM SUSP
0.5000 mL | Freq: Once | INTRAMUSCULAR | Status: AC
  Administered 2017-10-21: 0.5 mL via INTRAMUSCULAR
  Filled 2017-10-21: qty 0.5

## 2017-10-21 MED ORDER — LIDOCAINE HCL 1 % IJ SOLN
10.0000 mL | Freq: Once | INTRAMUSCULAR | Status: DC
  Filled 2017-10-21: qty 10

## 2017-10-21 MED ORDER — LIDOCAINE HCL (PF) 1 % IJ SOLN
INTRAMUSCULAR | Status: AC
  Filled 2017-10-21: qty 5

## 2017-10-21 MED ORDER — SULFAMETHOXAZOLE-TRIMETHOPRIM 800-160 MG PO TABS: 1.0000 | ORAL_TABLET | Freq: Two times a day (BID) | ORAL | 0 refills | Status: AC

## 2017-10-21 NOTE — ED Triage Notes (Signed)
Pt with multiple small lacerations to right hand from a tree branch. Pt with skin tear x2 noted to right forearm. Bleeding controlled. Pt states happened at 1830 today.

## 2017-10-21 NOTE — ED Provider Notes (Signed)
[facility] Emergency Department Provider Note  ____________________________________________  Time seen: Approximately 10:47 PM  I have reviewed the triage vital signs and the nursing notes.   HISTORY  Chief Complaint Laceration    HPI Zachary Green is a 82 y.o. male presents to the emergency department with 2 lacerations along the radial aspect of the right hand after patient was trying to clean up a falling tree limb.  Patient also sustained two skin tears along the dorsal aspect of the right forearm.  Patient did not hit his head or lose consciousness.  His tetanus status is out of date.  No alleviating measures have been attempted.   Past Medical History:  Diagnosis Date  . Arthritis   . BPH (benign prostatic hypertrophy)   . Chronic diastolic CHF (congestive heart failure) ([place])    a. echo 12/17: 55-60%, no RWMA, trivial AI, moderate MR with systolic bowing without prolapse, moderate TR, mild biatrial enlargement, PASP 50 mmHg  . Diverticulitis   . ED (erectile dysfunction)   . GERD (gastroesophageal reflux disease)   . Hyperlipidemia   . Hypertension   . Impaired fasting glucose   . Persistent atrial fibrillation (HCC)    a. on Eliquis, amiodarone, Toprol; b. s/p successful dccv 03/28/16; c. CHADS2VASc at least 3 (HTN, age x 2)  . Personal history of colonic adenoma 05/23/2007   04/2007 - 6 mm adenoma  . Prostate cancer ([place])   . Rosacea   . Schatzki's ring    grade 1 varices    Patient Active Problem List   Diagnosis Date Noted  . Malignant neoplasm of prostate ([place]) 04/05/2017  . Chronic diastolic CHF (congestive heart failure) ([place])   . Persistent atrial fibrillation ([place])   . Routine general medical examination at a health care facility 11/22/2010  . UNSPEC POLYARTHROPATHY/POLYARTHRITIS SITE UNSPEC 05/17/2010  . OSTEOARTHRITIS 10/29/2007  . IMPAIRED FASTING GLUCOSE 10/29/2007  . Personal history of colonic adenoma 05/23/2007  . BUNION,  LEFT FOOT 11/22/2006  . SCHATZKI'S RING 11/16/2006  . HYPERLIPIDEMIA 11/12/2006  . ERECTILE DYSFUNCTION 11/12/2006  . Essential hypertension 11/12/2006  . DIVERTICULOSIS, COLON 11/12/2006  . BENIGN PROSTATIC HYPERTROPHY 11/12/2006    Past Surgical History:  Procedure Laterality Date  . COLONOSCOPY    . ELECTROPHYSIOLOGIC STUDY N/A 03/28/2016   Procedure: CARDIOVERSION;  Surgeon: Minna Merritts, MD;  Location: [facility] ORS;  Service: Cardiovascular;  Laterality: N/A;  . ESOPHAGOGASTRODUODENOSCOPY    . ESOPHAGOGASTRODUODENOSCOPY    . ESOPHAGOGASTRODUODENOSCOPY (EGD) WITH PROPOFOL N/A 08/18/2016   Procedure: ESOPHAGOGASTRODUODENOSCOPY (EGD) WITH PROPOFOL;  Surgeon: Lollie Sails, MD;  Location: [facility] ENDOSCOPY;  Service: Endoscopy;  Laterality: N/A;  . GREEN LIGHT LASER TURP (TRANSURETHRAL RESECTION OF PROSTATE N/A 08/17/2015   Procedure: GREEN LIGHT LASER TURP (TRANSURETHRAL RESECTION OF PROSTATE;  Surgeon: Royston Cowper, MD;  Location: [facility] ORS;  Service: Urology;  Laterality: N/A;  . NM MYOVIEW LTD    . PATELLA FRACTURE SURGERY     pin placed 1984  . REFRACTIVE SURGERY    . UPPER ESOPHAGEAL ENDOSCOPIC ULTRASOUND (EUS) N/A 09/21/2016   Procedure: UPPER ESOPHAGEAL ENDOSCOPIC ULTRASOUND (EUS);  Surgeon: Reita Cliche, MD;  Location: [facility] ENDOSCOPY;  Service: Gastroenterology;  Laterality: N/A;    Prior to Admission medications   Medication Sig Start Date End Date Taking? Authorizing Provider  amiodarone (PACERONE) 100 MG tablet TAKE 1 TABLET (100 MG TOTAL) BY MOUTH DAILY. 04/19/17   Rise Mu, PA-C  docusate sodium (COLACE) 100 MG capsule Take  2 capsules (200 mg total) by mouth 2 (two) times daily. Patient taking differently: Take 100 mg by mouth daily.  08/17/15   Royston Cowper, MD  doxycycline (ADOXA) 50 MG tablet Take 50 mg by mouth daily.    [provider]  ELIQUIS 5 MG TABS tablet TAKE 1 TABLET BY MOUTH TWICE A DAY 09/03/17   Wellington Hampshire, MD  esomeprazole  (NEXIUM) 20 MG capsule Take 20 mg by mouth daily at 12 noon.     [provider]  furosemide (LASIX) 20 MG tablet TAKE 1 TABLET (20 MG TOTAL) BY MOUTH DAILY. 09/24/17   Dunn, Areta Haber, PA-C  losartan (COZAAR) 50 MG tablet Take 50 mg by mouth every morning.    [provider]  metoprolol succinate (TOPROL-XL) 25 MG 24 hr tablet Take 0.5 tablets (12.5 mg total) by mouth daily. 11/20/16   Wellington Hampshire, MD  metroNIDAZOLE (METROGEL) 1 % gel APP 1 GRAM TOPICALLY ON THE SKIN HS 03/16/16   [provider]  Multiple Vitamin (MULTI-VITAMINS) TABS Take 1 tablet by mouth daily.     [provider]  Omega-3 Fatty Acids (FISH OIL PO) Take 1 capsule by mouth 2 (two) times daily.    [provider]  permethrin (ELIMITE) 5 % cream Apply 1 application topically 2 (two) times daily.     [provider]  Polyethyl Glycol-Propyl Glycol (SYSTANE OP) Place 1 drop into both eyes daily as needed (dry eyes).    [provider]  pravastatin (PRAVACHOL) 40 MG tablet TAKE ONE TABLET BY MOUTH EVERY DAY Patient taking differently: TAKE ONE TABLET BY MOUTH EVERY EVENING 10/08/11   Venia Carbon, MD  sulfamethoxazole-trimethoprim (BACTRIM DS,SEPTRA DS) 800-160 MG tablet Take 1 tablet by mouth 2 (two) times daily for 7 days. 10/21/17 10/28/17  Lannie Fields, PA-C  tamsulosin (FLOMAX) 0.4 MG CAPS capsule Take 1 capsule (0.4 mg total) by mouth daily after supper. 06/15/17   Bruning, Ashlyn, PA-C    Allergies Codeine  Family History  Problem Relation Age of Onset  . Alzheimer's disease Mother   . Cancer Mother        Melanoma  . Diabetes Brother     Social History Social History   Tobacco Use  . Smoking status: Former Smoker    Packs/day: 1.00    Years: 7.00    Pack years: 7.00    Types: Cigarettes    Last attempt to quit: 03/28/1959    Years since quitting: 58.6  . Smokeless tobacco: Never Used  Substance Use Topics  . Alcohol use: No  . Drug use: No      Review of Systems  Constitutional: No fever/chills Eyes: No visual changes. No discharge ENT: No upper respiratory complaints. Cardiovascular: no chest pain. Respiratory: no cough. No SOB. Gastrointestinal: No abdominal pain.  No nausea, no vomiting.  No diarrhea.  No constipation. Genitourinary: Negative for dysuria. No hematuria Musculoskeletal: Negative for musculoskeletal pain. Skin: Patient has lacerations and skin tears.  Neurological: Negative for headaches, focal weakness or numbness.  ____________________________________________   PHYSICAL EXAM:  VITAL SIGNS: ED Triage Vitals [10/21/17 1947]  Enc Vitals Group     BP (!) 161/67     Pulse Rate (!) 59     Resp 16     Temp 98.1 F (36.7 C)     Temp Source Oral     SpO2 100 %     Weight 194 lb (88 kg)  Height 5\' 7"  (1.702 m)     Head Circumference      Peak Flow      Pain Score 0     Pain Loc      Pain Edu?      Excl. in [place]?      Constitutional: Alert and oriented. Well appearing and in no acute distress. Eyes: Conjunctivae are normal. PERRL. EOMI. Head: Atraumatic. Cardiovascular: Normal rate, regular rhythm. Normal S1 and S2.  Good peripheral circulation. Respiratory: Normal respiratory effort without tachypnea or retractions. Lungs CTAB. Good air entry to the bases with no decreased or absent breath sounds. Musculoskeletal: Full range of motion to all extremities. No gross deformities appreciated. Neurologic:  Normal speech and language. No gross focal neurologic deficits are appreciated.  Skin: Patient has two 2 cm lacerations along the dorsal aspect of right hand.  Patient has 2 0.5 cm skin tears along right forearm. Psychiatric: Mood and affect are normal. Speech and behavior are normal. Patient exhibits appropriate insight and judgement.   ____________________________________________   LABS (all labs ordered are listed, but only abnormal results are displayed)  Labs Reviewed - No data to  display ____________________________________________  EKG   ____________________________________________  RADIOLOGY I personally viewed and evaluated these images as part of my medical decision making, as well as reviewing the written report by the radiologist.  Dg Hand Complete Right  Result Date: 10/21/2017 CLINICAL DATA:  Multiple lacerations from tree branch. The fourth and fifth digits are affected. EXAM: RIGHT HAND - COMPLETE 3+ VIEW COMPARISON:  None. FINDINGS: No foreign bodies identified. No fractures. Significant degenerative changes at the base of the first metacarpal. Vascular calcifications noted. IMPRESSION: No acute abnormalities. Electronically Signed   By: Dorise Bullion III M.D   On: 10/21/2017 21:02    ____________________________________________    PROCEDURES  Procedure(s) performed:    Procedures  LACERATION REPAIR Performed by: Lannie Fields Authorized by: Lannie Fields Consent: Verbal consent obtained. Risks and benefits: risks, benefits and alternatives were discussed Consent given by: patient Patient identity confirmed: provided demographic data Prepped and Draped in normal sterile fashion Wound explored  Laceration Location: Right hand   Laceration Length: 2 cm  No Foreign Bodies seen or palpated  Anesthesia: local infiltration  Local anesthetic: lidocaine 1% without  epinephrine  Anesthetic total: 3 ml  Irrigation method: syringe Amount of cleaning: standard  Skin closure: 5-0 Ethilon   Number of sutures: 7  Technique: Simple Interrupted   Patient tolerance: Patient tolerated the procedure well with no immediate complications.  LACERATION REPAIR Performed by: Lannie Fields Authorized by: Lannie Fields Consent: Verbal consent obtained. Risks and benefits: risks, benefits and alternatives were discussed Consent given by: patient Patient identity confirmed: provided demographic data Prepped and Draped in normal sterile  fashion Wound explored  Laceration Location: Right hand   Laceration Length: 2 cm  No Foreign Bodies seen or palpated  Anesthesia: local infiltration  Local anesthetic: lidocaine 1% without epinephrine  Anesthetic total: 3 ml  Irrigation method: syringe Amount of cleaning: standard  Skin closure: 5-0 Ethilon   Number of sutures: 9  Technique: Simple Interrupted   Patient tolerance: Patient tolerated the procedure well with no immediate complications.  LACERATION REPAIR Performed by: Lannie Fields Authorized by: Lannie Fields Consent: Verbal consent obtained. Risks and benefits: risks, benefits and alternatives were discussed Consent given by: patient Patient identity confirmed: provided demographic data Prepped and Draped in normal sterile fashion Wound explored  Laceration  Location: Right forearm  Laceration Length: 0.5 cm  No Foreign Bodies seen or palpated  Irrigation method: syringe Amount of cleaning: standard  Skin closure: Dermabond   Patient tolerance: Patient tolerated the procedure well with no immediate complications.  LACERATION REPAIR Performed by: Lannie Fields Authorized by: Lannie Fields Consent: Verbal consent obtained. Risks and benefits: risks, benefits and alternatives were discussed Consent given by: patient Patient identity confirmed: provided demographic data Prepped and Draped in normal sterile fashion Wound explored  Laceration Location: Right forearm   Laceration Length: 0.5 cm  No Foreign Bodies seen or palpated  Skin closure: Dermabond   Patient tolerance: Patient tolerated the procedure well with no immediate complications.    Medications  lidocaine (XYLOCAINE) 1 % (with pres) injection 10 mL (has no administration in time range)  lidocaine (PF) (XYLOCAINE) 1 % injection (has no administration in time range)  Tdap (BOOSTRIX) injection 0.5 mL (0.5 mLs Intramuscular Given 10/21/17 2216)      ____________________________________________   INITIAL IMPRESSION / ASSESSMENT AND PLAN / ED COURSE  Pertinent labs & imaging results that were available during my care of the patient were reviewed by me and considered in my medical decision making (see chart for details).  Review of the [place] CSRS was performed in accordance of the [place] prior to dispensing any controlled drugs.     Assessment and plan Lacerations Patient presents to the emergency department with 2 right hand lacerations and 2 skin tears.  Patient underwent laceration repair and skin tear repair without complication.  He was advised to have sutures removed by primary care in 1 week.  He was treated empirically with Bactrim.  X-ray examination of the right hand revealed no retained foreign bodies.  Patient's tetanus status was updated in the emergency department.  All patient questions were answered.    ____________________________________________  FINAL CLINICAL IMPRESSION(S) / ED DIAGNOSES  Final diagnoses:  Laceration of right hand without foreign body, initial encounter      NEW MEDICATIONS STARTED DURING THIS VISIT:  ED Discharge Orders        Ordered    sulfamethoxazole-trimethoprim (BACTRIM DS,SEPTRA DS) 800-160 MG tablet  2 times daily     10/21/17 2211          This chart was dictated using voice recognition software/Dragon. Despite best efforts to proofread, errors can occur which can change the meaning. Any change was purely unintentional.    Karren Cobble 10/21/17 2300    Carrie Mew, MD 10/22/17 [phone]

## 2017-11-02 ENCOUNTER — Telehealth: Payer: Self-pay | Admitting: Radiation Oncology

## 2017-11-02 NOTE — Telephone Encounter (Signed)
Received refill request for tamsulosin. Selected refill "not authorized." Also wrote in comment section that patient was released back to his urologist, Dr. Diona Fanti, on 08/14/2017. Encouraged patient/pharmacy to seek refill from Dr. Diona Fanti. Fax confirmation of fax delivery obtained.

## 2017-11-28 ENCOUNTER — Telehealth: Payer: Self-pay | Admitting: Radiation Oncology

## 2017-11-28 NOTE — Progress Notes (Signed)
Received refill request for tamsulosin. Selected refill "not authorized." Also wrote in comment section that patient was released back to his urologist, Dr. Diona Fanti, on 08/14/2017. Encouraged patient/pharmacy to seek refill from Dr. Diona Fanti. Fax confirmation of fax delivery obtained.

## 2017-11-29 ENCOUNTER — Telehealth: Payer: Self-pay | Admitting: Radiation Oncology

## 2017-11-29 NOTE — Progress Notes (Signed)
Faxed signed refill authorization for Tamsulosin to [facility] pharmacy at [phone]. Fax confirmation of delivery obtained.

## 2017-12-16 ENCOUNTER — Other Ambulatory Visit: Payer: Self-pay | Admitting: Cardiovascular Disease

## 2017-12-18 ENCOUNTER — Ambulatory Visit (INDEPENDENT_AMBULATORY_CARE_PROVIDER_SITE_OTHER): Payer: Medicare Other | Admitting: Cardiovascular Disease

## 2017-12-18 ENCOUNTER — Encounter: Payer: Self-pay | Admitting: Cardiovascular Disease

## 2017-12-18 VITALS — BP 150/66 | HR 54 | Ht 67.0 in | Wt 201.0 lb

## 2017-12-18 DIAGNOSIS — I4819 Other persistent atrial fibrillation: Secondary | ICD-10-CM

## 2017-12-18 DIAGNOSIS — I10 Essential (primary) hypertension: Secondary | ICD-10-CM | POA: Diagnosis not present

## 2017-12-18 DIAGNOSIS — I5032 Chronic diastolic (congestive) heart failure: Secondary | ICD-10-CM | POA: Diagnosis not present

## 2017-12-18 DIAGNOSIS — I481 Persistent atrial fibrillation: Secondary | ICD-10-CM | POA: Diagnosis not present

## 2017-12-18 DIAGNOSIS — E785 Hyperlipidemia, unspecified: Secondary | ICD-10-CM | POA: Diagnosis not present

## 2017-12-18 MED ORDER — LOSARTAN POTASSIUM 100 MG PO TABS: 100.0000 mg | ORAL_TABLET | ORAL | 2 refills | Status: DC

## 2017-12-18 NOTE — Progress Notes (Signed)
Cardiology Office Note   Date:  12/18/2017   ID:  Zachary Green, DOB 07/10/1935, MRN [id number]  PCP:  [name], MD  Cardiologist:   [name], MD   Chief Complaint  Patient presents with  . other    9 mo follow up.Medications verbally reviewed. "Doing Well"      History of Present Illness: Zachary Green is a 82 y.o. male who presents for a follow-up visit regarding persistent atrial fibrillation maintained in sinus rhythm on amiodarone. He has history of BPH and prostate cancer treated with radiation therapy last summer with recurrent urinary tract infection. He underwent successful cardioversion in January after loading him with amiodarone. He has chronic medical conditions that include hypertension and hyperlipidemia. He is not diabetic. He smoked for 5 years in his 3s. Family history is remarkable for atrial fibrillation but no premature coronary artery disease. Nuclear stress test in 2017 was normal. Echocardiogram in 2017 showed normal ejection fraction, moderate mitral and tricuspid regurgitation and moderate pulmonary hypertension. He was placed on small dose furosemide.  He has been doing well with no recent chest pain, shortness of breath or palpitations.  He has mild bradycardia on small dose amiodarone and Toprol but overall is asymptomatic.  Blood pressure has been running high lately.  No bleeding complications with Eliquis.  Past Medical History:  Diagnosis Date  . Arthritis   . BPH (benign prostatic hypertrophy)   . Chronic diastolic CHF (congestive heart failure) ([place])    a. echo 12/17: 55-60%, no RWMA, trivial AI, moderate MR with systolic bowing without prolapse, moderate TR, mild biatrial enlargement, PASP 50 mmHg  . Diverticulitis   . ED (erectile dysfunction)   . GERD (gastroesophageal reflux disease)   . Hyperlipidemia   . Hypertension   . Impaired fasting glucose   . Persistent atrial fibrillation (HCC)    a. on Eliquis,  amiodarone, Toprol; b. s/p successful dccv 03/28/16; c. CHADS2VASc at least 3 (HTN, age x 2)  . Personal history of colonic adenoma 05/23/2007   04/2007 - 6 mm adenoma  . Prostate cancer ([place])   . Rosacea   . Schatzki's ring    grade 1 varices    Past Surgical History:  Procedure Laterality Date  . COLONOSCOPY    . ELECTROPHYSIOLOGIC STUDY N/A 03/28/2016   Procedure: CARDIOVERSION;  Surgeon: Minna Merritts, MD;  Location: [facility] ORS;  Service: Cardiovascular;  Laterality: N/A;  . ESOPHAGOGASTRODUODENOSCOPY    . ESOPHAGOGASTRODUODENOSCOPY    . ESOPHAGOGASTRODUODENOSCOPY (EGD) WITH PROPOFOL N/A 08/18/2016   Procedure: ESOPHAGOGASTRODUODENOSCOPY (EGD) WITH PROPOFOL;  Surgeon: Lollie Sails, MD;  Location: [facility] ENDOSCOPY;  Service: Endoscopy;  Laterality: N/A;  . GREEN LIGHT LASER TURP (TRANSURETHRAL RESECTION OF PROSTATE N/A 08/17/2015   Procedure: GREEN LIGHT LASER TURP (TRANSURETHRAL RESECTION OF PROSTATE;  Surgeon: Royston Cowper, MD;  Location: [facility] ORS;  Service: Urology;  Laterality: N/A;  . NM MYOVIEW LTD    . PATELLA FRACTURE SURGERY     pin placed 1984  . REFRACTIVE SURGERY    . UPPER ESOPHAGEAL ENDOSCOPIC ULTRASOUND (EUS) N/A 09/21/2016   Procedure: UPPER ESOPHAGEAL ENDOSCOPIC ULTRASOUND (EUS);  Surgeon: Reita Cliche, MD;  Location[facility] ENDOSCOPY;  Service: Gastroenterology;  Laterality: N/A;     Current Outpatient Medications  Medication Sig Dispense Refill  . amiodarone (PACERONE) 100 MG tablet TAKE 1 TABLET (100 MG TOTAL) BY MOUTH DAILY. 90 tablet 3  . docusate sodium (COLACE) 100 MG capsule Take 2 capsules (200 mg  total) by mouth 2 (two) times daily. (Patient taking differently: Take 100 mg by mouth daily. ) 120 capsule 3  . doxycycline (ADOXA) 50 MG tablet Take 50 mg by mouth daily.    Marland Kitchen ELIQUIS 5 MG TABS tablet TAKE 1 TABLET BY MOUTH TWICE A DAY 60 tablet 3  . esomeprazole (NEXIUM) 20 MG capsule Take 20 mg by mouth daily at 12 noon.     . furosemide (LASIX) 20 MG  tablet TAKE 1 TABLET (20 MG TOTAL) BY MOUTH DAILY. 90 tablet 0  . losartan (COZAAR) 50 MG tablet Take 50 mg by mouth every morning.    . metoprolol succinate (TOPROL-XL) 25 MG 24 hr tablet TAKE 0.5 TABLETS (12.5 MG TOTAL) BY MOUTH DAILY. 30 tablet 1  . metroNIDAZOLE (METROGEL) 1 % gel APP 1 GRAM TOPICALLY ON THE SKIN HS    . Multiple Vitamin (MULTI-VITAMINS) TABS Take 1 tablet by mouth daily.     . Omega-3 Fatty Acids (FISH OIL PO) Take 1 capsule by mouth 2 (two) times daily.    . permethrin (ELIMITE) 5 % cream Apply 1 application topically 2 (two) times daily.     [name] Glycol-Propyl Glycol (SYSTANE OP) Place 1 drop into both eyes daily as needed (dry eyes).    . pravastatin (PRAVACHOL) 40 MG tablet TAKE ONE TABLET BY MOUTH EVERY DAY (Patient taking differently: TAKE ONE TABLET BY MOUTH EVERY EVENING) 90 tablet 3  . tamsulosin (FLOMAX) 0.4 MG CAPS capsule Take 1 capsule (0.4 mg total) by mouth daily after supper. 30 capsule 5   No current facility-administered medications for this visit.     Allergies:   Codeine    Social History:  The patient  reports that he quit smoking about 58 years ago. His smoking use included cigarettes. He has a 7.00 pack-year smoking history. He has never used smokeless tobacco. He reports that he does not drink alcohol or use drugs.   Family History:  The patient's family history includes Alzheimer's disease in his mother; Cancer in his mother; Diabetes in his brother.    ROS:  Please see the history of present illness.   Otherwise, review of systems are positive for none.   All other systems are reviewed and negative.    PHYSICAL EXAM: VS:  BP (!) 150/66 (BP Location: Left Arm, Patient Position: Sitting, Cuff Size: Normal)   Pulse (!) 54   Ht 5\' 7"  (1.702 m)   Wt 201 lb (91.2 kg)   BMI 31.48 kg/m  , BMI Body mass index is 31.48 kg/m. GEN: Well nourished, well developed, in no acute distress  HEENT: normal  Neck: no JVD, carotid bruits, or  masses Cardiac: RRR; no murmurs, rubs, or gallops,no edema  Respiratory:  clear to auscultation bilaterally, normal work of breathing GI: soft, nontender, nondistended, + BS MS: no deformity or atrophy  Skin: warm and dry, no rash Neuro:  Strength and sensation are intact Psych: euthymic mood, full affect   EKG:  EKG is ordered today. The ekg ordered today demonstrates sinus bradycardia with heart rate of 54 bpm.   Recent Labs: No results found for requested labs within last 8760 hours.    Lipid Panel    Component Value Date/Time   CHOL 181 11/22/2010 1012   TRIG 82.0 11/22/2010 1012   HDL 54.40 11/22/2010 1012   CHOLHDL 3 11/22/2010 1012   VLDL 16.4 11/22/2010 1012   LDLCALC 110 (H) 11/22/2010 1012   LDLDIRECT 126.2 05/01/2007 0954  Wt Readings from Last 3 Encounters:  12/18/17 201 lb (91.2 kg)  10/21/17 194 lb (88 kg)  08/14/17 201 lb 3.2 oz (91.3 kg)       PAD Screen 03/09/2016  Previous PAD dx? No  Previous surgical procedure? No  Pain with walking? No  Feet/toe relief with dangling? No  Painful, non-healing ulcers? No  Extremities discolored? No      ASSESSMENT AND PLAN:  1.  Persistent atrial fibrillation: He continues to maintain in sinus rhythm with small dose amiodarone 100 mg once daily.   He is tolerating anticoagulation with Eliquis with no bleeding complications.  I reviewed most recent labs done in July which showed normal kidney function and normal liver profile. Given that he is on amiodarone, I recommend checking thyroid function with his next labs.  2. Essential hypertension: Blood pressure is elevated.  I increased losartan to 100 mg daily.  3. Aortic atherosclerosis: The patient has normal pulses and no evidence of obstructive peripheral arterial disease.  4. Hyperlipidemia: Currently on pravastatin.  Most recent lipid profile in July showed an LDL of 109.  5. Pulmonary nodules: Followed by primary care physician.  6. Chronic  diastolic heart failure with pulmonary hypertension: He appears to be euvolemic small dose furosemide.   Disposition:   FU with me in 9 months  Signed,  [name], MD  12/18/2017 3:10 PM    [place]

## 2017-12-18 NOTE — Patient Instructions (Signed)
Medication Instructions: INCREASE the Losartan to 100 mg daily  If you need a refill on your cardiac medications before your next appointment, please call your pharmacy.   Follow-Up: Your physician wants you to follow-up in 9 months with Dr. Fletcher Anon. You will receive a reminder letter in the mail two months in advance. If you don't receive a letter, please call our office at [phone] to schedule this follow-up appointment.   Thank you for choosing [facility] at [facility]!

## 2017-12-31 ENCOUNTER — Other Ambulatory Visit: Payer: Self-pay | Admitting: Cardiovascular Disease

## 2017-12-31 NOTE — Telephone Encounter (Signed)
Refill Request.  

## 2018-02-07 IMAGING — CT CT ABD-PELV W/ CM
2 of 5 series · 16 of 46 positions shown, 18 images · IV contrast (APPLIED)
Comparison: None.

CLINICAL DATA: Lower abdomen pain, dizziness, new onset atrial
fibrillation

EXAM:
CT ABDOMEN AND PELVIS WITH CONTRAST
TECHNIQUE: Multidetector CT imaging of the abdomen and pelvis was performed
using the standard protocol following bolus administration of
intravenous contrast.
CONTRAST:  100mL [id number] IOPAMIDOL ([id number]) INJECTION 61%

[Series 2: axial st · axial · 0.81mm/px · z∈[-522,-107]mm · 13 of 93 slices shown, 15 images]
[im 5/93  soft-tissue]
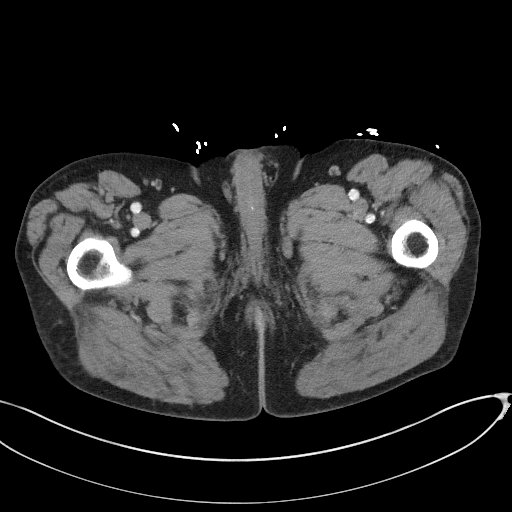
[im 5/93  bone]
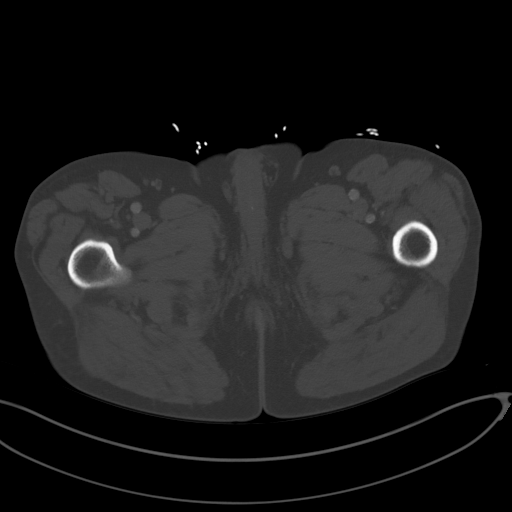
[im 14/93  soft-tissue]
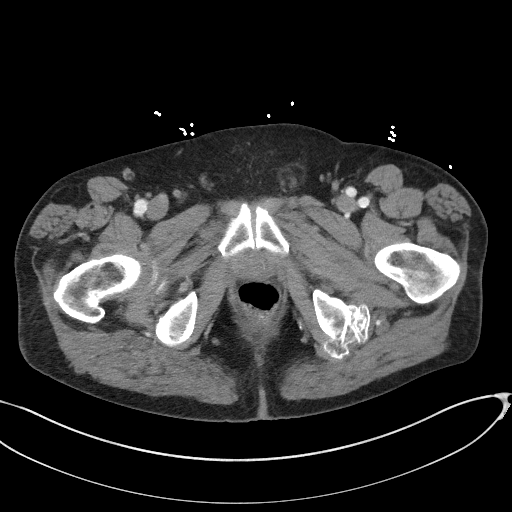
[im 19/93  soft-tissue]
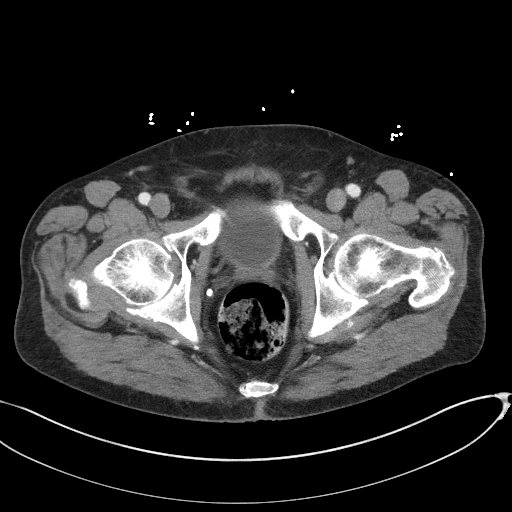
[im 28/93  soft-tissue]
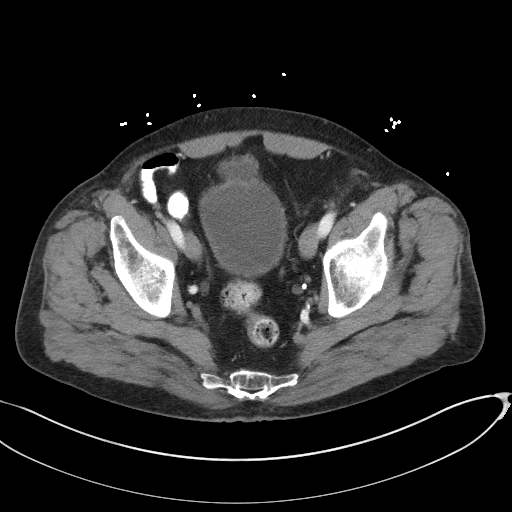
[im 33/93  soft-tissue]
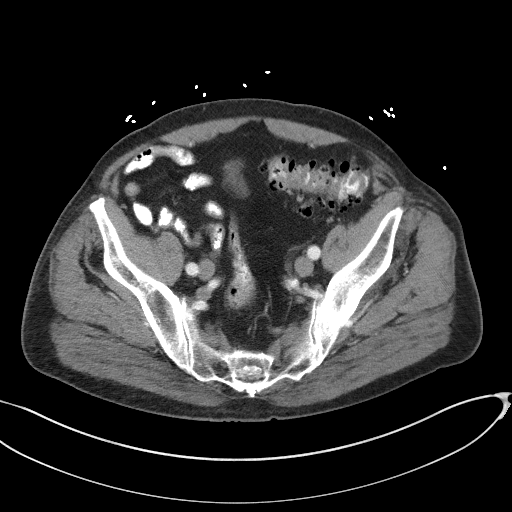
[im 42/93  soft-tissue]
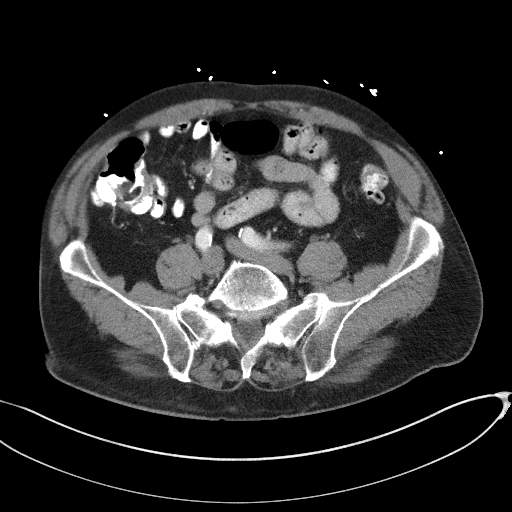
[im 47/93  soft-tissue]
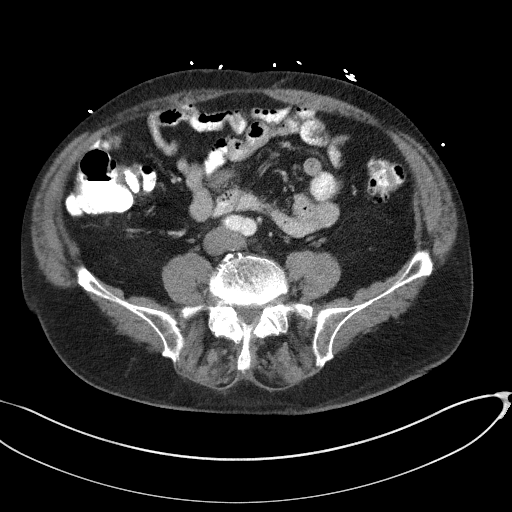
[im 51/93  soft-tissue]
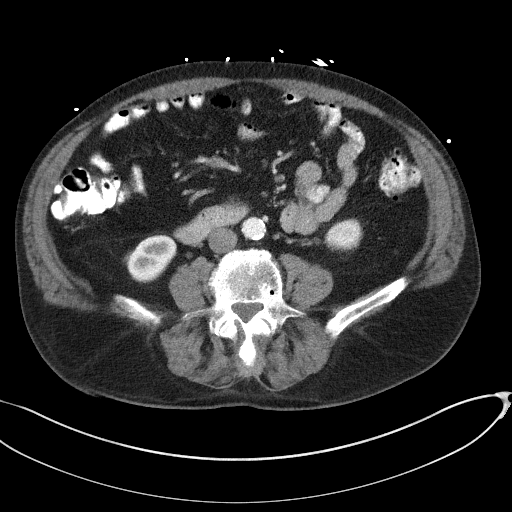
[im 60/93  soft-tissue]
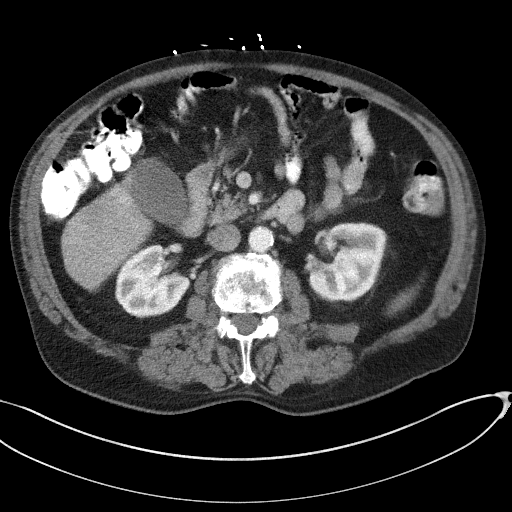
[im 60/93  bone]
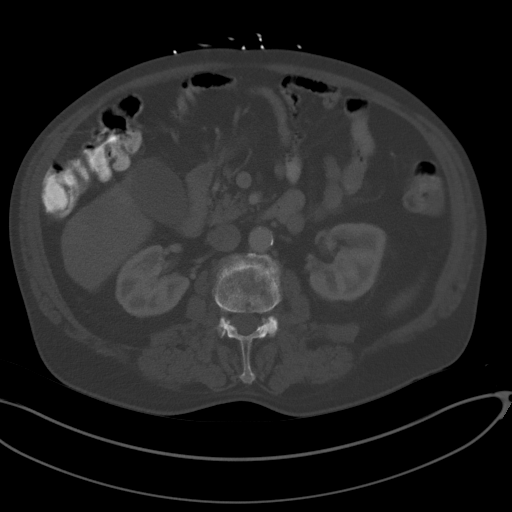
[im 65/93  soft-tissue]
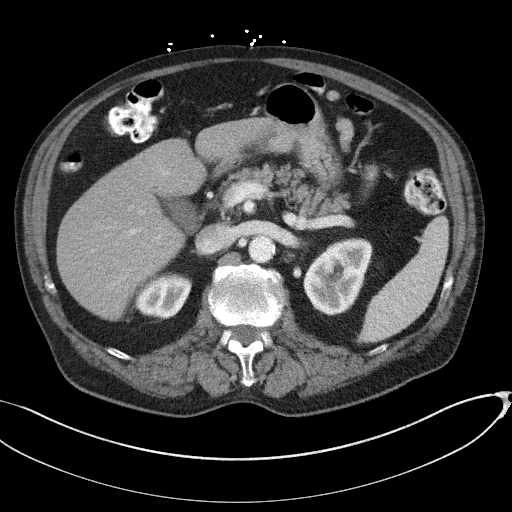
[im 74/93  soft-tissue]
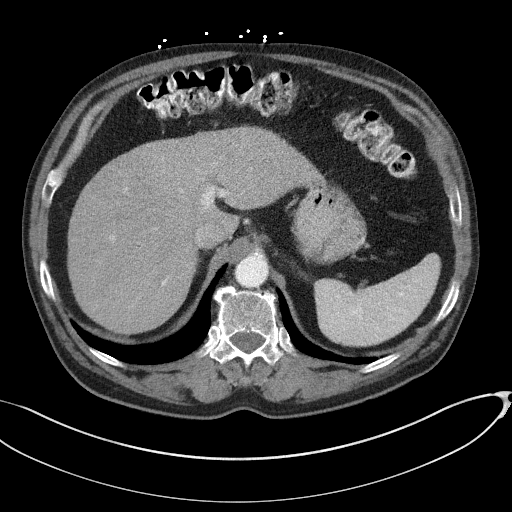
[im 79/93  soft-tissue]
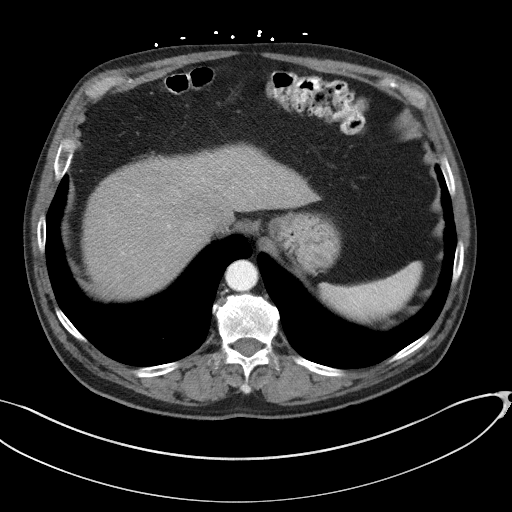
[im 88/93  soft-tissue]
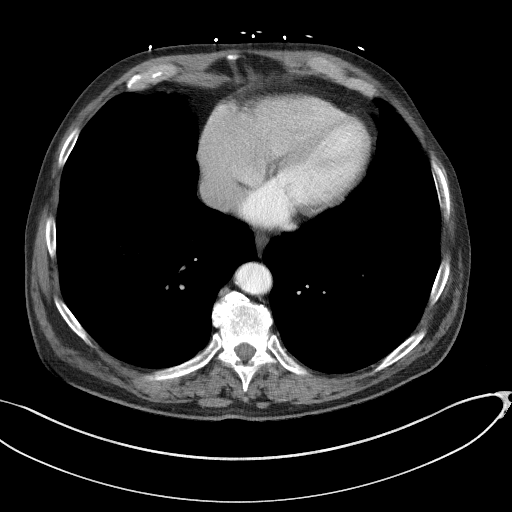

[Series 5: coronal st · coronal · 0.74mm/px · 3 of 101 slices shown]
[im 34/101  soft-tissue]
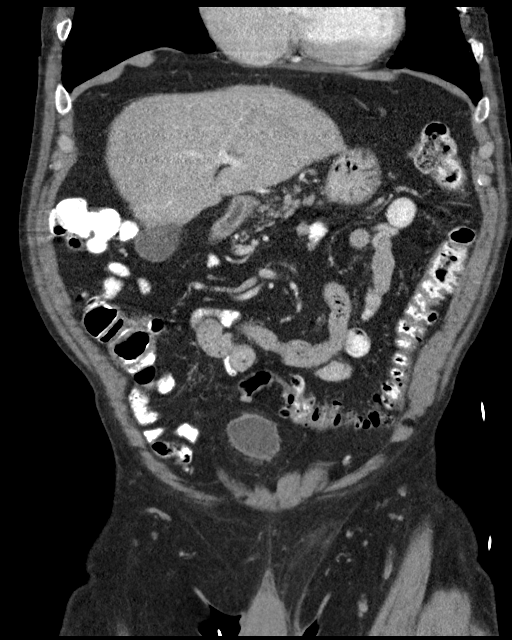
[im 45/101  soft-tissue]
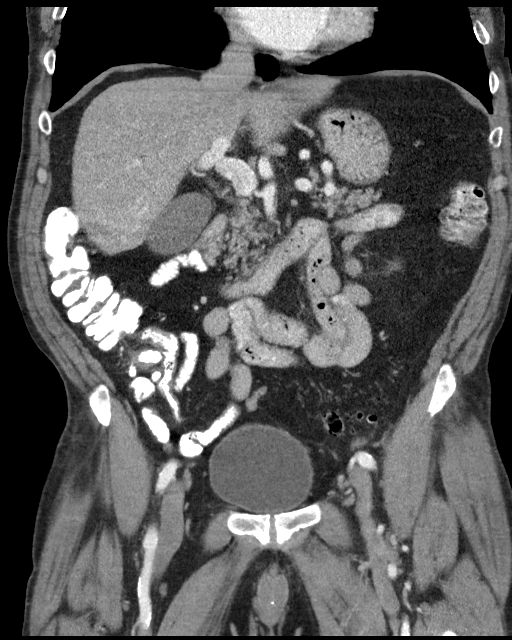
[im 56/101  soft-tissue]
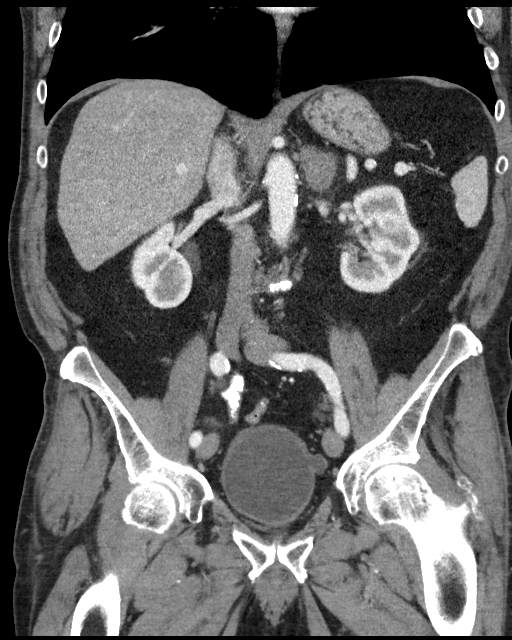

[16 of 46 positions shown; findings below may reference images not displayed]

FINDINGS: Lower chest: There are several noncalcified nodules in the right
lower lobe the largest measuring 8 mm in diameter. No definite
nodule is seen within the left lower lobe. The heart is within
normal limits in size.

Hepatobiliary: The liver enhances with no focal abnormality in the
liver is somewhat small in size. No calcified gallstones are noted.

Pancreas: The pancreas is unremarkable and the pancreatic duct is
not dilated.

Spleen: The spleen is within normal limits in size.

Adrenals/Urinary Tract: The adrenal glands are symmetrical with no
abnormality noted. The kidneys enhance with no calculus or mass and
on delayed images, the pelvocaliceal systems are unremarkable. The
ureters are normal in caliber. The urinary bladder is not optimally
distended. There are at least 2 urinary bladder diverticula present
the larger being anteriorly and superiorly positions.

Stomach/Bowel: The stomach is largely decompressed. No small bowel
dilatation is seen. The rectosigmoid colon is somewhat elongated and
there are multiple rectosigmoid colon diverticula. No diverticulitis
is seen. The terminal ileum and the appendix are unremarkable.

Vascular/Lymphatic: The abdominal aorta is normal in caliber with
moderate abdominal aortic atherosclerosis present. No adenopathy is
seen.

Reproductive: The prostate is normal in size for age.

Other: None

Musculoskeletal: Hypertrophic bone formation around the posterior
left ischial ramus probably represents previous trauma with healing.
The lumbar vertebrae are normal alignment with degenerative disc
disease at L2-3 and L4-5 levels.
IMPRESSION: 1. Several lung nodules at the right lung base. Cannot exclude
metastatic involvement. Consider CT of the chest.
2. At least 2 urinary bladder diverticula.
3. Multiple rectosigmoid colon diverticula.  No diverticulitis.
4. Moderate abdominal aortic atherosclerosis.

## 2018-02-21 IMAGING — DX DG CHEST 1V PORT
1 series · 1 of 1 positions shown · non-contrast
Comparison: Portable exam 9618 hours compared to 03/07/2016

CLINICAL DATA: Generalized weakness, urinary problems, atrial
fibrillation, hypertension, prostate cancer

EXAM:
PORTABLE CHEST 1 VIEW

[chest ap]
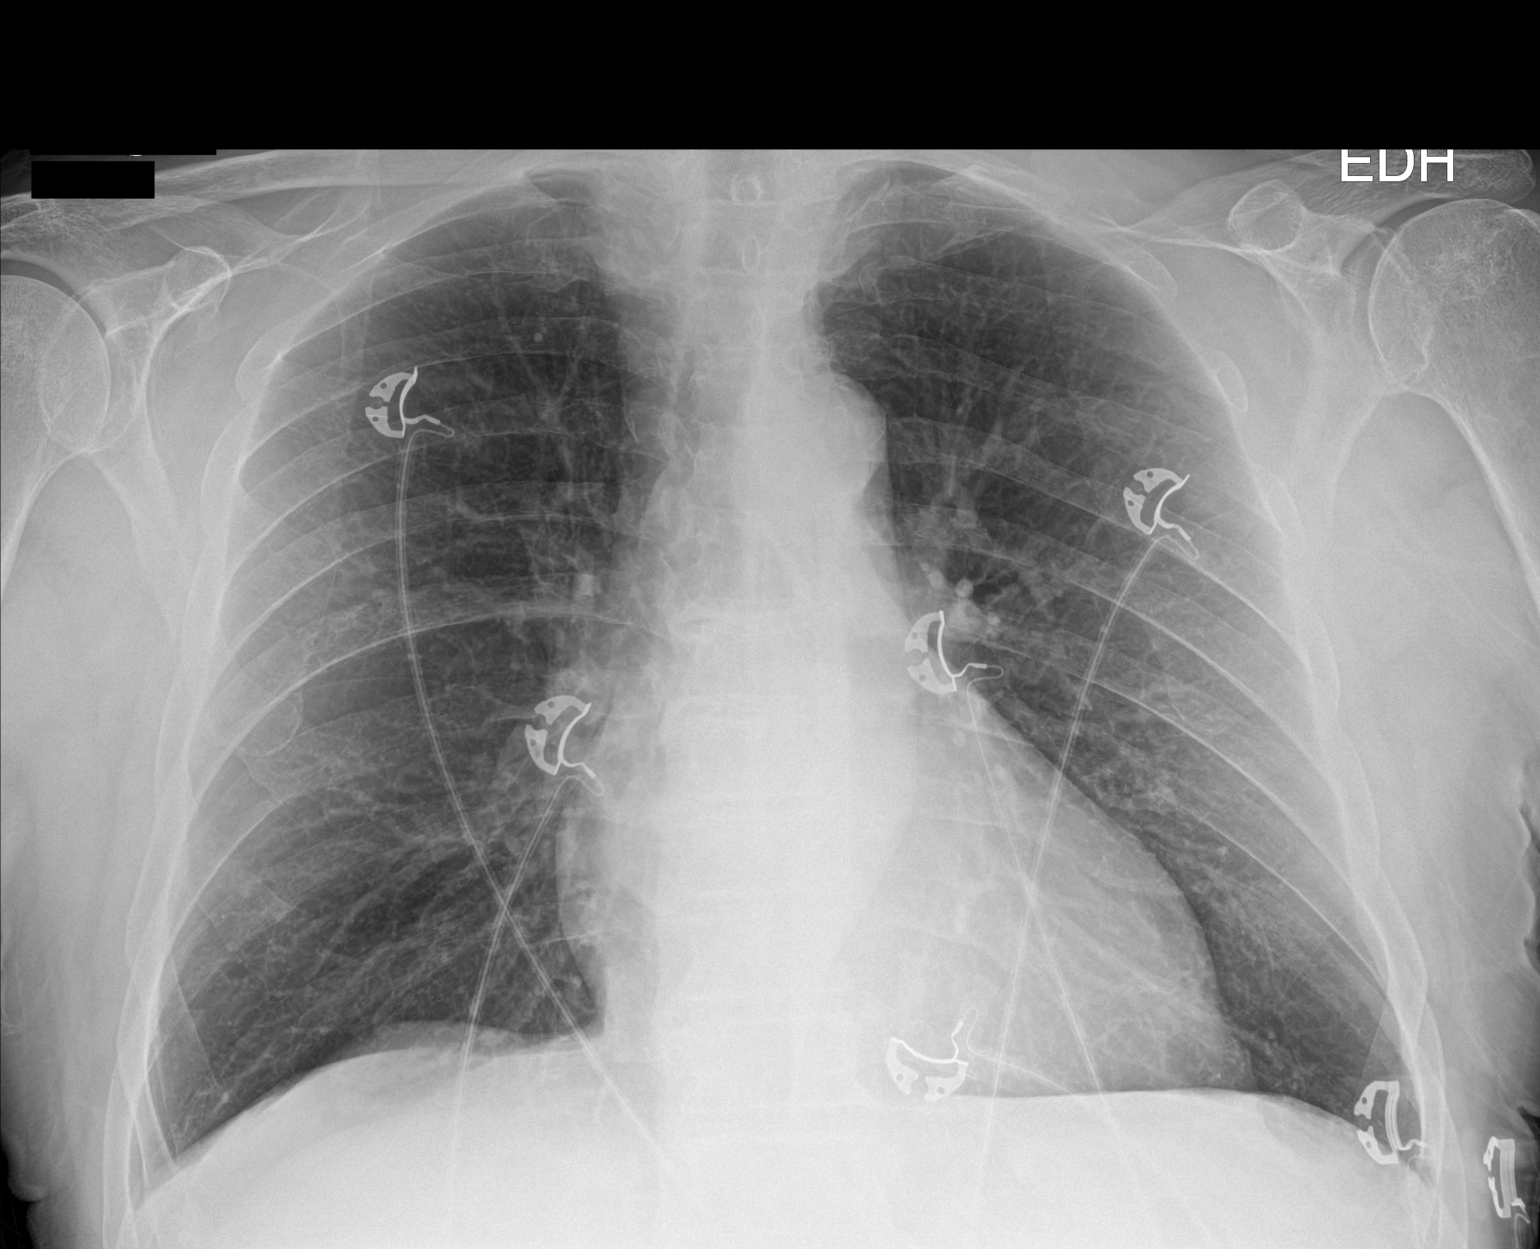

[1 of 1 positions shown; findings below may reference images not displayed]

FINDINGS: Upper normal heart size.

Mediastinal contours and pulmonary vascularity.

Lungs appear hyperinflated but clear.

No infiltrate, pleural effusion, or pneumothorax.

Diffuse osseous demineralization.
IMPRESSION: Question mild emphysematous changes.

No acute abnormalities.

## 2018-04-11 ENCOUNTER — Other Ambulatory Visit: Payer: Self-pay | Admitting: Cardiovascular Disease

## 2018-04-12 ENCOUNTER — Other Ambulatory Visit: Payer: Self-pay | Admitting: Physician Assistant

## 2018-06-02 ENCOUNTER — Other Ambulatory Visit: Payer: Self-pay | Admitting: Cardiovascular Disease

## 2018-06-03 NOTE — Telephone Encounter (Signed)
Refill Request.  

## 2018-06-03 NOTE — Telephone Encounter (Signed)
Please review for refill.  

## 2018-07-15 ENCOUNTER — Other Ambulatory Visit: Payer: Self-pay | Admitting: Cardiovascular Disease

## 2018-09-09 ENCOUNTER — Telehealth: Payer: Self-pay

## 2018-09-09 MED ORDER — LOSARTAN POTASSIUM 100 MG PO TABS: 100.0000 mg | ORAL_TABLET | ORAL | 0 refills | Status: DC

## 2018-09-09 NOTE — Telephone Encounter (Signed)
Requested Prescriptions   Signed Prescriptions Disp Refills  . losartan (COZAAR) 100 MG tablet 90 tablet 0    Sig: Take 1 tablet (100 mg total) by mouth every morning.    Authorizing Provider: [name] A    Ordering User: Raelene Bott, BRANDY L

## 2018-09-29 ENCOUNTER — Other Ambulatory Visit: Payer: Self-pay | Admitting: Cardiovascular Disease

## 2018-09-30 NOTE — Telephone Encounter (Signed)
Refill Request.  

## 2018-09-30 NOTE — Telephone Encounter (Signed)
Pt overdue to see Dr. Fletcher Anon.  Needs labs to assess kidney function prior to further refills.

## 2018-10-11 DIAGNOSIS — Z862 Personal history of diseases of the blood and blood-forming organs and certain disorders involving the immune mechanism: Secondary | ICD-10-CM | POA: Insufficient documentation

## 2018-11-02 ENCOUNTER — Other Ambulatory Visit: Payer: Self-pay | Admitting: Cardiovascular Disease

## 2018-11-04 ENCOUNTER — Other Ambulatory Visit: Payer: Self-pay | Admitting: Cardiovascular Disease

## 2018-11-04 NOTE — Telephone Encounter (Signed)
° °*  STAT* If patient is at the pharmacy, call can be transferred to refill team.   1. Which medications need to be refilled? (please list name of each medication and dose if known)   Eliquis 5 mg po BID  2. Which pharmacy/location (including street and city if local pharmacy) is medication to be sent to?    [facility] [address] [place]    3. Do they need a 30 day or 90 day supply? [place]

## 2018-11-04 NOTE — Telephone Encounter (Signed)
Refill Request.  

## 2018-11-04 NOTE — Telephone Encounter (Signed)
Pt's age 83, wt 91.2 kg, SCr 1.0, CrCl 76, last ov w/ MA 12/18/17.

## 2018-11-05 NOTE — Telephone Encounter (Signed)
lmomed pt regarding overdue labs... awaiting a callback from the pt

## 2018-11-05 NOTE — Telephone Encounter (Signed)
Refill Request.  

## 2018-11-06 NOTE — Telephone Encounter (Signed)
Per Care Everywhere, pt had labs drawn last month.  Refill sent in 11/04/18.

## 2018-12-01 ENCOUNTER — Other Ambulatory Visit: Payer: Self-pay | Admitting: Cardiovascular Disease

## 2018-12-13 ENCOUNTER — Ambulatory Visit (INDEPENDENT_AMBULATORY_CARE_PROVIDER_SITE_OTHER): Payer: Medicare HMO | Admitting: Cardiovascular Disease

## 2018-12-13 ENCOUNTER — Encounter: Payer: Self-pay | Admitting: Cardiovascular Disease

## 2018-12-13 ENCOUNTER — Other Ambulatory Visit: Payer: Self-pay

## 2018-12-13 VITALS — BP 138/60 | HR 56 | Ht 67.0 in | Wt 205.8 lb

## 2018-12-13 DIAGNOSIS — E785 Hyperlipidemia, unspecified: Secondary | ICD-10-CM | POA: Diagnosis not present

## 2018-12-13 DIAGNOSIS — I10 Essential (primary) hypertension: Secondary | ICD-10-CM | POA: Diagnosis not present

## 2018-12-13 DIAGNOSIS — I4819 Other persistent atrial fibrillation: Secondary | ICD-10-CM | POA: Diagnosis not present

## 2018-12-13 DIAGNOSIS — I5032 Chronic diastolic (congestive) heart failure: Secondary | ICD-10-CM | POA: Diagnosis not present

## 2018-12-13 MED ORDER — APIXABAN 5 MG PO TABS: 5.0000 mg | ORAL_TABLET | Freq: Two times a day (BID) | ORAL | 2 refills | Status: DC

## 2018-12-13 MED ORDER — FUROSEMIDE 20 MG PO TABS: 20.0000 mg | ORAL_TABLET | Freq: Every day | ORAL | 3 refills | Status: DC

## 2018-12-13 NOTE — Progress Notes (Signed)
Cardiology Office Note   Date:  12/13/2018   ID:  Zachary Green, DOB 11-14-1935, MRN [id number]  PCP:  [name], MD  Cardiologist:   [name], MD   Chief Complaint  Patient presents with  . OTHER    9 month f/u no complaints today. Meds reviewed verbally with pt.      History of Present Illness: Zachary Green is a 83 y.o. male who presents for a follow-up visit regarding persistent atrial fibrillation maintained in sinus rhythm on amiodarone. He has history of BPH and prostate cancer treated with radiation therapy . He had previous cardioversion and has been maintaining in sinus rhythm with small dose amiodarone. He has chronic medical conditions that include hypertension and hyperlipidemia. He is not diabetic. He smoked for 5 years in his 46s. Family history is remarkable for atrial fibrillation but no premature coronary artery disease. Nuclear stress test in 2017 was normal. Echocardiogram in 2017 showed normal ejection fraction, moderate mitral and tricuspid regurgitation and moderate pulmonary hypertension. He was treated with small dose furosemide and has been stable.  He has known mild bradycardia on small dose amiodarone and Toprol but he has been asymptomatic.  No recent chest pain, shortness of breath or palpitations.  No bleeding complications with Eliquis.  Past Medical History:  Diagnosis Date  . Arthritis   . BPH (benign prostatic hypertrophy)   . Chronic diastolic CHF (congestive heart failure) ([place])    a. echo 12/17: 55-60%, no RWMA, trivial AI, moderate MR with systolic bowing without prolapse, moderate TR, mild biatrial enlargement, PASP 50 mmHg  . Diverticulitis   . ED (erectile dysfunction)   . GERD (gastroesophageal reflux disease)   . Hyperlipidemia   . Hypertension   . Impaired fasting glucose   . Persistent atrial fibrillation    a. on Eliquis, amiodarone, Toprol; b. s/p successful dccv 03/28/16; c. CHADS2VASc at least 3 (HTN, age x  2)  . Personal history of colonic adenoma 05/23/2007   04/2007 - 6 mm adenoma  . Prostate cancer ([place])   . Rosacea   . Schatzki's ring    grade 1 varices    Past Surgical History:  Procedure Laterality Date  . COLONOSCOPY    . ELECTROPHYSIOLOGIC STUDY N/A 03/28/2016   Procedure: CARDIOVERSION;  Surgeon: Minna Merritts, MD;  Location: [facility] ORS;  Service: Cardiovascular;  Laterality: N/A;  . ESOPHAGOGASTRODUODENOSCOPY    . ESOPHAGOGASTRODUODENOSCOPY    . ESOPHAGOGASTRODUODENOSCOPY (EGD) WITH PROPOFOL N/A 08/18/2016   Procedure: ESOPHAGOGASTRODUODENOSCOPY (EGD) WITH PROPOFOL;  Surgeon: Lollie Sails, MD;  Location: [facility] ENDOSCOPY;  Service: Endoscopy;  Laterality: N/A;  . GREEN LIGHT LASER TURP (TRANSURETHRAL RESECTION OF PROSTATE N/A 08/17/2015   Procedure: GREEN LIGHT LASER TURP (TRANSURETHRAL RESECTION OF PROSTATE;  Surgeon: Royston Cowper, MD;  Location: [facility] ORS;  Service: Urology;  Laterality: N/A;  . NM MYOVIEW LTD    . PATELLA FRACTURE SURGERY     pin placed 1984  . REFRACTIVE SURGERY    . UPPER ESOPHAGEAL ENDOSCOPIC ULTRASOUND (EUS) N/A 09/21/2016   Procedure: UPPER ESOPHAGEAL ENDOSCOPIC ULTRASOUND (EUS);  Surgeon: Reita Cliche, MD;  Location: [facility] ENDOSCOPY;  Service: Gastroenterology;  Laterality: N/A;     Current Outpatient Medications  Medication Sig Dispense Refill  . amiodarone (PACERONE) 100 MG tablet TAKE 1 TABLET (100 MG TOTAL) BY MOUTH DAILY. 90 tablet 2  . docusate sodium (COLACE) 100 MG capsule Take 2 capsules (200 mg total) by mouth 2 (two) times daily. (Patient  taking differently: Take 100 mg by mouth daily. ) 120 capsule 3  . doxycycline (ADOXA) 50 MG tablet Take 50 mg by mouth daily.    Marland Kitchen ELIQUIS 5 MG TABS tablet TAKE 1 TABLET (5 MG TOTAL) BY MOUTH 2 (TWO) TIMES DAILY. NEED APPT W/ DR. Fletcher Anon FOR FURTHER REFILLS. 60 tablet 1  . esomeprazole (NEXIUM) 20 MG capsule Take 20 mg by mouth daily at 12 noon.     . furosemide (LASIX) 20 MG tablet TAKE 1 TABLET (20  MG TOTAL) BY MOUTH DAILY. 90 tablet 0  . losartan (COZAAR) 100 MG tablet TAKE 1 TABLET BY MOUTH EVERY DAY IN THE MORNING 90 tablet 0  . metoprolol succinate (TOPROL-XL) 25 MG 24 hr tablet TAKE 0.5 TABLETS (12.5 MG TOTAL) BY MOUTH DAILY. 30 tablet 2  . metroNIDAZOLE (METROGEL) 1 % gel APP 1 GRAM TOPICALLY ON THE SKIN HS    . Multiple Vitamin (MULTI-VITAMINS) TABS Take 1 tablet by mouth daily.     . Omega-3 Fatty Acids (FISH OIL PO) Take 1 capsule by mouth 2 (two) times daily.    . permethrin (ELIMITE) 5 % cream Apply 1 application topically 2 (two) times daily.     [name] Glycol-Propyl Glycol (SYSTANE OP) Place 1 drop into both eyes daily as needed (dry eyes).    . pravastatin (PRAVACHOL) 40 MG tablet TAKE ONE TABLET BY MOUTH EVERY DAY (Patient taking differently: TAKE ONE TABLET BY MOUTH EVERY EVENING) 90 tablet 3  . tamsulosin (FLOMAX) 0.4 MG CAPS capsule Take 1 capsule (0.4 mg total) by mouth daily after supper. 30 capsule 5   No current facility-administered medications for this visit.     Allergies:   Codeine    Social History:  The patient  reports that he quit smoking about 59 years ago. His smoking use included cigarettes. He has a 7.00 pack-year smoking history. He has never used smokeless tobacco. He reports that he does not drink alcohol or use drugs.   Family History:  The patient's family history includes Alzheimer's disease in his mother; Cancer in his mother; Diabetes in his brother.    ROS:  Please see the history of present illness.   Otherwise, review of systems are positive for none.   All other systems are reviewed and negative.    PHYSICAL EXAM: VS:  BP 138/60 (BP Location: Left Arm, Patient Position: Sitting, Cuff Size: Normal)   Pulse (!) 56   Ht 5\' 7"  (1.702 m)   Wt 205 lb 12 oz (93.3 kg)   SpO2 98%   BMI 32.23 kg/m  , BMI Body mass index is 32.23 kg/m. GEN: Well nourished, well developed, in no acute distress  HEENT: normal  Neck: no JVD, carotid  bruits, or masses Cardiac: RRR; no murmurs, rubs, or gallops,no edema  Respiratory:  clear to auscultation bilaterally, normal work of breathing GI: soft, nontender, nondistended, + BS MS: no deformity or atrophy  Skin: warm and dry, no rash Neuro:  Strength and sensation are intact Psych: euthymic mood, full affect   EKG:  EKG is ordered today. The ekg ordered today demonstrates sinus bradycardia with heart rate of 56 bpm.   Recent Labs: No results found for requested labs within last 8760 hours.    Lipid Panel    Component Value Date/Time   CHOL 181 11/22/2010 1012   TRIG 82.0 11/22/2010 1012   HDL 54.40 11/22/2010 1012   CHOLHDL 3 11/22/2010 1012   VLDL 16.4 11/22/2010 1012  LDLCALC 110 (H) 11/22/2010 1012   LDLDIRECT 126.2 05/01/2007 0954      Wt Readings from Last 3 Encounters:  12/13/18 205 lb 12 oz (93.3 kg)  12/18/17 201 lb (91.2 kg)  10/21/17 194 lb (88 kg)       PAD Screen 03/09/2016  Previous PAD dx? No  Previous surgical procedure? No  Pain with walking? No  Feet/toe relief with dangling? No  Painful, non-healing ulcers? No  Extremities discolored? No      ASSESSMENT AND PLAN:  1.  Persistent atrial fibrillation: He continues to maintain in sinus rhythm with small dose amiodarone 100 mg once daily.   He is tolerating anticoagulation with Eliquis with no bleeding complications.  I reviewed most recent labs done in July of this year which showed normal renal function.  CBC showed stable hemoglobin at 13.4.  He does have mild leukopenia that has been relatively stable over the last few years.  Platelet count was normal.  Liver profile was normal.  Given that he is on amiodarone, I recommend checking thyroid function with his next labs.  I will forward this to his primary care physician.  2. Essential hypertension: Blood pressure is well controlled now on current medications.  3. Aortic atherosclerosis: The patient has normal pulses and no evidence  of obstructive peripheral arterial disease.  4. Hyperlipidemia: Currently on pravastatin.  Most recent lipid profile in July showed an LDL of 109.  5. Pulmonary nodules: Followed by primary care physician.  6. Chronic diastolic heart failure with pulmonary hypertension: He appears to be euvolemic small dose furosemide.   Disposition:   FU with me in 12 months  Signed,  [name], MD  12/13/2018 10:09 AM    [place]

## 2018-12-13 NOTE — Patient Instructions (Signed)
Medication Instructions:  Your physician recommends that you continue on your current medications as directed. Please refer to the Current Medication list given to you today.  If you need a refill on your cardiac medications before your next appointment, please call your pharmacy.   Lab work: None ordered If you have labs (blood work) drawn today and your tests are completely normal, you will receive your results only by: . MyChart Message (if you have MyChart) OR . A paper copy in the mail If you have any lab test that is abnormal or we need to change your treatment, we will call you to review the results.  Testing/Procedures: None ordered  Follow-Up: At [facility], you and your health needs are our priority.  As part of our continuing mission to provide you with exceptional heart care, we have created designated Provider Care Teams.  These Care Teams include your primary Cardiologist (physician) and Advanced Practice Providers (APPs -  Physician Assistants and Nurse Practitioners) who all work together to provide you with the care you need, when you need it. You will need a follow up appointment in 12 months.  Please call our office 2 months in advance to schedule this appointment.  You may see  Dr. Arida or one of the following Advanced Practice Providers on your designated Care Team:   Christopher Berge, NP Ryan Dunn, PA-C . Jacquelyn Visser, PA-C  Any Other Special Instructions Will Be Listed Below (If Applicable). N/A   

## 2019-01-03 ENCOUNTER — Other Ambulatory Visit: Payer: Self-pay | Admitting: Physician Assistant

## 2019-01-23 ENCOUNTER — Other Ambulatory Visit: Payer: Self-pay | Admitting: Cardiovascular Disease

## 2019-03-15 ENCOUNTER — Other Ambulatory Visit: Payer: Self-pay | Admitting: Cardiovascular Disease

## 2019-03-30 ENCOUNTER — Other Ambulatory Visit: Payer: Self-pay | Admitting: Cardiovascular Disease

## 2019-03-31 NOTE — Telephone Encounter (Signed)
Pt's age 84, wt 93.3 kg, SCr 1.0, CrCl 76.45, last ov w/ MA 12/13/18.

## 2019-03-31 NOTE — Telephone Encounter (Signed)
Refill Request.  

## 2019-06-25 ENCOUNTER — Other Ambulatory Visit: Payer: Self-pay | Admitting: Cardiovascular Disease

## 2019-06-25 NOTE — Telephone Encounter (Signed)
Pt's age 84, wt 93.3 kg, SCr 1.0, CrCl 76.45, last ov w/ MA 12/13/18.

## 2019-06-25 NOTE — Telephone Encounter (Signed)
Refill Request.  

## 2019-07-07 ENCOUNTER — Ambulatory Visit: Payer: Medicare HMO | Admitting: Dermatology

## 2019-07-07 ENCOUNTER — Other Ambulatory Visit: Payer: Self-pay

## 2019-07-07 DIAGNOSIS — L570 Actinic keratosis: Secondary | ICD-10-CM

## 2019-07-07 DIAGNOSIS — L820 Inflamed seborrheic keratosis: Secondary | ICD-10-CM | POA: Diagnosis not present

## 2019-07-07 DIAGNOSIS — L578 Other skin changes due to chronic exposure to nonionizing radiation: Secondary | ICD-10-CM

## 2019-07-07 DIAGNOSIS — D692 Other nonthrombocytopenic purpura: Secondary | ICD-10-CM | POA: Diagnosis not present

## 2019-07-07 DIAGNOSIS — L821 Other seborrheic keratosis: Secondary | ICD-10-CM | POA: Diagnosis not present

## 2019-07-07 NOTE — Patient Instructions (Signed)

## 2019-07-07 NOTE — Progress Notes (Signed)
   Follow-Up Visit   Subjective  Zachary Green is a 84 y.o. male who presents for the following: Follow-up (AK follow up of face and scalp x10 treated with LN2.) and Other (ISK follow up of bilateral legs treated with LN2.).    The following portions of the chart were reviewed this encounter and updated as appropriate:     Review of Systems: No other skin or systemic complaints.  Objective  Well appearing patient in no apparent distress; mood and affect are within normal limits.  A focused examination was performed including face, scalp, legs. Relevant physical exam findings are noted in the Assessment and Plan.  No skin findings found.  Assessment & Plan   No follow-ups on file.

## 2019-07-07 NOTE — Progress Notes (Signed)
   Follow-Up Visit   Subjective  Zachary Green is a 84 y.o. male who presents for the following: Follow-up (AK follow up of face and scalp x10 treated with LN2.) and Other (ISK follow up of bilateral legs treated with LN2.).  He has several spots today to be evaluated.  The following portions of the chart were reviewed this encounter and updated as appropriate: Tobacco  Allergies  Meds  Problems  Med Hx  Surg Hx  Fam Hx      Review of Systems: No other skin or systemic complaints.  Objective  Well appearing patient in no apparent distress; mood and affect are within normal limits.  A focused examination was performed including scalp, face, arms. Relevant physical exam findings are noted in the Assessment and Plan.  Objective  Scalp, face, arms (22): Erythematous thin papules/macules with gritty scale.   Objective  Scalp, face, arms (16): Erythematous keratotic or waxy stuck-on papule or plaque.   Assessment & Plan   Actinic Damage - diffuse scaly erythematous macules with underlying dyspigmentation - Recommend daily broad spectrum sunscreen SPF 30+ to sun-exposed areas, reapply every 2 hours as needed.  - Call for new or changing lesions.  Purpura - Violaceous macules and patches - Benign - Related to age, sun damage and/or use of blood thinners - Observe - Can use OTC arnica containing moisturizer such as Dermend Bruise Formula if desired - Call for worsening or other concerns  Seborrheic Keratoses - Stuck-on, waxy, tan-brown papules and plaques  - Discussed benign etiology and prognosis. - Observe - Call for any changes   AK (actinic keratosis) (22) Scalp, face, arms  Destruction of lesion - Scalp, face, arms Complexity: simple   Destruction method: cryotherapy   Informed consent: discussed and consent obtained   Timeout:  patient name, date of birth, surgical site, and procedure verified Lesion destroyed using liquid nitrogen: Yes   Region frozen  until ice ball extended beyond lesion: Yes   Outcome: patient tolerated procedure well with no complications   Post-procedure details: wound care instructions given    Inflamed seborrheic keratosis (16) Scalp, face, arms  Destruction of lesion - Scalp, face, arms Complexity: simple   Destruction method: cryotherapy   Informed consent: discussed and consent obtained   Timeout:  patient name, date of birth, surgical site, and procedure verified Lesion destroyed using liquid nitrogen: Yes   Region frozen until ice ball extended beyond lesion: Yes   Outcome: patient tolerated procedure well with no complications   Post-procedure details: wound care instructions given    Return in about 4 months (around 11/06/2019).   I, Ashok Cordia, CMA, am acting as scribe for Sarina Ser, MD . Documentation: I have reviewed the above documentation for accuracy and completeness, and I agree with the above.  Sarina Ser, MD

## 2019-07-08 ENCOUNTER — Encounter: Payer: Self-pay | Admitting: Dermatology

## 2019-07-23 ENCOUNTER — Other Ambulatory Visit: Payer: Self-pay | Admitting: Cardiovascular Disease

## 2019-09-09 ENCOUNTER — Other Ambulatory Visit: Payer: Self-pay

## 2019-09-09 ENCOUNTER — Ambulatory Visit (INDEPENDENT_AMBULATORY_CARE_PROVIDER_SITE_OTHER): Payer: Medicare HMO | Admitting: Ophthalmology

## 2019-09-09 ENCOUNTER — Encounter (INDEPENDENT_AMBULATORY_CARE_PROVIDER_SITE_OTHER): Payer: Self-pay | Admitting: Ophthalmology

## 2019-09-09 DIAGNOSIS — H353114 Nonexudative age-related macular degeneration, right eye, advanced atrophic with subfoveal involvement: Secondary | ICD-10-CM

## 2019-09-09 DIAGNOSIS — H3521 Other non-diabetic proliferative retinopathy, right eye: Secondary | ICD-10-CM

## 2019-09-09 DIAGNOSIS — H35371 Puckering of macula, right eye: Secondary | ICD-10-CM | POA: Diagnosis not present

## 2019-09-09 NOTE — Assessment & Plan Note (Signed)
OD Minor

## 2019-09-09 NOTE — Assessment & Plan Note (Signed)
ARMD, stable.

## 2019-09-09 NOTE — Patient Instructions (Signed)
Age-Related Macular Degeneration  Age-related macular degeneration (AMD) is an eye disease related to aging. The disease causes a loss of central vision. Central vision allows a person to see objects clearly and do daily tasks like reading and driving. There are two main types of AMD:  Dry AMD. People with this type generally lose their vision slowly. This is the most common type of AMD. Some people with dry AMD notice very little change in their vision as they age.  Wet AMD. People with this type can lose their vision quickly. What are the causes? This condition is caused by damage to the part of the eye that provides you with central vision (macula).  Dry AMD happens when deposits in the macula cause light-sensitive cells to slowly break down.  Wet AMD happens when abnormal blood vessels grow under the macula and leak blood and fluid. What increases the risk? You are more likely to develop this condition if you:  Are 50 years old or older, and especially 75 years old or older.  Smoke.  Are obese.  Have a family history of AMD.  Have high cholesterol, high blood pressure, or heart disease.  Have been exposed to high levels of ultraviolet (UV) light and blue light.  Are white (Caucasian).  Are male. What are the signs or symptoms? Common symptoms of this condition include:  Blurred vision, especially when reading print material. The blurred vision often improves in brighter light.  A blurred or blind spot in the center of your field of vision that is small but growing larger.  Bright colors seeming less bright than they used to be.  Decreased ability to recognize and see faces.  One eye seeing worse than the other.  Decreased ability to adapt to dimly lit rooms.  Straight lines appearing crooked or wavy. How is this diagnosed? This condition is diagnosed based on your symptoms and an eye exam. During the eye exam:  Eye drops will be placed into your eyes to  enlarge (dilate) your pupils. This will allow your health care provider to see the back of your eye.  You may be asked to look at an image that looks like a checkerboard (Amsler grid). Early changes in your central vision may cause the grid to appear distorted. After the exam, you may be given one or both of these tests:  Fluorescein angiogram. This test determines whether you have dry or wet AMD.  Optical coherence tomography (OCT) test to evaluate deep layers of the retina. How is this treated? There is no cure for this condition, but treatment can help to slow down progression of the disease. This condition may be treated with:  Supplements, including vitamin C, vitamin E, beta carotene, and zinc.  Laser surgery to destroy new blood vessels or leaking blood vessels in your eye.  Injections of medicines into your eye to slow down the formation of abnormal blood vessels that may leak. These injections may need to be repeated on a routine basis. Follow these instructions at home:  Take over-the-counter and prescription medicines only as told by your health care provider.  Take vitamins and supplements as told by your health care provider.  Ask your health care provider for an Amsler grid. Use it every day to check each eye for vision changes.  Get an eye exam as often as told by your health care provider. Make sure to get an eye exam at least once every year.  Keep all follow-up visits as told by   your health care provider. This is important. Contact a health care provider if:  You notice any new changes in your vision. Get help right away if:  You suddenly lose vision or develop pain in the eye. Summary  Age-related macular degeneration (AMD) is an eye disease related to aging. There are two types of this condition: dry AMD and wet AMD.  This condition is caused by damage to the part of the eye that provides you with central vision (macula).  Once diagnosed with AMD, make sure  to get an eye exam every year, take supplements and vitamins as directed, use an Amsler grid at home, and follow up with your health care provider. This information is not intended to replace advice given to you by your health care provider. Make sure you discuss any questions you have with your health care provider. Document Revised: 09/19/2017 Document Reviewed: 09/19/2017 Elsevier Patient Education  2020 [employer]  

## 2019-09-09 NOTE — Progress Notes (Signed)
09/09/2019     CHIEF COMPLAINT Patient presents for Retina Follow Up   HISTORY OF PRESENT ILLNESS: Zachary Green is a 84 y.o. male who presents to the clinic today for:   HPI    Retina Follow Up    Patient presents with  Dry AMD.  In both eyes.  This started 1 year ago.  Severity is mild.  Duration of 1 year.  Since onset it is stable.          Comments    1 Year AMD F/U OU  Pt denies noticeable changes to [place] OU since last visit. Pt denies ocular pain, flashes of light, or floaters OU.  LBS: does not check regularly A1c: pt does not recall       Last edited by Rockie Neighbours, [place] on 09/09/2019  1:01 PM. (History)      Referring physician: [name], MD [facility] [place],  [place] [id number]  HISTORICAL INFORMATION:   Selected notes from the MEDICAL RECORD NUMBER    Lab Results  Component Value Date   HGBA1C 5.8 11/22/2010     CURRENT MEDICATIONS: Current Outpatient Medications (Ophthalmic Drugs)  Medication Sig  . Polyethyl Glycol-Propyl Glycol (SYSTANE OP) Place 1 drop into both eyes daily as needed (dry eyes).   No current facility-administered medications for this visit. (Ophthalmic Drugs)   Current Outpatient Medications (Other)  Medication Sig  . amiodarone (PACERONE) 100 MG tablet TAKE 1 TABLET (100 MG TOTAL) BY MOUTH DAILY.  Marland Kitchen diclofenac Sodium (VOLTAREN) 1 % GEL Apply topically.  . docusate sodium (COLACE) 100 MG capsule Take 2 capsules (200 mg total) by mouth 2 (two) times daily. (Patient taking differently: Take 100 mg by mouth daily. )  . doxycycline (ADOXA) 50 MG tablet Take 50 mg by mouth daily.  Marland Kitchen doxycycline (VIBRAMYCIN) 50 MG capsule Take 50 mg by mouth daily.  Marland Kitchen ELIQUIS 5 MG TABS tablet TAKE 1 TABLET BY MOUTH TWICE A DAY  . esomeprazole (NEXIUM) 20 MG capsule Take 20 mg by mouth daily at 12 noon.   . furosemide (LASIX) 20 MG tablet Take 1 tablet (20 mg total) by mouth daily.  Marland Kitchen losartan (COZAAR) 100  MG tablet TAKE 1 TABLET BY MOUTH EVERY DAY IN THE MORNING  . metoprolol succinate (TOPROL-XL) 25 MG 24 hr tablet TAKE 0.5 TABLETS (12.5 MG TOTAL) BY MOUTH DAILY.  . metroNIDAZOLE (METROGEL) 1 % gel APP 1 GRAM TOPICALLY ON THE SKIN HS  . Multiple Vitamin (MULTI-VITAMINS) TABS Take 1 tablet by mouth daily.   . Omega-3 Fatty Acids (FISH OIL PO) Take 1 capsule by mouth 2 (two) times daily.  . permethrin (ELIMITE) 5 % cream Apply 1 application topically 2 (two) times daily.   . pravastatin (PRAVACHOL) 40 MG tablet TAKE ONE TABLET BY MOUTH EVERY DAY (Patient taking differently: TAKE ONE TABLET BY MOUTH EVERY EVENING)  . tamsulosin (FLOMAX) 0.4 MG CAPS capsule Take 1 capsule (0.4 mg total) by mouth daily after supper.   No current facility-administered medications for this visit. (Other)      REVIEW OF SYSTEMS:    ALLERGIES Allergies  Allergen Reactions  . Codeine Nausea Only    PAST MEDICAL HISTORY Past Medical History:  Diagnosis Date  . Actinic keratosis   . Arthritis   . Basal cell carcinoma 12/24/2013   Left sup. lat. chest. Nodular pattern. Tx: EDC  . Basal cell carcinoma 12/23/2014   Right lateral top of shoulder. Nodular pattern.  Tx: EDC  . Basal cell carcinoma 01/05/2016   Right ant. shoulder infraclavicular. Nodular pattern. Tx: EDC  . BPH (benign prostatic hypertrophy)   . Chronic diastolic CHF (congestive heart failure) ([place])    a. echo 12/17: 55-60%, no RWMA, trivial AI, moderate MR with systolic bowing without prolapse, moderate TR, mild biatrial enlargement, PASP 50 mmHg  . Diverticulitis   . ED (erectile dysfunction)   . GERD (gastroesophageal reflux disease)   . Hyperlipidemia   . Hypertension   . Impaired fasting glucose   . Persistent atrial fibrillation (HCC)    a. on Eliquis, amiodarone, Toprol; b. s/p successful dccv 03/28/16; c. CHADS2VASc at least 3 (HTN, age x 2)  . Personal history of colonic adenoma 05/23/2007   04/2007 - 6 mm adenoma  . Prostate cancer  ([place])   . Rosacea   . Schatzki's ring    grade 1 varices   Past Surgical History:  Procedure Laterality Date  . COLONOSCOPY    . ELECTROPHYSIOLOGIC STUDY N/A 03/28/2016   Procedure: CARDIOVERSION;  Surgeon: Minna Merritts, MD;  Location: [facility] ORS;  Service: Cardiovascular;  Laterality: N/A;  . ESOPHAGOGASTRODUODENOSCOPY    . ESOPHAGOGASTRODUODENOSCOPY    . ESOPHAGOGASTRODUODENOSCOPY (EGD) WITH PROPOFOL N/A 08/18/2016   Procedure: ESOPHAGOGASTRODUODENOSCOPY (EGD) WITH PROPOFOL;  Surgeon: Lollie Sails, MD;  Location: [facility] ENDOSCOPY;  Service: Endoscopy;  Laterality: N/A;  . GREEN LIGHT LASER TURP (TRANSURETHRAL RESECTION OF PROSTATE N/A 08/17/2015   Procedure: GREEN LIGHT LASER TURP (TRANSURETHRAL RESECTION OF PROSTATE;  Surgeon: Royston Cowper, MD;  Location: [facility] ORS;  Service: Urology;  Laterality: N/A;  . NM MYOVIEW LTD    . PATELLA FRACTURE SURGERY     pin placed 1984  . REFRACTIVE SURGERY    . UPPER ESOPHAGEAL ENDOSCOPIC ULTRASOUND (EUS) N/A 09/21/2016   Procedure: UPPER ESOPHAGEAL ENDOSCOPIC ULTRASOUND (EUS);  Surgeon: Reita Cliche, MD;  Location: [facility] ENDOSCOPY;  Service: Gastroenterology;  Laterality: N/A;    FAMILY HISTORY Family History  Problem Relation Age of Onset  . Alzheimer's disease Mother   . Cancer Mother        Melanoma  . Diabetes Brother     SOCIAL HISTORY Social History   Tobacco Use  . Smoking status: Former Smoker    Packs/day: 1.00    Years: 7.00    Pack years: 7.00    Types: Cigarettes    Quit date: 03/28/1959    Years since quitting: 60.4  . Smokeless tobacco: Never Used  Vaping Use  . Vaping Use: Never used  Substance Use Topics  . Alcohol use: No  . Drug use: No         OPHTHALMIC EXAM:  Base Eye Exam    Visual Acuity (ETDRS)      Right Left   Dist cc E card @ 3' 20/25 -2   Dist ph cc NI    Correction: Glasses       Tonometry (Tonopen, 1:05 PM)      Right Left   Pressure 13 12       Pupils      Pupils Dark Light  Shape React APD   Right PERRL 4 3 Round Brisk None   Left PERRL 4 3 Round Brisk None       Visual Fields (Counting fingers)      Left Right    Full    Restrictions  Total inferior temporal, inferior nasal deficiencies       Extraocular Movement  Right Left    Full Full       Neuro/Psych    Oriented x3: Yes   Mood/Affect: Normal       Dilation    Both eyes: 1.0% Mydriacyl, 2.5% Phenylephrine @ 1:05 PM        Slit Lamp and Fundus Exam    External Exam      Right Left   External Normal Normal       Slit Lamp Exam      Right Left   Lids/Lashes Normal Normal   Conjunctiva/Sclera White and quiet White and quiet   Cornea Clear Clear   Anterior Chamber Deep and quiet Deep and quiet   Iris Round and reactive Round and reactive   Lens Centered posterior chamber intraocular lens, Open posterior capsule Centered posterior chamber intraocular lens, Open posterior capsule   Anterior Vitreous Normal Normal       Fundus Exam      Right Left   Posterior Vitreous Vitrectomized Normal   Disc Normal Normal   C/D Ratio 0.6 0.4   Macula Chronic macular edema with central foveal scarring Hard drusen, Early age related macular degeneration   Vessels Old retinal  vascular occlusions Normal   Periphery Good PRP peripherally Normal          IMAGING AND PROCEDURES  Imaging and Procedures for 09/09/19  OCT, Retina - OU - Both Eyes       Right Eye Quality was good. Scan locations included subfoveal. Central Foveal Thickness: 359. Progression has been stable. Findings include cystoid macular edema, central retinal atrophy, outer retinal atrophy.   Left Eye Quality was good. Scan locations included subfoveal. Central Foveal Thickness: 291. Progression has been stable. Findings include retinal drusen .   Notes Review of diffuse amorphous atrophy, nontopographic distorting epiretinal membrane         Color Fundus Photography Optos - OU - Both Eyes       Right  Eye Progression has been stable. Disc findings include normal observations. Macula : drusen, exudates, geographic atrophy.   Left Eye Progression has been stable. Disc findings include normal observations. Macula : drusen. Vessels : normal observations. Periphery : normal observations.   Notes Disciform macular scar OD old branch retinal artery occlusion superotemporal, good PRP superotemporal                ASSESSMENT/PLAN:  Advanced nonexudative age-related macular degeneration of right eye with subfoveal involvement ARMD, stable.  Right epiretinal membrane OD Minor  Nondiabetic proliferative retinopathy, right Secondary to old branch retinal artery occlusion, quiesced sent status post PRP      ICD-10-CM   1. Advanced nonexudative age-related macular degeneration of right eye with subfoveal involvement  H35.3114 OCT, Retina - OU - Both Eyes    Color Fundus Photography Optos - OU - Both Eyes  2. Nondiabetic proliferative retinopathy, right  H35.21   3. Right epiretinal membrane  H35.371     1.  2.  3.  Ophthalmic Meds Ordered this visit:  No orders of the defined types were placed in this encounter.      Return in about 1 year (around 09/08/2020) for COLOR FP, DILATE OU.  Patient Instructions  Age-Related Macular Degeneration  Age-related macular degeneration (AMD) is an eye disease related to aging. The disease causes a loss of central vision. Central vision allows a person to see objects clearly and do daily tasks like reading and driving. There are two main types of AMD:  Dry  AMD. People with this type generally lose their vision slowly. This is the most common type of AMD. Some people with dry AMD notice very little change in their vision as they age.  Wet AMD. People with this type can lose their vision quickly. What are the causes? This condition is caused by damage to the part of the eye that provides you with central vision (macula).  Dry AMD  happens when deposits in the macula cause light-sensitive cells to slowly break down.  Wet AMD happens when abnormal blood vessels grow under the macula and leak blood and fluid. What increases the risk? You are more likely to develop this condition if you:  Are 83 years old or older, and especially 73 years old or older.  Smoke.  Are obese.  Have a family history of AMD.  Have high cholesterol, high blood pressure, or heart disease.  Have been exposed to high levels of ultraviolet (UV) light and blue light.  Are white (Caucasian).  Are male. What are the signs or symptoms? Common symptoms of this condition include:  Blurred vision, especially when reading print material. The blurred vision often improves in brighter light.  A blurred or blind spot in the center of your field of vision that is small but growing larger.  Bright colors seeming less bright than they used to be.  Decreased ability to recognize and see faces.  One eye seeing worse than the other.  Decreased ability to adapt to dimly lit rooms.  Straight lines appearing crooked or wavy. How is this diagnosed? This condition is diagnosed based on your symptoms and an eye exam. During the eye exam:  Eye drops will be placed into your eyes to enlarge (dilate) your pupils. This will allow your health care provider to see the back of your eye.  You may be asked to look at an image that looks like a checkerboard (Amsler grid). Early changes in your central vision may cause the grid to appear distorted. After the exam, you may be given one or both of these tests:  Fluorescein angiogram. This test determines whether you have dry or wet AMD.  Optical coherence tomography (OCT) test to evaluate deep layers of the retina. How is this treated? There is no cure for this condition, but treatment can help to slow down progression of the disease. This condition may be treated with:  Supplements, including vitamin C,  vitamin E, beta carotene, and zinc.  Laser surgery to destroy new blood vessels or leaking blood vessels in your eye.  Injections of medicines into your eye to slow down the formation of abnormal blood vessels that may leak. These injections may need to be repeated on a routine basis. Follow these instructions at home:  Take over-the-counter and prescription medicines only as told by your health care provider.  Take vitamins and supplements as told by your health care provider.  Ask your health care provider for an Amsler grid. Use it every day to check each eye for vision changes.  Get an eye exam as often as told by your health care provider. Make sure to get an eye exam at least once every year.  Keep all follow-up visits as told by your health care provider. This is important. Contact a health care provider if:  You notice any new changes in your vision. Get help right away if:  You suddenly lose vision or develop pain in the eye. Summary  Age-related macular degeneration (AMD) is an eye disease  related to aging. There are two types of this condition: dry AMD and wet AMD.  This condition is caused by damage to the part of the eye that provides you with central vision (macula).  Once diagnosed with AMD, make sure to get an eye exam every year, take supplements and vitamins as directed, use an Amsler grid at home, and follow up with your health care provider. This information is not intended to replace advice given to you by your health care provider. Make sure you discuss any questions you have with your health care provider. Document Revised: 09/19/2017 Document Reviewed: 09/19/2017 Elsevier Patient Education  [place] the diagnoses, plan, and follow up with the patient and they expressed understanding.  Patient expressed understanding of the importance of proper follow up care.   Clent Demark Denario Bagot M.D. Diseases & Surgery of the Retina and  Vitreous Retina & Diabetic [place] 09/09/19     Abbreviations: M myopia (nearsighted); A astigmatism; H hyperopia (farsighted); P presbyopia; Mrx spectacle prescription;  CTL contact lenses; OD right eye; OS left eye; OU both eyes  XT exotropia; ET esotropia; PEK punctate epithelial keratitis; PEE punctate epithelial erosions; DES dry eye syndrome; MGD meibomian gland dysfunction; ATs artificial tears; PFAT's preservative free artificial tears; [place] nuclear sclerotic cataract; PSC posterior subcapsular cataract; ERM epi-retinal membrane; PVD posterior vitreous detachment; RD retinal detachment; DM diabetes mellitus; DR diabetic retinopathy; NPDR non-proliferative diabetic retinopathy; PDR proliferative diabetic retinopathy; CSME clinically significant macular edema; DME diabetic macular edema; dbh dot blot hemorrhages; CWS cotton wool spot; POAG primary open angle glaucoma; C/D cup-to-disc ratio; HVF humphrey visual field; GVF goldmann visual field; OCT optical coherence tomography; IOP intraocular pressure; BRVO Branch retinal vein occlusion; CRVO central retinal vein occlusion; CRAO central retinal artery occlusion; BRAO branch retinal artery occlusion; RT retinal tear; SB scleral buckle; PPV pars plana vitrectomy; VH Vitreous hemorrhage; PRP panretinal laser photocoagulation; IVK intravitreal kenalog; VMT vitreomacular traction; MH Macular hole;  NVD neovascularization of the disc; NVE neovascularization elsewhere; AREDS age related eye disease study; ARMD age related macular degeneration; POAG primary open angle glaucoma; EBMD epithelial/anterior basement membrane dystrophy; ACIOL anterior chamber intraocular lens; IOL intraocular lens; PCIOL posterior chamber intraocular lens; Phaco/IOL phacoemulsification with intraocular lens placement; [place] photorefractive keratectomy; LASIK laser assisted in situ keratomileusis; HTN hypertension; DM diabetes mellitus; COPD chronic obstructive pulmonary disease

## 2019-09-09 NOTE — Assessment & Plan Note (Signed)
Secondary to old branch retinal artery occlusion, quiesced sent status post PRP

## 2019-09-11 ENCOUNTER — Other Ambulatory Visit: Payer: Self-pay | Admitting: Dermatology

## 2019-11-06 ENCOUNTER — Other Ambulatory Visit: Payer: Self-pay

## 2019-11-06 ENCOUNTER — Ambulatory Visit: Payer: Medicare HMO | Admitting: Dermatology

## 2019-11-06 DIAGNOSIS — L219 Seborrheic dermatitis, unspecified: Secondary | ICD-10-CM | POA: Diagnosis not present

## 2019-11-06 DIAGNOSIS — L570 Actinic keratosis: Secondary | ICD-10-CM

## 2019-11-06 DIAGNOSIS — L719 Rosacea, unspecified: Secondary | ICD-10-CM | POA: Diagnosis not present

## 2019-11-06 DIAGNOSIS — L578 Other skin changes due to chronic exposure to nonionizing radiation: Secondary | ICD-10-CM

## 2019-11-06 DIAGNOSIS — L821 Other seborrheic keratosis: Secondary | ICD-10-CM | POA: Diagnosis not present

## 2019-11-06 DIAGNOSIS — L820 Inflamed seborrheic keratosis: Secondary | ICD-10-CM | POA: Diagnosis not present

## 2019-11-06 MED ORDER — MUPIROCIN 2 % EX OINT: TOPICAL_OINTMENT | CUTANEOUS | 3 refills | Status: DC

## 2019-11-06 MED ORDER — PERMETHRIN 5 % EX CREA
TOPICAL_CREAM | CUTANEOUS | 6 refills | Status: DC
Start: 2019-11-06 — End: 2019-12-11

## 2019-11-06 MED ORDER — METRONIDAZOLE 1 % EX GEL: CUTANEOUS | 6 refills | Status: DC

## 2019-11-06 NOTE — Progress Notes (Signed)
   Follow-Up Visit   Subjective  Zachary Green is a 84 y.o. male who presents for the following: Actinic Keratosis (recheck sun exposed areas for any new or residual AK's ). Patient has noticed other crusted irritated skin lesions on his arms and legs that he would like checked.  The following portions of the chart were reviewed this encounter and updated as appropriate:  Tobacco  Allergies  Meds  Problems  Med Hx  Surg Hx  Fam Hx     Review of Systems:  No other skin or systemic complaints except as noted in HPI or Assessment and Plan.  Objective  Well appearing patient in no apparent distress; mood and affect are within normal limits.  A focused examination was performed including face, scalp, ears, and arms. Relevant physical exam findings are noted in the Assessment and Plan.  Objective  R ear: Erythematous thin papules/macules with gritty scale.   Objective  arms and legs (25): Erythematous keratotic or waxy stuck-on papule or plaque.   Objective  B/L lower legs: Stasis changes  Objective  Head - Anterior (Face): Pinkness of the face   Assessment & Plan  AK (actinic keratosis) R ear  Destruction of lesion - R ear Complexity: simple   Destruction method: cryotherapy   Informed consent: discussed and consent obtained   Timeout:  patient name, date of birth, surgical site, and procedure verified Lesion destroyed using liquid nitrogen: Yes   Region frozen until ice ball extended beyond lesion: Yes   Outcome: patient tolerated procedure well with no complications   Post-procedure details: wound care instructions given    Inflamed seborrheic keratosis (25) arms and legs  Destruction of lesion - arms and legs Complexity: simple   Destruction method: cryotherapy   Informed consent: discussed and consent obtained   Timeout:  patient name, date of birth, surgical site, and procedure verified Lesion destroyed using liquid nitrogen: Yes   Region frozen until  ice ball extended beyond lesion: Yes   Outcome: patient tolerated procedure well with no complications   Post-procedure details: wound care instructions given    mupirocin ointment (BACTROBAN) 2 % - arms and legs  Seborrheic dermatitis B/L lower legs  Recommend compression stockings daily.   Rosacea Head - Anterior (Face)  Continue alternating Metronidazole 1% gel with Permethrin 5% cream QOD.   metroNIDAZOLE (METROGEL) 1 % gel - Head - Anterior (Face)  permethrin (ELIMITE) 5 % cream - Head - Anterior (Face)   Actinic Damage - diffuse scaly erythematous macules with underlying dyspigmentation - Recommend daily broad spectrum sunscreen SPF 30+ to sun-exposed areas, reapply every 2 hours as needed.  - Call for new or changing lesions.  Seborrheic Keratoses - Stuck-on, waxy, tan-brown papules and plaques  - Discussed benign etiology and prognosis. - Observe - Call for any changes  Return in about 6 months (around 05/08/2020).  [place] Redo, CMA, am acting as scribe for Sarina Ser, MD .  Documentation: I have reviewed the above documentation for accuracy and completeness, and I agree with the above.  Sarina Ser, MD

## 2019-11-11 ENCOUNTER — Encounter: Payer: Self-pay | Admitting: Dermatology

## 2019-12-06 ENCOUNTER — Other Ambulatory Visit: Payer: Self-pay | Admitting: Cardiovascular Disease

## 2019-12-11 ENCOUNTER — Ambulatory Visit: Payer: Medicare HMO | Admitting: Cardiovascular Disease

## 2019-12-11 ENCOUNTER — Other Ambulatory Visit: Payer: Self-pay

## 2019-12-11 ENCOUNTER — Encounter: Payer: Self-pay | Admitting: Cardiovascular Disease

## 2019-12-11 VITALS — BP 140/64 | HR 71 | Ht 69.0 in | Wt 212.1 lb

## 2019-12-11 DIAGNOSIS — I5032 Chronic diastolic (congestive) heart failure: Secondary | ICD-10-CM | POA: Diagnosis not present

## 2019-12-11 DIAGNOSIS — I4819 Other persistent atrial fibrillation: Secondary | ICD-10-CM

## 2019-12-11 DIAGNOSIS — I10 Essential (primary) hypertension: Secondary | ICD-10-CM | POA: Diagnosis not present

## 2019-12-11 NOTE — Patient Instructions (Signed)
Medication Instructions:  Your physician recommends that you continue on your current medications as directed. Please refer to the Current Medication list given to you today.  *If you need a refill on your cardiac medications before your next appointment, please call your pharmacy*   Lab Work: None ordered If you have labs (blood work) drawn today and your tests are completely normal, you will receive your results only by: . MyChart Message (if you have MyChart) OR . A paper copy in the mail If you have any lab test that is abnormal or we need to change your treatment, we will call you to review the results.   Testing/Procedures: None ordered   Follow-Up: At [facility], you and your health needs are our priority.  As part of our continuing mission to provide you with exceptional heart care, we have created designated Provider Care Teams.  These Care Teams include your primary Cardiologist (physician) and Advanced Practice Providers (APPs -  Physician Assistants and Nurse Practitioners) who all work together to provide you with the care you need, when you need it.  We recommend signing up for the patient portal called "MyChart".  Sign up information is provided on this After Visit Summary.  MyChart is used to connect with patients for Virtual Visits (Telemedicine).  Patients are able to view lab/test results, encounter notes, upcoming appointments, etc.  Non-urgent messages can be sent to your provider as well.   To learn more about what you can do with MyChart, go to [email or web address].    Your next appointment:   12 month(s)  The format for your next appointment:   In Person  Provider:    You may see Dr. Arida or one of the following Advanced Practice Providers on your designated Care Team:    Christopher Berge, NP  Ryan Dunn, PA-C  Jacquelyn Visser, PA-C    Other Instructions N/A  

## 2019-12-11 NOTE — Progress Notes (Signed)
Cardiology Office Note   Date:  12/11/2019   ID:  Zachary Green, DOB 02/02/1936, MRN [id number]  PCP:  Zachary Body, MD  Cardiologist:   Zachary Sacramento, MD   Chief Complaint  Patient presents with  . other    12 month follow up. Meds reviewed by the pt. verbally. Pt. c/o occas. fluttering in chest since he last office visit here.       History of Present Illness: Zachary Green is a 84 y.o. male who presents for a follow-up visit regarding persistent atrial fibrillation maintained in sinus rhythm on amiodarone. He has history of BPH and prostate cancer treated with radiation therapy . He had previous cardioversion and has been maintaining in sinus rhythm with small dose amiodarone. He has chronic medical conditions that include hypertension and hyperlipidemia. He is not diabetic. He smoked for 5 years in his 79s. Family history is remarkable for atrial fibrillation but no premature coronary artery disease. Nuclear stress test in 2017 was normal. Echocardiogram in 2017 showed normal ejection fraction, moderate mitral and tricuspid regurgitation and moderate pulmonary hypertension. He was treated with small dose furosemide and has been stable.  He has been doing well with no recent chest pain or shortness of breath.  He reports intermittent palpitations but typically episodes do not last for more than few minutes.  No prolonged tachycardia.  No side effects with anticoagulation.  Past Medical History:  Diagnosis Date  . Actinic keratosis   . Arthritis   . Basal cell carcinoma 12/24/2013   Left sup. lat. chest. Nodular pattern. Tx: EDC  . Basal cell carcinoma 12/23/2014   Right lateral top of shoulder. Nodular pattern. Tx: EDC  . Basal cell carcinoma 01/05/2016   Right ant. shoulder infraclavicular. Nodular pattern. Tx: EDC  . BPH (benign prostatic hypertrophy)   . Chronic diastolic CHF (congestive heart failure) ([place])    a. echo 12/17: 55-60%, no RWMA, trivial AI,  moderate MR with systolic bowing without prolapse, moderate TR, mild biatrial enlargement, PASP 50 mmHg  . Diverticulitis   . ED (erectile dysfunction)   . GERD (gastroesophageal reflux disease)   . Hyperlipidemia   . Hypertension   . Impaired fasting glucose   . Persistent atrial fibrillation (HCC)    a. on Eliquis, amiodarone, Toprol; b. s/p successful dccv 03/28/16; c. CHADS2VASc at least 3 (HTN, age x 2)  . Personal history of colonic adenoma 05/23/2007   04/2007 - 6 mm adenoma  . Prostate cancer ([place])   . Rosacea   . Schatzki's ring    grade 1 varices    Past Surgical History:  Procedure Laterality Date  . COLONOSCOPY    . ELECTROPHYSIOLOGIC STUDY N/A 03/28/2016   Procedure: CARDIOVERSION;  Surgeon: Zachary Merritts, MD;  Location: [facility] ORS;  Service: Cardiovascular;  Laterality: N/A;  . ESOPHAGOGASTRODUODENOSCOPY    . ESOPHAGOGASTRODUODENOSCOPY    . ESOPHAGOGASTRODUODENOSCOPY (EGD) WITH PROPOFOL N/A 08/18/2016   Procedure: ESOPHAGOGASTRODUODENOSCOPY (EGD) WITH PROPOFOL;  Surgeon: Zachary Sails, MD;  Location: [facility] ENDOSCOPY;  Service: Endoscopy;  Laterality: N/A;  . GREEN LIGHT LASER TURP (TRANSURETHRAL RESECTION OF PROSTATE N/A 08/17/2015   Procedure: GREEN LIGHT LASER TURP (TRANSURETHRAL RESECTION OF PROSTATE;  Surgeon: Zachary Cowper, MD;  Location: [facility] ORS;  Service: Urology;  Laterality: N/A;  . NM MYOVIEW LTD    . PATELLA FRACTURE SURGERY     pin placed 1984  . REFRACTIVE SURGERY    . UPPER ESOPHAGEAL ENDOSCOPIC ULTRASOUND (EUS) N/A 09/21/2016  Procedure: UPPER ESOPHAGEAL ENDOSCOPIC ULTRASOUND (EUS);  Surgeon: Zachary Cliche, MD;  Location: [facility] ENDOSCOPY;  Service: Gastroenterology;  Laterality: N/A;     Current Outpatient Medications  Medication Sig Dispense Refill  . amiodarone (PACERONE) 100 MG tablet TAKE 1 TABLET (100 MG TOTAL) BY MOUTH DAILY. 90 tablet 3  . diclofenac Sodium (VOLTAREN) 1 % GEL Apply topically.    . docusate sodium (COLACE) 100 MG capsule  Take 2 capsules (200 mg total) by mouth 2 (two) times daily. (Patient taking differently: Take 100 mg by mouth daily. ) 120 capsule 3  . doxycycline (VIBRAMYCIN) 50 MG capsule 1 CAPSULE BY MOUTH DAILY TAKE WITH FOOD AND PLENTY OF FLUID 30 capsule 11  . ELIQUIS 5 MG TABS tablet TAKE 1 TABLET BY MOUTH TWICE A DAY 60 tablet 6  . esomeprazole (NEXIUM) 20 MG capsule Take 20 mg by mouth daily at 12 noon.     . furosemide (LASIX) 20 MG tablet TAKE 1 TABLET BY MOUTH EVERY DAY 90 tablet 3  . losartan (COZAAR) 100 MG tablet TAKE 1 TABLET BY MOUTH EVERY DAY IN THE MORNING 90 tablet 3  . metoprolol succinate (TOPROL-XL) 25 MG 24 hr tablet TAKE 0.5 TABLETS (12.5 MG TOTAL) BY MOUTH DAILY. 45 tablet 1  . metroNIDAZOLE (METROGEL) 1 % gel APP 1 GRAM TOPICALLY ON THE SKIN HS    . Multiple Vitamin (MULTI-VITAMINS) TABS Take 1 tablet by mouth daily.     . mupirocin ointment (BACTROBAN) 2 % Apply to aa's wounds QD until healed 22 g 3  . Omega-3 Fatty Acids (FISH OIL PO) Take 1 capsule by mouth 2 (two) times daily.    . permethrin (ELIMITE) 5 % cream Apply 1 application topically 2 (two) times daily.     Zachary Green Glycol-Propyl Glycol (SYSTANE OP) Place 1 drop into both eyes daily as needed (dry eyes).    . pravastatin (PRAVACHOL) 40 MG tablet TAKE ONE TABLET BY MOUTH EVERY DAY (Patient taking differently: TAKE ONE TABLET BY MOUTH EVERY EVENING) 90 tablet 3  . tamsulosin (FLOMAX) 0.4 MG CAPS capsule Take 1 capsule (0.4 mg total) by mouth daily after supper. 30 capsule 5   No current facility-administered medications for this visit.    Allergies:   Codeine    Social History:  The patient  reports that he quit smoking about 60 years ago. His smoking use included cigarettes. He has a 7.00 pack-year smoking history. He has never used smokeless tobacco. He reports that he does not drink alcohol and does not use drugs.   Family History:  The patient's family history includes Alzheimer's disease in his mother; Cancer  in his mother; Diabetes in his brother.    ROS:  Please see the history of present illness.   Otherwise, review of systems are positive for none.   All other systems are reviewed and negative.    PHYSICAL EXAM: VS:  BP 140/64 (BP Location: Left Arm, Patient Position: Sitting, Cuff Size: Normal)   Pulse 71   Ht 5\' 9"  (1.753 m)   Wt 212 lb 2 oz (96.2 kg)   SpO2 98%   BMI 31.33 kg/m  , BMI Green mass index is 31.33 kg/m. GEN: Well nourished, well developed, in no acute distress  HEENT: normal  Neck: no JVD, carotid bruits, or masses Cardiac: RRR; no murmurs, rubs, or gallops,no edema  Respiratory:  clear to auscultation bilaterally, normal work of breathing GI: soft, nontender, nondistended, + BS MS: no deformity or atrophy  Skin: warm and dry, no rash Neuro:  Strength and sensation are intact Psych: euthymic mood, full affect   EKG:  EKG is ordered today. The ekg ordered today demonstrates sinus rhythm with PACs.  No significant ST or T wave changes.   Recent Labs: No results found for requested labs within last 8760 hours.    Lipid Panel    Component Value Date/Time   CHOL 181 11/22/2010 1012   TRIG 82.0 11/22/2010 1012   HDL 54.40 11/22/2010 1012   CHOLHDL 3 11/22/2010 1012   VLDL 16.4 11/22/2010 1012   LDLCALC 110 (H) 11/22/2010 1012   LDLDIRECT 126.2 05/01/2007 0954      Wt Readings from Last 3 Encounters:  12/11/19 212 lb 2 oz (96.2 kg)  12/13/18 205 lb 12 oz (93.3 kg)  12/18/17 201 lb (91.2 kg)       PAD Screen 03/09/2016  Previous PAD dx? No  Previous surgical procedure? No  Pain with walking? No  Feet/toe relief with dangling? No  Painful, non-healing ulcers? No  Extremities discolored? No      ASSESSMENT AND PLAN:  1.  Persistent atrial fibrillation: He continues to maintain in sinus rhythm with small dose amiodarone 100 mg once daily.   He is tolerating anticoagulation with Eliquis with no bleeding complications.  I reviewed most recent  labs done in August which showed stable mild anemia with hemoglobin of 13.8.  Renal function was normal with creatinine of 1.1.  Liver function tests were normal.  2. Essential hypertension: Blood pressure is well controlled now on current medications.  3. Aortic atherosclerosis: The patient has normal pulses and no evidence of obstructive peripheral arterial disease.  4. Hyperlipidemia: Currently on pravastatin.  Most recent lipid profile in July showed an LDL of 109.  5. Pulmonary nodules: Followed by primary care physician.  6. Chronic diastolic heart failure with pulmonary hypertension: He appears to be euvolemic small dose furosemide.   Disposition:   FU with me in 12 months  Signed,  Zachary Sacramento, MD  12/11/2019 11:47 AM    [place]

## 2019-12-28 ENCOUNTER — Other Ambulatory Visit: Payer: Self-pay | Admitting: Physician Assistant

## 2020-01-08 ENCOUNTER — Other Ambulatory Visit: Payer: Self-pay | Admitting: Dermatology

## 2020-01-21 ENCOUNTER — Other Ambulatory Visit: Payer: Self-pay | Admitting: Cardiovascular Disease

## 2020-01-24 ENCOUNTER — Other Ambulatory Visit: Payer: Self-pay | Admitting: Cardiovascular Disease

## 2020-01-26 NOTE — Telephone Encounter (Signed)
Refil request.

## 2020-01-26 NOTE — Telephone Encounter (Signed)
Pt's age 84, wt 96.2 kg, SCr 1.1, CrCl 70.45, last ov w/ MA 12/11/19.

## 2020-05-06 ENCOUNTER — Telehealth: Payer: Self-pay | Admitting: Cardiovascular Disease

## 2020-05-06 NOTE — Telephone Encounter (Signed)
Patient states he went to see his PCP this morning and was told he was in afib. Patient c/o Palpitations:  High priority if patient c/o lightheadedness, shortness of breath, or chest pain  1) How long have you had palpitations/irregular HR/ Afib? Are you having the symptoms now? yes  2) Are you currently experiencing lightheadedness, SOB or CP? Yes,lightheadedness and SOB  3) Do you have a history of afib (atrial fibrillation) or irregular heart rhythm? yes  Have you checked your BP or HR? (document readings if available): 131/77 HR 89  4) Are you experiencing any other symptoms? Lightheadedness, fatigue, no energy

## 2020-05-06 NOTE — Telephone Encounter (Signed)
Spoke the patient. Patient sts that he was seen by his pcp today and was told he was in AFIB. Pt sts that he was told by Dr. Netty Starring to f/u with Dr Fletcher Anon asap.  Pt has noted that over the last several weeks he has episodes of sob, weakness and fatigue with exertion.  He is currently taking his Eliquis 5 mg bid, Amiodarone 100 mg qd, and Metoprolol 12.5mg  daily. His BP this morning 158/88 106bpm. Patient is currently asymptomatic.  Appt scheduled w/ Dr. Fletcher Anon for 05/11/20 @ 4:40pm. Patient adv to contact the office sooner if symptoms worsen.  Patient agreeable with the plan,  verbalized understanding and voiced appreciation for the call back.

## 2020-05-10 ENCOUNTER — Other Ambulatory Visit: Payer: Self-pay

## 2020-05-10 ENCOUNTER — Encounter: Payer: Self-pay | Admitting: Dermatology

## 2020-05-10 ENCOUNTER — Ambulatory Visit: Payer: Medicare HMO | Admitting: Dermatology

## 2020-05-10 DIAGNOSIS — L578 Other skin changes due to chronic exposure to nonionizing radiation: Secondary | ICD-10-CM

## 2020-05-10 DIAGNOSIS — L570 Actinic keratosis: Secondary | ICD-10-CM | POA: Diagnosis not present

## 2020-05-10 DIAGNOSIS — L820 Inflamed seborrheic keratosis: Secondary | ICD-10-CM | POA: Diagnosis not present

## 2020-05-10 NOTE — Patient Instructions (Signed)
Cryotherapy Aftercare  . Wash gently with soap and water everyday.   . Apply Vaseline and Band-Aid daily until healed.  

## 2020-05-10 NOTE — Progress Notes (Signed)
   Follow-Up Visit   Subjective  Zachary Green is a 85 y.o. male who presents for the following: Follow-up (Patient here today for 6 month rosacea, ISK and AK follow up. AK at right ear treated with LN2, ISK's at arms and legs treated with LN2 at last visit. ).  Patient using metronidazole alternating with elimite for rosacea.   The following portions of the chart were reviewed this encounter and updated as appropriate:   Tobacco  Allergies  Meds  Problems  Med Hx  Surg Hx  Fam Hx     Review of Systems:  No other skin or systemic complaints except as noted in HPI or Assessment and Plan.  Objective  Well appearing patient in no apparent distress; mood and affect are within normal limits.  A focused examination was performed including face, arms, legs, hands. Relevant physical exam findings are noted in the Assessment and Plan.  Objective  scalp x 2, arms and hands x 15, left pretibial x 1, left lateral ankle x 1, left post thigh x 1 (20): Erythematous keratotic or waxy stuck-on papule or plaque.   Objective  face x 6, right ear x 1 (7): Erythematous thin papules/macules with gritty scale.    Assessment & Plan  Inflamed seborrheic keratosis (20) scalp x 2, arms and hands x 15, left pretibial x 1, left lateral ankle x 1, left post thigh x 1  Recheck left post thigh, left lat ankle, left pretibial on follow up  Destruction of lesion - scalp x 2, arms and hands x 15, left pretibial x 1, left lateral ankle x 1, left post thigh x 1 Complexity: simple   Destruction method: cryotherapy   Informed consent: discussed and consent obtained   Timeout:  patient name, date of birth, surgical site, and procedure verified Lesion destroyed using liquid nitrogen: Yes   Region frozen until ice ball extended beyond lesion: Yes   Outcome: patient tolerated procedure well with no complications   Post-procedure details: wound care instructions given    Other Related Medications mupirocin  ointment (BACTROBAN) 2 %  AK (actinic keratosis) (7) face x 6, right ear x 1  Destruction of lesion - face x 6, right ear x 1 Complexity: simple   Destruction method: cryotherapy   Informed consent: discussed and consent obtained   Timeout:  patient name, date of birth, surgical site, and procedure verified Lesion destroyed using liquid nitrogen: Yes   Region frozen until ice ball extended beyond lesion: Yes   Outcome: patient tolerated procedure well with no complications   Post-procedure details: wound care instructions given    Actinic Damage - chronic, secondary to cumulative UV radiation exposure/sun exposure over time - diffuse scaly erythematous macules with underlying dyspigmentation - Recommend daily broad spectrum sunscreen SPF 30+ to sun-exposed areas, reapply every 2 hours as needed.  - Call for new or changing lesions.  Return in about 2 months (around 07/08/2020) for ISK, AK follow up.  Graciella Belton, RMA, am acting as scribe for Sarina Ser, MD . Documentation: I have reviewed the above documentation for accuracy and completeness, and I agree with the above.  Sarina Ser, MD

## 2020-05-11 ENCOUNTER — Ambulatory Visit: Payer: Medicare HMO | Admitting: Cardiovascular Disease

## 2020-05-11 ENCOUNTER — Encounter: Payer: Self-pay | Admitting: Cardiovascular Disease

## 2020-05-11 VITALS — BP 148/92 | HR 100 | Ht 69.0 in | Wt 213.0 lb

## 2020-05-11 DIAGNOSIS — R918 Other nonspecific abnormal finding of lung field: Secondary | ICD-10-CM

## 2020-05-11 DIAGNOSIS — I10 Essential (primary) hypertension: Secondary | ICD-10-CM | POA: Diagnosis not present

## 2020-05-11 DIAGNOSIS — I4819 Other persistent atrial fibrillation: Secondary | ICD-10-CM | POA: Diagnosis not present

## 2020-05-11 DIAGNOSIS — I700 Atherosclerosis of aorta: Secondary | ICD-10-CM | POA: Diagnosis not present

## 2020-05-11 DIAGNOSIS — I272 Pulmonary hypertension, unspecified: Secondary | ICD-10-CM

## 2020-05-11 DIAGNOSIS — E785 Hyperlipidemia, unspecified: Secondary | ICD-10-CM

## 2020-05-11 DIAGNOSIS — I5032 Chronic diastolic (congestive) heart failure: Secondary | ICD-10-CM

## 2020-05-11 MED ORDER — AMIODARONE HCL 200 MG PO TABS: ORAL_TABLET | ORAL | 0 refills | Status: DC

## 2020-05-11 MED ORDER — METOPROLOL SUCCINATE ER 25 MG PO TB24: 25.0000 mg | ORAL_TABLET | Freq: Every day | ORAL | 1 refills | Status: DC

## 2020-05-11 NOTE — Patient Instructions (Signed)
Medication Instructions:  Your physician has recommended you make the following change in your medication:   1) INCREASE Amiodarone to 200 mg twice a day for 7 days. Then REDUCE to 200 mg daily. An Rx has been sent to your pharmacy.  2) INCREASE Metoprolol to 25 mg daily. An Rx has been sent to your pharmacy.   *If you need a refill on your cardiac medications before your next appointment, please call your pharmacy*   Lab Work: None ordered If you have labs (blood work) drawn today and your tests are completely normal, you will receive your results only by: Marland Kitchen MyChart Message (if you have MyChart) OR . A paper copy in the mail If you have any lab test that is abnormal or we need to change your treatment, we will call you to review the results.   Testing/Procedures: Your physician has requested that you have an echocardiogram. Echocardiography is a painless test that uses sound waves to create images of your heart. It provides your doctor with information about the size and shape of your heart and how well your heart's chambers and valves are working. This procedure takes approximately one hour. There are no restrictions for this procedure. (To be scheduled prior to the next appt)    Follow-Up: At [facility], you and your health needs are our priority.  As part of our continuing mission to provide you with exceptional heart care, we have created designated Provider Care Teams.  These Care Teams include your primary Cardiologist (physician) and Advanced Practice Providers (APPs -  Physician Assistants and Nurse Practitioners) who all work together to provide you with the care you need, when you need it.  We recommend signing up for the patient portal called "MyChart".  Sign up information is provided on this After Visit Summary.  MyChart is used to connect with patients for Virtual Visits (Telemedicine).  Patients are able to view lab/test results, encounter notes, upcoming appointments,  etc.  Non-urgent messages can be sent to your provider as well.   To learn more about what you can do with MyChart, go to [email or web address].    Your next appointment:   3 week(s)  The format for your next appointment:   In Person  Provider:   You may see  [name], MD or one of the following Advanced Practice Providers on your designated Care Team:    Murray Hodgkins, NP  [name], PA-C  Marrianne Mood, PA-C  Cadence [place], [place]  Laurann [place], NP    Other Instructions N/A

## 2020-05-11 NOTE — Progress Notes (Signed)
Cardiology Office Note   Date:  05/11/2020   ID:  Zachary Green, DOB [birth year], MRN [id number]  PCP:  [name], MD  Cardiologist:   [name], MD   Chief Complaint  Patient presents with  . Follow-up    Increased AFIB episodes per telephone note on 05/06/20       History of Present Illness: Zachary Green is a 85 y.o. male who presents for a follow-up visit regarding persistent atrial fibrillation maintained in sinus rhythm on amiodarone. He has history of BPH and prostate cancer treated with radiation therapy . He had previous cardioversion and has been maintaining in sinus rhythm with small dose amiodarone. He has chronic medical conditions that include hypertension and hyperlipidemia. He is not diabetic. He smoked for 5 years in his 2s. Family history is remarkable for atrial fibrillation but no premature coronary artery disease. Nuclear stress test in 2017 was normal. Echocardiogram in 2017 showed normal ejection fraction, moderate mitral and tricuspid regurgitation and moderate pulmonary hypertension. He was treated with small dose furosemide and has been stable.  He was seen most recently in September and was maintaining in sinus rhythm.  He did report intermittent palpitations but was not lasting long.  Since January, he noticed recurrent palpitations with dizziness, fatigue and increased shortness of breath.  He had alerted by his blood pressure machine that he was probably in atrial fibrillation.  No chest pain.  He has been taking his medications regularly.  He had labs done with his primary care physician recently that overall were unremarkable including CBC and CMP.  Past Medical History:  Diagnosis Date  . Actinic keratosis   . Arthritis   . Basal cell carcinoma 12/24/2013   Left sup. lat. chest. Nodular pattern. Tx: EDC  . Basal cell carcinoma 12/23/2014   Right lateral top of shoulder. Nodular pattern. Tx: EDC  . Basal cell carcinoma 01/05/2016    Right ant. shoulder infraclavicular. Nodular pattern. Tx: EDC  . BPH (benign prostatic hypertrophy)   . Chronic diastolic CHF (congestive heart failure) ([place])    a. echo 12/17: 55-60%, no RWMA, trivial AI, moderate MR with systolic bowing without prolapse, moderate TR, mild biatrial enlargement, PASP 50 mmHg  . Diverticulitis   . ED (erectile dysfunction)   . GERD (gastroesophageal reflux disease)   . Hyperlipidemia   . Hypertension   . Impaired fasting glucose   . Persistent atrial fibrillation (HCC)    a. on Eliquis, amiodarone, Toprol; b. s/p successful dccv 03/28/16; c. CHADS2VASc at least 3 (HTN, age x 2)  . Personal history of colonic adenoma 05/23/2007   04/2007 - 6 mm adenoma  . Prostate cancer ([place])   . Rosacea   . Schatzki's ring    grade 1 varices    Past Surgical History:  Procedure Laterality Date  . COLONOSCOPY    . ELECTROPHYSIOLOGIC STUDY N/A 03/28/2016   Procedure: CARDIOVERSION;  Surgeon: Minna Merritts, MD;  Location: [facility] ORS;  Service: Cardiovascular;  Laterality: N/A;  . ESOPHAGOGASTRODUODENOSCOPY    . ESOPHAGOGASTRODUODENOSCOPY    . ESOPHAGOGASTRODUODENOSCOPY (EGD) WITH PROPOFOL N/A 08/18/2016   Procedure: ESOPHAGOGASTRODUODENOSCOPY (EGD) WITH PROPOFOL;  Surgeon: Lollie Sails, MD;  Location: [facility] ENDOSCOPY;  Service: Endoscopy;  Laterality: N/A;  . GREEN LIGHT LASER TURP (TRANSURETHRAL RESECTION OF PROSTATE N/A 08/17/2015   Procedure: GREEN LIGHT LASER TURP (TRANSURETHRAL RESECTION OF PROSTATE;  Surgeon: Royston Cowper, MD;  Location: [facility] ORS;  Service: Urology;  Laterality: N/A;  . NM  MYOVIEW LTD    . PATELLA FRACTURE SURGERY     pin placed 1984  . REFRACTIVE SURGERY    . UPPER ESOPHAGEAL ENDOSCOPIC ULTRASOUND (EUS) N/A 09/21/2016   Procedure: UPPER ESOPHAGEAL ENDOSCOPIC ULTRASOUND (EUS);  Surgeon: Reita Cliche, MD;  Location: [facility] ENDOSCOPY;  Service: Gastroenterology;  Laterality: N/A;     Current Outpatient Medications  Medication Sig  Dispense Refill  . amiodarone (PACERONE) 100 MG tablet TAKE 1 TABLET (100 MG TOTAL) BY MOUTH DAILY. 90 tablet 3  . diclofenac Sodium (VOLTAREN) 1 % GEL Apply topically.    . docusate sodium (COLACE) 100 MG capsule Take 2 capsules (200 mg total) by mouth 2 (two) times daily. (Patient taking differently: Take 100 mg by mouth daily.) 120 capsule 3  . doxycycline (VIBRAMYCIN) 50 MG capsule 1 CAPSULE BY MOUTH DAILY TAKE WITH FOOD AND PLENTY OF FLUID 30 capsule 11  . ELIQUIS 5 MG TABS tablet TAKE 1 TABLET BY MOUTH TWICE A DAY 60 tablet 6  . esomeprazole (NEXIUM) 20 MG capsule Take 20 mg by mouth daily at 12 noon.     . furosemide (LASIX) 20 MG tablet TAKE 1 TABLET BY MOUTH EVERY DAY 90 tablet 3  . losartan (COZAAR) 100 MG tablet TAKE 1 TABLET BY MOUTH EVERY DAY IN THE MORNING 90 tablet 3  . metoprolol succinate (TOPROL-XL) 25 MG 24 hr tablet TAKE 0.5 TABLETS (12.5 MG TOTAL) BY MOUTH DAILY. 45 tablet 3  . metroNIDAZOLE (METROGEL) 1 % gel APPLY 1 GRAM ON THE SKIN AT BEDTIME 45 g 11  . Multiple Vitamin (MULTI-VITAMINS) TABS Take 1 tablet by mouth daily.     . mupirocin ointment (BACTROBAN) 2 % Apply to aa's wounds QD until healed 22 g 3  . Omega-3 Fatty Acids (FISH OIL PO) Take 1 capsule by mouth 2 (two) times daily.    . permethrin (ELIMITE) 5 % cream Apply 1 application topically 2 (two) times daily.     Zachary Green (SYSTANE OP) Place 1 drop into both eyes daily as needed (dry eyes).    . pravastatin (PRAVACHOL) 40 MG tablet TAKE ONE TABLET BY MOUTH EVERY DAY 90 tablet 3  . tamsulosin (FLOMAX) 0.4 MG CAPS capsule Take 1 capsule (0.4 mg total) by mouth daily after supper. 30 capsule 5   No current facility-administered medications for this visit.    Allergies:   Codeine    Social History:  The patient  reports that he quit smoking about 61 years ago. His smoking use included cigarettes. He has a 7.00 pack-year smoking history. He has never used smokeless tobacco. He reports that  he does not drink alcohol and does not use drugs.   Family History:  The patient's family history includes Alzheimer's disease in his mother; Cancer in his mother; Diabetes in his brother.    ROS:  Please see the history of present illness.   Otherwise, review of systems are positive for none.   All other systems are reviewed and negative.    PHYSICAL EXAM: VS:  BP (!) 148/92   Pulse 100   Ht 5\' 9"  (1.753 m)   Wt 213 lb (96.6 kg)   BMI 31.45 kg/m  , BMI Body mass index is 31.45 kg/m. GEN: Well nourished, well developed, in no acute distress  HEENT: normal  Neck: no JVD, carotid bruits, or masses Cardiac: Irregularly irregular; no murmurs, rubs, or gallops,no edema  Respiratory:  clear to auscultation bilaterally, normal work of breathing GI: soft, nontender,  nondistended, + BS MS: no deformity or atrophy  Skin: warm and dry, no rash Neuro:  Strength and sensation are intact Psych: euthymic mood, full affect   EKG:  EKG is ordered today. The ekg ordered today demonstrates atrial fibrillation with a heart rate of 100 bpm.  No significant ST or T wave changes.   Recent Labs: No results found for requested labs within last 8760 hours.    Lipid Panel    Component Value Date/Time   CHOL 181 11/22/2010 1012   TRIG 82.0 11/22/2010 1012   HDL 54.40 11/22/2010 1012   CHOLHDL 3 11/22/2010 1012   VLDL 16.4 11/22/2010 1012   LDLCALC 110 (H) 11/22/2010 1012   LDLDIRECT 126.2 05/01/2007 0954      Wt Readings from Last 3 Encounters:  05/11/20 213 lb (96.6 kg)  12/11/19 212 lb 2 oz (96.2 kg)  12/13/18 205 lb 12 oz (93.3 kg)       PAD Screen 03/09/2016  Previous PAD dx? No  Previous surgical procedure? No  Pain with walking? No  Feet/toe relief with dangling? No  Painful, non-healing ulcers? No  Extremities discolored? No      ASSESSMENT AND PLAN:  1.  Persistent atrial fibrillation: It seems that he has been in atrial fibrillation over the last 1 to 2 months and  he seems to be highly symptomatic.  I am going to increase amiodarone to 200 mg twice daily for 1 week and then 200 mg once daily.  It seems that the 100 mg once daily dose is not effective anymore.  In addition, increase Toprol to 25 mg daily.  I am going to obtain an echocardiogram.  Continue anticoagulation with Eliquis.  If he does not convert sinus rhythm chemically, we will plan on proceeding with cardioversion in few weeks.  2. Essential hypertension: Blood pressure is mildly elevated.  I increased metoprolol.  3. Aortic atherosclerosis: The patient has normal pulses and no evidence of obstructive peripheral arterial disease.  4. Hyperlipidemia: Currently on pravastatin.  Most recent lipid profile in July showed an LDL of 109.  5. Pulmonary nodules: Followed by primary care physician.  6. Chronic diastolic heart failure with pulmonary hypertension: Continue small dose furosemide.  I am going to repeat his echocardiogram given recurrent atrial fibrillation and previous moderate mitral and tricuspid regurgitation and moderate pulmonary hypertension.   Disposition:   FU  in 3 to 4 weeks.  Signed,  [name], MD  05/11/2020 4:45 PM    [facility]

## 2020-05-27 ENCOUNTER — Ambulatory Visit (INDEPENDENT_AMBULATORY_CARE_PROVIDER_SITE_OTHER): Payer: Medicare HMO

## 2020-05-27 ENCOUNTER — Other Ambulatory Visit: Payer: Self-pay

## 2020-05-27 DIAGNOSIS — I4819 Other persistent atrial fibrillation: Secondary | ICD-10-CM | POA: Diagnosis not present

## 2020-05-27 LAB — ECHOCARDIOGRAM COMPLETE
Area-P 1/2: 5.31 cm2
P 1/2 time: 460 msec
S' Lateral: 2.1 cm

## 2020-06-01 ENCOUNTER — Other Ambulatory Visit: Payer: Self-pay | Admitting: Cardiovascular Disease

## 2020-06-03 ENCOUNTER — Ambulatory Visit: Payer: Medicare HMO | Admitting: Cardiovascular Disease

## 2020-06-03 ENCOUNTER — Other Ambulatory Visit: Payer: Self-pay

## 2020-06-03 ENCOUNTER — Encounter: Payer: Self-pay | Admitting: Cardiovascular Disease

## 2020-06-03 DIAGNOSIS — I700 Atherosclerosis of aorta: Secondary | ICD-10-CM | POA: Diagnosis not present

## 2020-06-03 DIAGNOSIS — E785 Hyperlipidemia, unspecified: Secondary | ICD-10-CM

## 2020-06-03 DIAGNOSIS — I10 Essential (primary) hypertension: Secondary | ICD-10-CM | POA: Diagnosis not present

## 2020-06-03 DIAGNOSIS — R918 Other nonspecific abnormal finding of lung field: Secondary | ICD-10-CM

## 2020-06-03 DIAGNOSIS — I4819 Other persistent atrial fibrillation: Secondary | ICD-10-CM | POA: Diagnosis not present

## 2020-06-03 DIAGNOSIS — I5032 Chronic diastolic (congestive) heart failure: Secondary | ICD-10-CM

## 2020-06-03 NOTE — Progress Notes (Signed)
Cardiology Office Note   Date:  06/03/2020   ID:  Zachary Green, DOB 1935/11/07, MRN [id number]  PCP:  [name], MD  Cardiologist:   [name], MD   Chief Complaint  Patient presents with  . Follow-up    3 weeks      History of Present Illness: Zachary Green is a 85 y.o. male who presents for a follow-up visit regarding persistent atrial fibrillation maintained in sinus rhythm on amiodarone. He has history of BPH and prostate cancer treated with radiation therapy . He had previous cardioversion and has been maintaining in sinus rhythm with small dose amiodarone. He has chronic medical conditions that include hypertension and hyperlipidemia. He is not diabetic. He smoked for 5 years in his 1s. Family history is remarkable for atrial fibrillation but no premature coronary artery disease. Nuclear stress test in 2017 was normal. Echocardiogram in 2017 showed normal ejection fraction, moderate mitral and tricuspid regurgitation and moderate pulmonary hypertension. He was treated with small dose furosemide and has been stable.  He was seen last month for recurrent palpitations, dizziness, fatigue and increased shortness of breath.  His symptoms started in January.  He was noted to be in atrial fibrillation with ventricular rate 100 bpm.  He was on amiodarone 100 mg once daily and thus I reloaded him with amiodarone and increase the maintenance dose to 200 mg once daily.  In addition, I increase Toprol to 25 mg once daily.  An echocardiogram was done which showed normal LV systolic function, moderately dilated left atrium and mild mitral regurgitation.  He continues to have exertional dyspnea and fatigue.  No chest pain.  Past Medical History:  Diagnosis Date  . Actinic keratosis   . Arthritis   . Basal cell carcinoma 12/24/2013   Left sup. lat. chest. Nodular pattern. Tx: EDC  . Basal cell carcinoma 12/23/2014   Right lateral top of shoulder. Nodular pattern.  Tx: EDC  . Basal cell carcinoma 01/05/2016   Right ant. shoulder infraclavicular. Nodular pattern. Tx: EDC  . BPH (benign prostatic hypertrophy)   . Chronic diastolic CHF (congestive heart failure) ([place])    a. echo 12/17: 55-60%, no RWMA, trivial AI, moderate MR with systolic bowing without prolapse, moderate TR, mild biatrial enlargement, PASP 50 mmHg  . Diverticulitis   . ED (erectile dysfunction)   . GERD (gastroesophageal reflux disease)   . Hyperlipidemia   . Hypertension   . Impaired fasting glucose   . Persistent atrial fibrillation (HCC)    a. on Eliquis, amiodarone, Toprol; b. s/p successful dccv 03/28/16; c. CHADS2VASc at least 3 (HTN, age x 2)  . Personal history of colonic adenoma 05/23/2007   04/2007 - 6 mm adenoma  . Prostate cancer ([place])   . Rosacea   . Schatzki's ring    grade 1 varices    Past Surgical History:  Procedure Laterality Date  . COLONOSCOPY    . ELECTROPHYSIOLOGIC STUDY N/A 03/28/2016   Procedure: CARDIOVERSION;  Surgeon: Minna Merritts, MD;  Location: [facility] ORS;  Service: Cardiovascular;  Laterality: N/A;  . ESOPHAGOGASTRODUODENOSCOPY    . ESOPHAGOGASTRODUODENOSCOPY    . ESOPHAGOGASTRODUODENOSCOPY (EGD) WITH PROPOFOL N/A 08/18/2016   Procedure: ESOPHAGOGASTRODUODENOSCOPY (EGD) WITH PROPOFOL;  Surgeon: Lollie Sails, MD;  Location: [facility] ENDOSCOPY;  Service: Endoscopy;  Laterality: N/A;  . GREEN LIGHT LASER TURP (TRANSURETHRAL RESECTION OF PROSTATE N/A 08/17/2015   Procedure: GREEN LIGHT LASER TURP (TRANSURETHRAL RESECTION OF PROSTATE;  Surgeon: Royston Cowper, MD;  Location:  [facility] ORS;  Service: Urology;  Laterality: N/A;  . NM MYOVIEW LTD    . PATELLA FRACTURE SURGERY     pin placed 1984  . REFRACTIVE SURGERY    . UPPER ESOPHAGEAL ENDOSCOPIC ULTRASOUND (EUS) N/A 09/21/2016   Procedure: UPPER ESOPHAGEAL ENDOSCOPIC ULTRASOUND (EUS);  Surgeon: Reita Cliche, MD;  Location: [facility] ENDOSCOPY;  Service: Gastroenterology;  Laterality: N/A;     Current  Outpatient Medications  Medication Sig Dispense Refill  . amiodarone (PACERONE) 200 MG tablet TAKE 1 TABLET ONCE DAILY 30 tablet 0  . diclofenac Sodium (VOLTAREN) 1 % GEL Apply topically.    . docusate sodium (COLACE) 100 MG capsule Take 100 mg by mouth daily.    Marland Kitchen doxycycline (VIBRAMYCIN) 50 MG capsule 1 CAPSULE BY MOUTH DAILY TAKE WITH FOOD AND PLENTY OF FLUID 30 capsule 11  . ELIQUIS 5 MG TABS tablet TAKE 1 TABLET BY MOUTH TWICE A DAY 60 tablet 6  . esomeprazole (NEXIUM) 20 MG capsule Take 20 mg by mouth daily at 12 noon.     . furosemide (LASIX) 20 MG tablet TAKE 1 TABLET BY MOUTH EVERY DAY 90 tablet 3  . losartan (COZAAR) 100 MG tablet TAKE 1 TABLET BY MOUTH EVERY DAY IN THE MORNING 90 tablet 3  . metoprolol succinate (TOPROL-XL) 25 MG 24 hr tablet Take 1 tablet (25 mg total) by mouth daily. 90 tablet 1  . metroNIDAZOLE (METROGEL) 1 % gel APPLY 1 GRAM ON THE SKIN AT BEDTIME 45 g 11  . Multiple Vitamin (MULTI-VITAMINS) TABS Take 1 tablet by mouth daily.     . mupirocin ointment (BACTROBAN) 2 % Apply to aa's wounds QD until healed 22 g 3  . Omega-3 Fatty Acids (FISH OIL PO) Take 1 capsule by mouth 2 (two) times daily.    . permethrin (ELIMITE) 5 % cream Apply 1 application topically 2 (two) times daily.     [name] Glycol-Propyl Glycol (SYSTANE OP) Place 1 drop into both eyes daily as needed (dry eyes).    . pravastatin (PRAVACHOL) 40 MG tablet TAKE ONE TABLET BY MOUTH EVERY DAY 90 tablet 3  . tamsulosin (FLOMAX) 0.4 MG CAPS capsule Take 1 capsule (0.4 mg total) by mouth daily after supper. 30 capsule 5   No current facility-administered medications for this visit.    Allergies:   Codeine    Social History:  The patient  reports that he quit smoking about 61 years ago. His smoking use included cigarettes. He has a 7.00 pack-year smoking history. He has never used smokeless tobacco. He reports that he does not drink alcohol and does not use drugs.   Family History:  The patient's  family history includes Alzheimer's disease in his mother; Cancer in his mother; Diabetes in his brother.    ROS:  Please see the history of present illness.   Otherwise, review of systems are positive for none.   All other systems are reviewed and negative.    PHYSICAL EXAM: VS:  BP 116/72   Pulse 85   Ht 5\' 9"  (1.753 m)   Wt 213 lb (96.6 kg)   BMI 31.45 kg/m  , BMI Body mass index is 31.45 kg/m. GEN: Well nourished, well developed, in no acute distress  HEENT: normal  Neck: no JVD, carotid bruits, or masses Cardiac: Irregularly irregular; no murmurs, rubs, or gallops,no edema  Respiratory:  clear to auscultation bilaterally, normal work of breathing GI: soft, nontender, nondistended, + BS MS: no deformity or atrophy  Skin:  warm and dry, no rash Neuro:  Strength and sensation are intact Psych: euthymic mood, full affect   EKG:  EKG is ordered today. The ekg ordered today demonstrates atrial fibrillation with ventricular rate of 85 bpm.   Recent Labs: No results found for requested labs within last 8760 hours.    Lipid Panel    Component Value Date/Time   CHOL 181 11/22/2010 1012   TRIG 82.0 11/22/2010 1012   HDL 54.40 11/22/2010 1012   CHOLHDL 3 11/22/2010 1012   VLDL 16.4 11/22/2010 1012   LDLCALC 110 (H) 11/22/2010 1012   LDLDIRECT 126.2 05/01/2007 0954      Wt Readings from Last 3 Encounters:  06/03/20 213 lb (96.6 kg)  05/11/20 213 lb (96.6 kg)  12/11/19 212 lb 2 oz (96.2 kg)       PAD Screen 03/09/2016  Previous PAD dx? No  Previous surgical procedure? No  Pain with walking? No  Feet/toe relief with dangling? No  Painful, non-healing ulcers? No  Extremities discolored? No      ASSESSMENT AND PLAN:  1.  Persistent atrial fibrillation: Ventricular rate improved but he continues to be symptomatic.  Given continued symptoms, recommend proceeding with cardioversion.  I discussed the procedure in details as well as risk and benefits.  He has not  missed any dose of Eliquis.  2. Essential hypertension: Blood pressure is controlled on current medications.  3. Aortic atherosclerosis: The patient has normal pulses and no evidence of obstructive peripheral arterial disease.  4. Hyperlipidemia: Currently on pravastatin.  Most recent lipid profile in July showed an LDL of 109.  5. Pulmonary nodules: Followed by primary care physician.  6. Chronic diastolic heart failure with pulmonary hypertension: Continue small dose furosemide.  Recent echocardiogram showed normal LV systolic function.   Disposition:   FU  in few weeks after cardioversion.  Signed,  [name], MD  06/03/2020 9:43 AM    [facility]

## 2020-06-03 NOTE — H&P (View-Only) (Signed)
Cardiology Office Note   Date:  06/03/2020   ID:  Zachary Green, DOB 06-Mar-1936, MRN [id number]  PCP:  [name], MD  Cardiologist:   [name], MD   Chief Complaint  Patient presents with  . Follow-up    3 weeks      History of Present Illness: Zachary Green is a 85 y.o. male who presents for a follow-up visit regarding persistent atrial fibrillation maintained in sinus rhythm on amiodarone. He has history of BPH and prostate cancer treated with radiation therapy . He had previous cardioversion and has been maintaining in sinus rhythm with small dose amiodarone. He has chronic medical conditions that include hypertension and hyperlipidemia. He is not diabetic. He smoked for 5 years in his 29s. Family history is remarkable for atrial fibrillation but no premature coronary artery disease. Nuclear stress test in 2017 was normal. Echocardiogram in 2017 showed normal ejection fraction, moderate mitral and tricuspid regurgitation and moderate pulmonary hypertension. He was treated with small dose furosemide and has been stable.  He was seen last month for recurrent palpitations, dizziness, fatigue and increased shortness of breath.  His symptoms started in January.  He was noted to be in atrial fibrillation with ventricular rate 100 bpm.  He was on amiodarone 100 mg once daily and thus I reloaded him with amiodarone and increase the maintenance dose to 200 mg once daily.  In addition, I increase Toprol to 25 mg once daily.  An echocardiogram was done which showed normal LV systolic function, moderately dilated left atrium and mild mitral regurgitation.  He continues to have exertional dyspnea and fatigue.  No chest pain.  Past Medical History:  Diagnosis Date  . Actinic keratosis   . Arthritis   . Basal cell carcinoma 12/24/2013   Left sup. lat. chest. Nodular pattern. Tx: EDC  . Basal cell carcinoma 12/23/2014   Right lateral top of shoulder. Nodular pattern.  Tx: EDC  . Basal cell carcinoma 01/05/2016   Right ant. shoulder infraclavicular. Nodular pattern. Tx: ED[facility]  . BPH (benign prostatic hypertrophy)   . Chronic diastolic CHF (congestive heart failure) ([place])    a. echo 12/17: 55-60%, no RWMA, trivial AI, moderate MR with systolic bowing without prolapse, moderate TR, mild biatrial enlargement, PASP 50 mmHg  . Diverticulitis   . ED (erectile dysfunction)   . GERD (gastroesophageal reflux disease)   . Hyperlipidemia   . Hypertension   . Impaired fasting glucose   . Persistent atrial fibrillation (HCC)    a. on Eliquis, amiodarone, Toprol; b. s/p successful dccv 03/28/16; c. CHADS2VASc at least 3 (HTN, age x 2)  . Personal history of colonic adenoma 05/23/2007   04/2007 - 6 mm adenoma  . Prostate cancer ([place])   . Rosacea   . Schatzki's ring    grade 1 varices    Past Surgical History:  Procedure Laterality Date  . COLONOSCOPY    . ELECTROPHYSIOLOGIC STUDY N/A 03/28/2016   Procedure: CARDIOVERSION;  Surgeon: Minna Merritts, MD;  Location: [facility] ORS;  Service: Cardiovascular;  Laterality: N/A;  . ESOPHAGOGASTRODUODENOSCOPY    . ESOPHAGOGASTRODUODENOSCOPY    . ESOPHAGOGASTRODUODENOSCOPY (EGD) WITH PROPOFOL N/A 08/18/2016   Procedure: ESOPHAGOGASTRODUODENOSCOPY (EGD) WITH PROPOFOL;  Surgeon: Lollie Sails, MD;  Location: [facility] ENDOSCOPY;  Service: Endoscopy;  Laterality: N/A;  . GREEN LIGHT LASER TURP (TRANSURETHRAL RESECTION OF PROSTATE N/A 08/17/2015   Procedure: GREEN LIGHT LASER TURP (TRANSURETHRAL RESECTION OF PROSTATE;  Surgeon: Royston Cowper, MD;  Location:  [facility] ORS;  Service: Urology;  Laterality: N/A;  . NM MYOVIEW LTD    . PATELLA FRACTURE SURGERY     pin placed 1984  . REFRACTIVE SURGERY    . UPPER ESOPHAGEAL ENDOSCOPIC ULTRASOUND (EUS) N/A 09/21/2016   Procedure: UPPER ESOPHAGEAL ENDOSCOPIC ULTRASOUND (EUS);  Surgeon: Reita Cliche, MD;  Location: [facility] ENDOSCOPY;  Service: Gastroenterology;  Laterality: N/A;     Current  Outpatient Medications  Medication Sig Dispense Refill  . amiodarone (PACERONE) 200 MG tablet TAKE 1 TABLET ONCE DAILY 30 tablet 0  . diclofenac Sodium (VOLTAREN) 1 % GEL Apply topically.    . docusate sodium (COLACE) 100 MG capsule Take 100 mg by mouth daily.    Marland Kitchen doxycycline (VIBRAMYCIN) 50 MG capsule 1 CAPSULE BY MOUTH DAILY TAKE WITH FOOD AND PLENTY OF FLUID 30 capsule 11  . ELIQUIS 5 MG TABS tablet TAKE 1 TABLET BY MOUTH TWICE A DAY 60 tablet 6  . esomeprazole (NEXIUM) 20 MG capsule Take 20 mg by mouth daily at 12 noon.     . furosemide (LASIX) 20 MG tablet TAKE 1 TABLET BY MOUTH EVERY DAY 90 tablet 3  . losartan (COZAAR) 100 MG tablet TAKE 1 TABLET BY MOUTH EVERY DAY IN THE MORNING 90 tablet 3  . metoprolol succinate (TOPROL-XL) 25 MG 24 hr tablet Take 1 tablet (25 mg total) by mouth daily. 90 tablet 1  . metroNIDAZOLE (METROGEL) 1 % gel APPLY 1 GRAM ON THE SKIN AT BEDTIME 45 g 11  . Multiple Vitamin (MULTI-VITAMINS) TABS Take 1 tablet by mouth daily.     . mupirocin ointment (BACTROBAN) 2 % Apply to aa's wounds QD until healed 22 g 3  . Omega-3 Fatty Acids (FISH OIL PO) Take 1 capsule by mouth 2 (two) times daily.    . permethrin (ELIMITE) 5 % cream Apply 1 application topically 2 (two) times daily.     [name] Glycol-Propyl Glycol (SYSTANE OP) Place 1 drop into both eyes daily as needed (dry eyes).    . pravastatin (PRAVACHOL) 40 MG tablet TAKE ONE TABLET BY MOUTH EVERY DAY 90 tablet 3  . tamsulosin (FLOMAX) 0.4 MG CAPS capsule Take 1 capsule (0.4 mg total) by mouth daily after supper. 30 capsule 5   No current facility-administered medications for this visit.    Allergies:   Codeine    Social History:  The patient  reports that he quit smoking about 61 years ago. His smoking use included cigarettes. He has a 7.00 pack-year smoking history. He has never used smokeless tobacco. He reports that he does not drink alcohol and does not use drugs.   Family History:  The patient's  family history includes Alzheimer's disease in his mother; Cancer in his mother; Diabetes in his brother.    ROS:  Please see the history of present illness.   Otherwise, review of systems are positive for none.   All other systems are reviewed and negative.    PHYSICAL EXAM: VS:  BP 116/72   Pulse 85   Ht 5\' 9"  (1.753 m)   Wt 213 lb (96.6 kg)   BMI 31.45 kg/m  , BMI Body mass index is 31.45 kg/m. GEN: Well nourished, well developed, in no acute distress  HEENT: normal  Neck: no JVD, carotid bruits, or masses Cardiac: Irregularly irregular; no murmurs, rubs, or gallops,no edema  Respiratory:  clear to auscultation bilaterally, normal work of breathing GI: soft, nontender, nondistended, + BS MS: no deformity or atrophy  Skin:  warm and dry, no rash Neuro:  Strength and sensation are intact Psych: euthymic mood, full affect   EKG:  EKG is ordered today. The ekg ordered today demonstrates atrial fibrillation with ventricular rate of 85 bpm.   Recent Labs: No results found for requested labs within last 8760 hours.    Lipid Panel    Component Value Date/Time   CHOL 181 11/22/2010 1012   TRIG 82.0 11/22/2010 1012   HDL 54.40 11/22/2010 1012   CHOLHDL 3 11/22/2010 1012   VLDL 16.4 11/22/2010 1012   LDLCALC 110 (H) 11/22/2010 1012   LDLDIRECT 126.2 05/01/2007 0954      Wt Readings from Last 3 Encounters:  06/03/20 213 lb (96.6 kg)  05/11/20 213 lb (96.6 kg)  12/11/19 212 lb 2 oz (96.2 kg)       PAD Screen 03/09/2016  Previous PAD dx? No  Previous surgical procedure? No  Pain with walking? No  Feet/toe relief with dangling? No  Painful, non-healing ulcers? No  Extremities discolored? No      ASSESSMENT AND PLAN:  1.  Persistent atrial fibrillation: Ventricular rate improved but he continues to be symptomatic.  Given continued symptoms, recommend proceeding with cardioversion.  I discussed the procedure in details as well as risk and benefits.  He has not  missed any dose of Eliquis.  2. Essential hypertension: Blood pressure is controlled on current medications.  3. Aortic atherosclerosis: The patient has normal pulses and no evidence of obstructive peripheral arterial disease.  4. Hyperlipidemia: Currently on pravastatin.  Most recent lipid profile in July showed an LDL of 109.  5. Pulmonary nodules: Followed by primary care physician.  6. Chronic diastolic heart failure with pulmonary hypertension: Continue small dose furosemide.  Recent echocardiogram showed normal LV systolic function.   Disposition:   FU  in few weeks after cardioversion.  Signed,  [name], MD  06/03/2020 9:43 AM    [facility]

## 2020-06-03 NOTE — Patient Instructions (Signed)
Medication Instructions:  Your physician recommends that you continue on your current medications as directed. Please refer to the Current Medication list given to you today.  The refill authorization for Amiodarone was sent to your pharmacy on 06/01/20.  *If you need a refill on your cardiac medications before your next appointment, please call your pharmacy*   Lab Work: Bmet and Cbc today.  You will need a COVID test on 06/04/20. Please report to the [facility] medical arts building drive up test site between 8am-1pm.  If you have labs (blood work) drawn today and your tests are completely normal, you will receive your results only by: Marland Kitchen MyChart Message (if you have MyChart) OR . A paper copy in the mail If you have any lab test that is abnormal or we need to change your treatment, we will call you to review the results.   Testing/Procedures: Your physician has recommended that you have a Cardioversion (DCCV). Electrical Cardioversion uses a jolt of electricity to your heart either through paddles or wired patches attached to your chest. This is a controlled, usually prescheduled, procedure. Defibrillation is done under light anesthesia in the hospital, and you usually go home the day of the procedure. This is done to get your heart back into a normal rhythm. You are not awake for the procedure. Please see the instruction sheet given to you today.     Follow-Up: At [facility], you and your health needs are our priority.  As part of our continuing mission to provide you with exceptional heart care, we have created designated Provider Care Teams.  These Care Teams include your primary Cardiologist (physician) and Advanced Practice Providers (APPs -  Physician Assistants and Nurse Practitioners) who all work together to provide you with the care you need, when you need it.  We recommend signing up for the patient portal called "MyChart".  Sign up information is provided on this After Visit  Summary.  MyChart is used to connect with patients for Virtual Visits (Telemedicine).  Patients are able to view lab/test results, encounter notes, upcoming appointments, etc.  Non-urgent messages can be sent to your provider as well.   To learn more about what you can do with MyChart, go to [email or web address].    Your next appointment:   4 week(s) with an EKG  The format for your next appointment:   In Person  Provider:   You may see [name], MD or one of the following Advanced Practice Providers on your designated Care Team:    Murray Hodgkins, NP  [name], PA-C  Marrianne Mood, PA-C  Cadence Kathlen Mody, [place]  Laurann [place], NP    Other Instructions You are scheduled for a Cardioversion on Mon 06/07/20 with Dr. Fletcher Anon Please arrive at the [facility] at 6:30 a.m. on the day of your procedure.  DIET INSTRUCTIONS:  Nothing to eat or drink after midnight except your medications with a              sip of water.         1) Labs: BMP and CBC today. COVID test on 06/04/20  2) Medications:  YOU MAY TAKE ALL of your remaining medications with a small amount of water.  3) Must have a responsible person to drive you home.  4) Bring a current list of your medications and current insurance cards.    If you have any questions after you get home, please call the office at [phone]

## 2020-06-04 ENCOUNTER — Other Ambulatory Visit
Admission: RE | Admit: 2020-06-04 | Discharge: 2020-06-04 | Disposition: A | Discharge status: Home / Self Care | Payer: Medicare HMO | Source: Ambulatory Visit | Attending: Cardiovascular Disease | Admitting: Cardiovascular Disease

## 2020-06-04 DIAGNOSIS — Z01812 Encounter for preprocedural laboratory examination: Secondary | ICD-10-CM | POA: Insufficient documentation

## 2020-06-04 DIAGNOSIS — Z20822 Contact with and (suspected) exposure to covid-19: Secondary | ICD-10-CM | POA: Insufficient documentation

## 2020-06-04 LAB — CBC WITH DIFFERENTIAL/PLATELET
Basophils Absolute: 0 10*3/uL (ref 0.0–0.2)
Basos: 0 %
EOS (ABSOLUTE): 0 10*3/uL (ref 0.0–0.4)
Eos: 1 %
Hematocrit: 41.4 % (ref 37.5–51.0)
Hemoglobin: 13.8 g/dL (ref 13.0–17.7)
Immature Grans (Abs): 0 10*3/uL (ref 0.0–0.1)
Immature Granulocytes: 0 %
Lymphocytes Absolute: 1.3 10*3/uL (ref 0.7–3.1)
Lymphs: 35 %
MCH: 30.3 pg (ref 26.6–33.0)
MCHC: 33.3 g/dL (ref 31.5–35.7)
MCV: 91 fL (ref 79–97)
Monocytes Absolute: 0.4 10*3/uL (ref 0.1–0.9)
Monocytes: 10 %
Neutrophils Absolute: 2 10*3/uL (ref 1.4–7.0)
Neutrophils: 54 %
Platelets: 190 10*3/uL (ref 150–450)
RBC: 4.56 x10E6/uL (ref 4.14–5.80)
RDW: 14.1 % (ref 11.6–15.4)
WBC: 3.7 10*3/uL (ref 3.4–10.8)

## 2020-06-04 LAB — BASIC METABOLIC PANEL
BUN/Creatinine Ratio: 15 (ref 10–24)
BUN: 18 mg/dL (ref 8–27)
CO2: 21 mmol/L (ref 20–29)
Calcium: 9.1 mg/dL (ref 8.6–10.2)
Chloride: 100 mmol/L (ref 96–106)
Creatinine, Ser: 1.17 mg/dL (ref 0.76–1.27)
Glucose: 133 mg/dL — ABNORMAL HIGH (ref 65–99)
Potassium: 4.2 mmol/L (ref 3.5–5.2)
Sodium: 137 mmol/L (ref 134–144)
eGFR: 62 mL/min/{1.73_m2} (ref >59)

## 2020-06-04 LAB — SARS CORONAVIRUS 2 (TAT 6-24 HRS): SARS Coronavirus 2: NEGATIVE

## 2020-06-07 ENCOUNTER — Ambulatory Visit
Admission: RE | Admit: 2020-06-07 | Discharge: 2020-06-07 | Disposition: A | Discharge status: Home / Self Care | Payer: Medicare HMO | Attending: Cardiovascular Disease | Admitting: Cardiovascular Disease

## 2020-06-07 ENCOUNTER — Encounter
Admission: RE | Disposition: A | Discharge status: Home / Self Care | Payer: Self-pay | Source: Home / Self Care | Attending: Cardiovascular Disease

## 2020-06-07 ENCOUNTER — Other Ambulatory Visit: Payer: Self-pay

## 2020-06-07 ENCOUNTER — Ambulatory Visit: Payer: Medicare HMO | Admitting: Anesthesiology

## 2020-06-07 ENCOUNTER — Encounter: Payer: Self-pay | Admitting: Cardiovascular Disease

## 2020-06-07 DIAGNOSIS — I4819 Other persistent atrial fibrillation: Secondary | ICD-10-CM | POA: Diagnosis present

## 2020-06-07 DIAGNOSIS — E785 Hyperlipidemia, unspecified: Secondary | ICD-10-CM | POA: Insufficient documentation

## 2020-06-07 DIAGNOSIS — Z820 Family history of epilepsy and other diseases of the nervous system: Secondary | ICD-10-CM | POA: Diagnosis not present

## 2020-06-07 DIAGNOSIS — Z87891 Personal history of nicotine dependence: Secondary | ICD-10-CM | POA: Insufficient documentation

## 2020-06-07 DIAGNOSIS — I272 Pulmonary hypertension, unspecified: Secondary | ICD-10-CM | POA: Insufficient documentation

## 2020-06-07 DIAGNOSIS — I110 Hypertensive heart disease with heart failure: Secondary | ICD-10-CM | POA: Diagnosis not present

## 2020-06-07 DIAGNOSIS — Z923 Personal history of irradiation: Secondary | ICD-10-CM | POA: Insufficient documentation

## 2020-06-07 DIAGNOSIS — Z885 Allergy status to narcotic agent status: Secondary | ICD-10-CM | POA: Insufficient documentation

## 2020-06-07 DIAGNOSIS — Z7901 Long term (current) use of anticoagulants: Secondary | ICD-10-CM | POA: Insufficient documentation

## 2020-06-07 DIAGNOSIS — Z85828 Personal history of other malignant neoplasm of skin: Secondary | ICD-10-CM | POA: Diagnosis not present

## 2020-06-07 DIAGNOSIS — Z79899 Other long term (current) drug therapy: Secondary | ICD-10-CM | POA: Diagnosis not present

## 2020-06-07 DIAGNOSIS — R918 Other nonspecific abnormal finding of lung field: Secondary | ICD-10-CM | POA: Insufficient documentation

## 2020-06-07 DIAGNOSIS — I5032 Chronic diastolic (congestive) heart failure: Secondary | ICD-10-CM | POA: Diagnosis not present

## 2020-06-07 DIAGNOSIS — Z809 Family history of malignant neoplasm, unspecified: Secondary | ICD-10-CM | POA: Diagnosis not present

## 2020-06-07 DIAGNOSIS — I700 Atherosclerosis of aorta: Secondary | ICD-10-CM | POA: Insufficient documentation

## 2020-06-07 DIAGNOSIS — Z8546 Personal history of malignant neoplasm of prostate: Secondary | ICD-10-CM | POA: Diagnosis not present

## 2020-06-07 DIAGNOSIS — Z833 Family history of diabetes mellitus: Secondary | ICD-10-CM | POA: Diagnosis not present

## 2020-06-07 HISTORY — PX: CARDIOVERSION: SHX1299

## 2020-06-07 SURGERY — CARDIOVERSION
Anesthesia: General

## 2020-06-07 MED ORDER — SUGAMMADEX SODIUM 500 MG/5ML IV SOLN
INTRAVENOUS | Status: AC
  Filled 2020-06-07: qty 5

## 2020-06-07 MED ORDER — SODIUM CHLORIDE 0.9 % IV SOLN: INTRAVENOUS | Status: DC | PRN

## 2020-06-07 MED ORDER — ROCURONIUM BROMIDE 10 MG/ML (PF) SYRINGE
PREFILLED_SYRINGE | INTRAVENOUS | Status: AC
  Filled 2020-06-07: qty 10

## 2020-06-07 MED ORDER — ATROPINE SULFATE 1 MG/10ML IJ SOSY
PREFILLED_SYRINGE | INTRAMUSCULAR | Status: AC
  Filled 2020-06-07: qty 10

## 2020-06-07 MED ORDER — PROPOFOL 500 MG/50ML IV EMUL
INTRAVENOUS | Status: AC
  Filled 2020-06-07: qty 50

## 2020-06-07 MED ORDER — SUCCINYLCHOLINE CHLORIDE 200 MG/10ML IV SOSY
PREFILLED_SYRINGE | INTRAVENOUS | Status: AC
  Filled 2020-06-07: qty 10

## 2020-06-07 MED ORDER — EPINEPHRINE 1 MG/10ML IJ SOSY
PREFILLED_SYRINGE | INTRAMUSCULAR | Status: AC
  Filled 2020-06-07: qty 10

## 2020-06-07 MED ORDER — PHENYLEPHRINE HCL (PRESSORS) 10 MG/ML IV SOLN
INTRAVENOUS | Status: AC
  Filled 2020-06-07: qty 1

## 2020-06-07 MED ORDER — PROPOFOL 10 MG/ML IV BOLUS
INTRAVENOUS | Status: DC | PRN
  Administered 2020-06-07: 60 mg via INTRAVENOUS
  Administered 2020-06-07: 30 mg via INTRAVENOUS

## 2020-06-07 NOTE — CV Procedure (Signed)
Cardioversion note: A standard informed consent was obtained. Timeout was performed. The pads were placed in the anterior posterior fashion. The patient was given propofol by the anesthesia team.  Successful cardioversion was performed with a 200 J. The patient converted to sinus rhythm. Pre-and post EKGs were reviewed. The patient tolerated the procedure with no immediate complications.  Recommendations: Continue same medications and follow-up in 2-3 weeks.  

## 2020-06-07 NOTE — Interval H&P Note (Signed)
History and Physical Interval Note:  06/07/2020 7:54 AM  Zachary Green  has presented today for surgery, with the diagnosis of Cardioversion  Afib.  The various methods of treatment have been discussed with the patient and family. After consideration of risks, benefits and other options for treatment, the patient has consented to  Procedure(s): CARDIOVERSION (N/A) as a surgical intervention.  The patient's history has been reviewed, patient examined, no change in status, stable for surgery.  I have reviewed the patient's chart and labs.  Questions were answered to the patient's satisfaction.     [name]

## 2020-06-07 NOTE — Anesthesia Preprocedure Evaluation (Addendum)
Anesthesia Evaluation  Patient identified by MRN, date of birth, ID band Patient awake    Reviewed: Allergy & Precautions, NPO status , Patient's Chart, lab work & pertinent test results  History of Anesthesia Complications Negative for: history of anesthetic complications  Airway Mallampati: II  TM Distance: >3 FB Neck ROM: Full    Dental  (+) Upper Dentures   Pulmonary neg pulmonary ROS, neg sleep apnea, neg COPD, Patient abstained from smoking.Not current smoker, former smoker,    Pulmonary exam normal breath sounds clear to auscultation       Cardiovascular Exercise Tolerance: Good METShypertension, (-) CAD and (-) Past MI + dysrhythmias Atrial Fibrillation + Valvular Problems/Murmurs  Rhythm:Irregular Rate:Normal - Systolic murmurs 1. Left ventricular ejection fraction, by estimation, is 60 to 65%. The  left ventricle has normal function. The left ventricle has no regional  wall motion abnormalities. There is moderate left ventricular hypertrophy.  Left ventricular diastolic  parameters are indeterminate.  2. Right ventricular systolic function is normal. The right ventricular  size is normal. There is mildly elevated pulmonary artery systolic  pressure.  3. Left atrial size was moderately dilated.  4. The mitral valve is normal in structure. Mild mitral valve  regurgitation.    Neuro/Psych negative neurological ROS  negative psych ROS   GI/Hepatic GERD  Controlled,(+)     (-) substance abuse  ,   Endo/Other  neg diabetes  Renal/GU negative Renal ROS     Musculoskeletal   Abdominal   Peds  Hematology   Anesthesia Other Findings Past Medical History: No date: Actinic keratosis No date: Arthritis 12/24/2013: Basal cell carcinoma     Comment:  Left sup. lat. chest. Nodular pattern. Tx: [facility] 12/23/2014: Basal cell carcinoma     Comment:  Right lateral top of shoulder. Nodular pattern. Tx:  [facility] 01/05/2016: Basal cell carcinoma     Comment:  Right ant. shoulder infraclavicular. Nodular pattern.               Tx: EDC No date: BPH (benign prostatic hypertrophy) No date: Chronic diastolic CHF (congestive heart failure) ([place])     Comment:  a. echo 12/17: 55-60%, no RWMA, trivial AI, moderate MR               with systolic bowing without prolapse, moderate TR, mild               biatrial enlargement, PASP 50 mmHg No date: Diverticulitis No date: ED (erectile dysfunction) No date: GERD (gastroesophageal reflux disease) No date: Hyperlipidemia No date: Hypertension No date: Impaired fasting glucose No date: Persistent atrial fibrillation (HCC)     Comment:  a. on Eliquis, amiodarone, Toprol; b. s/p successful               dccv 03/28/16; c. CHADS2VASc at least 3 (HTN, age x 2) 05/23/2007: Personal history of colonic adenoma     Comment:  04/2007 - 6 mm adenoma No date: Prostate cancer ([place]) No date: Rosacea No date: Schatzki's ring     Comment:  grade 1 varices  Reproductive/Obstetrics                            Anesthesia Physical Anesthesia Plan  ASA: III  Anesthesia Plan: General   Post-op Pain Management:    Induction: Intravenous  PONV Risk Score and Plan: 2 and Ondansetron, Propofol infusion and TIVA  Airway Management Planned: Nasal Cannula  Additional Equipment: None  Intra-op Plan:   Post-operative Plan:   Informed Consent: I have reviewed the patients History and Physical, chart, labs and discussed the procedure including the risks, benefits and alternatives for the proposed anesthesia with the patient or authorized representative who has indicated his/her understanding and acceptance.     Dental advisory given  Plan Discussed with: CRNA and Surgeon  Anesthesia Plan Comments: (Discussed risks of anesthesia with patient, including possibility of difficulty with spontaneous ventilation under anesthesia necessitating airway  intervention, PONV, and rare risks such as cardiac or respiratory or neurological events. Patient understands.)        Anesthesia Quick Evaluation

## 2020-06-07 NOTE — Transfer of Care (Signed)
Immediate Anesthesia Transfer of Care Note  Patient: Zachary Green  Procedure(s) Performed: CARDIOVERSION (N/A )  Patient Location: Special Procedures  Anesthesia Type:General  Level of Consciousness: drowsy and patient cooperative  Airway & Oxygen Therapy: Patient Spontanous Breathing and Patient connected to nasal cannula oxygen  Post-op Assessment: Report given to RN and Post -op Vital signs reviewed and stable  Post vital signs: Reviewed and stable  Last Vitals:  Vitals Value Taken Time  BP 95/70 06/07/20 0751  Temp    Pulse 45 06/07/20 0751  Resp 22 06/07/20 0752  SpO2 99 % 06/07/20 0751    Last Pain:  Vitals:   06/07/20 0701  TempSrc: Oral  PainSc: 0-No pain         Complications: No complications documented.

## 2020-06-07 NOTE — Discharge Instructions (Signed)
Electrical Cardioversion Electrical cardioversion is the delivery of a jolt of electricity to restore a normal rhythm to the heart. A rhythm that is too fast or is not regular keeps the heart from pumping well. In this procedure, sticky patches or metal paddles are placed on the chest to deliver electricity to the heart from a device. This procedure may be done in an emergency if:  There is low or no blood pressure as a result of the heart rhythm.  Normal rhythm must be restored as fast as possible to protect the brain and heart from further damage.  It may save a life. This may also be a scheduled procedure for irregular or fast heart rhythms that are not immediately life-threatening. Tell a health care provider about:  Any allergies you have.  All medicines you are taking, including vitamins, herbs, eye drops, creams, and over-the-counter medicines.  Any problems you or family members have had with anesthetic medicines.  Any blood disorders you have.  Any surgeries you have had.  Any medical conditions you have.  Whether you are pregnant or may be pregnant. What are the risks? Generally, this is a safe procedure. However, problems may occur, including:  Allergic reactions to medicines.  A blood clot that breaks free and travels to other parts of your body.  The possible return of an abnormal heart rhythm within hours or days after the procedure.  Your heart stopping (cardiac arrest). This is rare. What happens before the procedure? Medicines  Your health care provider may have you start taking: ? Blood-thinning medicines (anticoagulants) so your blood does not clot as easily. ? Medicines to help stabilize your heart rate and rhythm.  Ask your health care provider about: ? Changing or stopping your regular medicines. This is especially important if you are taking diabetes medicines or blood thinners. ? Taking medicines such as aspirin and ibuprofen. These medicines can  thin your blood. Do not take these medicines unless your health care provider tells you to take them. ? Taking over-the-counter medicines, vitamins, herbs, and supplements. General instructions  Follow instructions from your health care provider about eating or drinking restrictions.  Plan to have someone take you home from the hospital or clinic.  If you will be going home right after the procedure, plan to have someone with you for 24 hours.  Ask your health care provider what steps will be taken to help prevent infection. These may include washing your skin with a germ-killing soap. What happens during the procedure?  An IV will be inserted into one of your veins.  Sticky patches (electrodes) or metal paddles may be placed on your chest.  You will be given a medicine to help you relax (sedative).  An electrical shock will be delivered. The procedure may vary among health care providers and hospitals.   What can I expect after the procedure?  Your blood pressure, heart rate, breathing rate, and blood oxygen level will be monitored until you leave the hospital or clinic.  Your heart rhythm will be watched to make sure it does not change.  You may have some redness on the skin where the shocks were given. Follow these instructions at home:  Do not drive for 24 hours if you were given a sedative during your procedure.  Take over-the-counter and prescription medicines only as told by your health care provider.  Ask your health care provider how to check your pulse. Check it often.  Rest for 48 hours after the procedure   or as told by your health care provider.  Avoid or limit your caffeine use as told by your health care provider.  Keep all follow-up visits as told by your health care provider. This is important. Contact a health care provider if:  You feel like your heart is beating too quickly or your pulse is not regular.  You have a serious muscle cramp that does not go  away. Get help right away if:  You have discomfort in your chest.  You are dizzy or you feel faint.  You have trouble breathing or you are short of breath.  Your speech is slurred.  You have trouble moving an arm or leg on one side of your body.  Your fingers or toes turn cold or blue. Summary  Electrical cardioversion is the delivery of a jolt of electricity to restore a normal rhythm to the heart.  This procedure may be done right away in an emergency or may be a scheduled procedure if the condition is not an emergency.  Generally, this is a safe procedure.  After the procedure, check your pulse often as told by your health care provider. This information is not intended to replace advice given to you by your health care provider. Make sure you discuss any questions you have with your health care provider. Document Revised: 10/14/2018 Document Reviewed: 10/14/2018 Elsevier Patient Education  2021 [employer] Inc.  

## 2020-06-07 NOTE — Anesthesia Postprocedure Evaluation (Signed)
Anesthesia Post Note  Patient: Zachary Green  Procedure(s) Performed: CARDIOVERSION (N/A )  Patient location during evaluation: Specials Recovery Anesthesia Type: General Level of consciousness: awake and alert Pain management: pain level controlled Vital Signs Assessment: post-procedure vital signs reviewed and stable Respiratory status: spontaneous breathing, nonlabored ventilation, respiratory function stable and patient connected to nasal cannula oxygen Cardiovascular status: blood pressure returned to baseline and stable Postop Assessment: no apparent nausea or vomiting Anesthetic complications: no   No complications documented.   Last Vitals:  Vitals:   06/07/20 0752 06/07/20 0755  BP:  (!) 84/59  Pulse:  (!) 50  Resp: (!) 22 19  Temp:    SpO2:  95%    Last Pain:  Vitals:   06/07/20 0701  TempSrc: Oral  PainSc: 0-No pain                 Arita Miss

## 2020-06-08 ENCOUNTER — Encounter: Payer: Self-pay | Admitting: Cardiovascular Disease

## 2020-06-14 ENCOUNTER — Other Ambulatory Visit: Payer: Self-pay | Admitting: Urology

## 2020-06-15 ENCOUNTER — Telehealth: Payer: Self-pay | Admitting: Cardiovascular Disease

## 2020-06-15 NOTE — Telephone Encounter (Signed)
   Primary Cardiologist: [name], MD  Chart reviewed as part of pre-operative protocol coverage. Given past medical history and time since last visit, based on ACC/AHA guidelines, PAIGE VANDERWOUDE would be at acceptable risk for the planned procedure without further cardiovascular testing.   Patient with diagnosis of afib on Eliquis for anticoagulation.    Procedure: Transurethral resection of bladder neck contracture  Date of procedure: 07/05/20  CHA2DS2-VASc Score = 5  This indicates a 7.2% annual risk of stroke. The patient's score is based upon: CHF History: Yes HTN History: Yes Diabetes History: No Stroke History: No Vascular Disease History: Yes Age Score: 2 Gender Score: 0   CrCl 76mL/min Platelet count 190K  Pt underwent DCCV on 06/07/20 and will need to complete 4 weeks of uninterrupted anticoagulation afterwards before he can hold Eliquis for 2 days for procedure. His procedure will need to be moved back by at least a few days as the earliest date he can start holding his Eliquis would be on 4/12 for a 4/14 procedure date.  I will route this recommendation to the requesting party via Epic fax function and remove from pre-op pool.  Please call with questions.  Jossie Ng. Cleaver NP-C    06/15/2020, 3:56 PM [address] Office [phone] Fax [phone]

## 2020-06-15 NOTE — Telephone Encounter (Signed)
   [facility] Pre-operative Risk Assessment    HEARTCARE STAFF: - Please ensure there is not already an duplicate clearance open for this procedure. - Under Visit Info/Reason for Call, type in Other and utilize the format Clearance MM/DD/YY or Clearance TBD. Do not use dashes or single digits. - If request is for dental extraction, please clarify the # of teeth to be extracted.  Request for surgical clearance:  1. What type of surgery is being performed? Transurethral resection of bladder neck contracture   2. When is this surgery scheduled? 07/05/20  3. What type of clearance is required (medical clearance vs. Pharmacy clearance to hold med vs. Both)? both  4. Are there any medications that need to be held prior to surgery and how long? Can patient stop Eliquis 48 hours prior to surgery?  5. Practice name and name of physician performing surgery? [facility] - Dr Franchot Gallo   6. What is the office phone number? [phone]   7.   What is the office fax number? [phone]  8.   Anesthesia type (None, local, MAC, general) ? Not listed    Ace Gins 06/15/2020, 1:34 PM  _________________________________________________________________   (provider comments below)

## 2020-06-15 NOTE — Telephone Encounter (Signed)
Patient with diagnosis of afib on Eliquis for anticoagulation.    Procedure: Transurethral resection of bladder neck contracture   Date of procedure: 07/05/20  CHA2DS2-VASc Score = 5  This indicates a 7.2% annual risk of stroke. The patient's score is based upon: CHF History: Yes HTN History: Yes Diabetes History: No Stroke History: No Vascular Disease History: Yes Age Score: 2 Gender Score: 0   CrCl 39mL/min Platelet count 190K  Pt underwent DCCV on 06/07/20 and will need to complete 4 weeks of uninterrupted anticoagulation afterwards before he can hold Eliquis for 2 days for procedure. His procedure will need to be moved back by at least a few days as the earliest date he can start holding his Eliquis would be on 4/12 for a 4/14 procedure date.

## 2020-06-25 ENCOUNTER — Other Ambulatory Visit: Payer: Self-pay | Admitting: Cardiovascular Disease

## 2020-06-25 NOTE — Telephone Encounter (Signed)
Rx request sent to pharmacy.  

## 2020-06-29 NOTE — Progress Notes (Signed)
Cardiology Office Note    Date:  07/01/2020   ID:  Zachary Green, DOB December 20, 1935, MRN [id number]  PCP:  [name], MD  Cardiologist:  [name], MD  Electrophysiologist:  None   Chief Complaint: Follow up DCCV  History of Present Illness:   Zachary Green is a 85 y.o. male with history of persistent Afib on amiodarone and Eliquis s/p prior DCCV s/p repeat DCCV 06/07/2020 for recurrent Afib, aortic atherosclerosis, HFpEF/pulmonary hypertension, pulmonary nodules, BPH and prostate cancer s/p radiation therapy, HTN, HLD, prior tobacco use for 5 years in his 47s, and family history notable for Afib who presents for follow up of recent DCCV.   Echo in 2017 showed a normal LVSF, moderate MR/TR, and moderate pulmonary hypertension. He was treated with low dose Lasix with stable symptoms noted in follow up. Nuclear stress test in 2017 was normal.   In follow up in 11/2019, he was maintaining sinus rhythm.  He was seen in 04/2020 with recurrent palpitations, dizziness, fatigue, and increased SOB that dated back to 03/2020. He was noted to be in Afib with ventricular rate of 100 bpm. He was on amiodarone 100 mg daily and was reloaded followed by increased dose of maintenance dose of 200 mg daily. His Toprol was also increased to 25 mg daily. Echo on 05/27/2020 showed an EF of 60-65%, no RWMA, indeterminate LV diastolic function parameters, normal RVSF and ventricular cavity size, mildly elevated PASP at 36.2 mmHg, moderately dilated left atrium at 4.6 cm, and mild mitral regurgitation. When he was seen in follow up in 05/2020, he remained in Afib with controlled ventricular response and continued to note exertional dyspnea.   He underwent successful DCCV on 09/15/2977 without complication.   He is scheduled to undergo a transurethral bladder neck resection on 07/12/2020.  Initially, this procedure had to be postponed several days in an effort to allow him to complete 4 weeks of uninterrupted  anticoagulation following the above DCCV.  He comes in today accompanied by his son.  Shortly after his cardioversion, perhaps as early as the following day, there were concerns he was back in A. fib with manual pulse rates noted to be irregular at that time.  His lightheadedness and increased fatigue have persisted since his cardioversion.  No chest pain, significant dyspnea, presyncope, or syncope.  There is some positional dizziness.  No lower extremity swelling, abdominal distention, orthopnea, PND, or early satiety.  No falls.  No hematochezia or melena.  He has not missed any doses of Eliquis.   Labs independently reviewed: 05/2020 - HGB 13.8, PLT 190, potassium 4.2, BUN 18, SCr 1.17, TC 184, TG 138, HDL 56, LDL 100, albumin 4.3, AST/ALT normal 02/2016 - TSH normal  Past Medical History:  Diagnosis Date  . Actinic keratosis   . Arthritis   . Basal cell carcinoma 12/24/2013   Left sup. lat. chest. Nodular pattern. Tx: EDC  . Basal cell carcinoma 12/23/2014   Right lateral top of shoulder. Nodular pattern. Tx: EDC  . Basal cell carcinoma 01/05/2016   Right ant. shoulder infraclavicular. Nodular pattern. Tx: EDC  . BPH (benign prostatic hypertrophy)   . Chronic diastolic CHF (congestive heart failure) ([place])    a. echo 12/17: 55-60%, no RWMA, trivial AI, moderate MR with systolic bowing without prolapse, moderate TR, mild biatrial enlargement, PASP 50 mmHg  . Diverticulitis   . ED (erectile dysfunction)   . GERD (gastroesophageal reflux disease)   . Hyperlipidemia   . Hypertension   .  Impaired fasting glucose   . Persistent atrial fibrillation (HCC)    a. on Eliquis, amiodarone, Toprol; b. s/p successful dccv 03/28/16; c. CHADS2VASc at least 3 (HTN, age x 2)  . Personal history of colonic adenoma 05/23/2007   04/2007 - 6 mm adenoma  . Prostate cancer ([place])   . Rosacea   . Schatzki's ring    grade 1 varices    Past Surgical History:  Procedure Laterality Date  . CARDIOVERSION N/A  06/07/2020   Procedure: CARDIOVERSION;  Surgeon: Wellington Hampshire, MD;  Location: [facility] ORS;  Service: Cardiovascular;  Laterality: N/A;  . COLONOSCOPY    . ELECTROPHYSIOLOGIC STUDY N/A 03/28/2016   Procedure: CARDIOVERSION;  Surgeon: Minna Merritts, MD;  Location: [facility] ORS;  Service: Cardiovascular;  Laterality: N/A;  . ESOPHAGOGASTRODUODENOSCOPY    . ESOPHAGOGASTRODUODENOSCOPY    . ESOPHAGOGASTRODUODENOSCOPY (EGD) WITH PROPOFOL N/A 08/18/2016   Procedure: ESOPHAGOGASTRODUODENOSCOPY (EGD) WITH PROPOFOL;  Surgeon: Lollie Sails, MD;  Location: [facility] ENDOSCOPY;  Service: Endoscopy;  Laterality: N/A;  . GREEN LIGHT LASER TURP (TRANSURETHRAL RESECTION OF PROSTATE N/A 08/17/2015   Procedure: GREEN LIGHT LASER TURP (TRANSURETHRAL RESECTION OF PROSTATE;  Surgeon: Royston Cowper, MD;  Location: [facility] ORS;  Service: Urology;  Laterality: N/A;  . NM MYOVIEW LTD    . PATELLA FRACTURE SURGERY     pin placed 1984  . REFRACTIVE SURGERY    . UPPER ESOPHAGEAL ENDOSCOPIC ULTRASOUND (EUS) N/A 09/21/2016   Procedure: UPPER ESOPHAGEAL ENDOSCOPIC ULTRASOUND (EUS);  Surgeon: Reita Cliche, MD;  Location[facility] ENDOSCOPY;  Service: Gastroenterology;  Laterality: N/A;    Current Medications: Current Meds  Medication Sig  . Cholecalciferol (VITAMIN D3) 50 MCG (2000 UT) TABS Take 2,000 Units by mouth daily after supper.  . diclofenac Sodium (VOLTAREN) 1 % GEL Apply 1 application topically 4 (four) times daily as needed (pain).  Marland Kitchen docusate sodium (COLACE) 100 MG capsule Take 100 mg by mouth 2 (two) times daily as needed (constipation).  Marland Kitchen doxycycline (VIBRAMYCIN) 50 MG capsule 1 CAPSULE BY MOUTH DAILY TAKE WITH FOOD AND PLENTY OF FLUID  . ELIQUIS 5 MG TABS tablet TAKE 1 TABLET BY MOUTH TWICE A DAY  . esomeprazole (NEXIUM) 20 MG capsule Take 20 mg by mouth daily before breakfast.  . furosemide (LASIX) 20 MG tablet TAKE 1 TABLET BY MOUTH EVERY DAY  . losartan (COZAAR) 50 MG tablet Take 1 tablet (50 mg total) by  mouth daily.  . metroNIDAZOLE (METROGEL) 1 % gel APPLY 1 GRAM ON THE SKIN AT BEDTIME  . mupirocin ointment (BACTROBAN) 2 % 1 application as needed.  . Omega-3 Fatty Acids (FISH OIL) 1000 MG CAPS Take 1,000 mg by mouth in the morning and at bedtime.  . permethrin (ELIMITE) 5 % cream Apply 1 application topically 2 (two) times daily. Applied to cheeks  . Polyethyl Glycol-Propyl Glycol (SYSTANE OP) Place 1 drop into both eyes 2 (two) times daily as needed (scheduled in the morning & in the evening if needed for dry/irritated eyes.).  Marland Kitchen pravastatin (PRAVACHOL) 40 MG tablet TAKE ONE TABLET BY MOUTH EVERY DAY  . tamsulosin (FLOMAX) 0.4 MG CAPS capsule Take 1 capsule (0.4 mg total) by mouth daily after supper.  . vitamin E 180 MG (400 UNITS) capsule Take 400 Units by mouth in the morning.  . [DISCONTINUED] amiodarone (PACERONE) 200 MG tablet Take 1 tablet (200 mg total) by mouth daily. TAKE 1 TABLET BY MOUTH EVERY DAY  . [DISCONTINUED] losartan (COZAAR) 100 MG tablet TAKE 1  TABLET BY MOUTH EVERY DAY IN THE MORNING  . [DISCONTINUED] metoprolol succinate (TOPROL-XL) 25 MG 24 hr tablet Take 1 tablet (25 mg total) by mouth daily.    Allergies:   Codeine   Social History   Socioeconomic History  . Marital status: Married    Spouse name: Katharine Look  . Number of children: 4  . Years of education: Not on file  . Highest education level: Not on file  Occupational History  . Occupation: retired Teacher, music: retired  Tobacco Use  . Smoking status: Former Smoker    Packs/day: 1.00    Years: 7.00    Pack years: 7.00    Types: Cigarettes    Quit date: 03/28/1959    Years since quitting: 61.3  . Smokeless tobacco: Never Used  Vaping Use  . Vaping Use: Never used  Substance and Sexual Activity  . Alcohol use: No  . Drug use: No  . Sexual activity: Not on file  Other Topics Concern  . Not on file  Social History Narrative   Has living will   Wife is [place] health care POA (then oldest son,  Sherren Mocha)   Not sure about DNR---will accept resuscitation for now   Probably would not want a feeding tube   Social Determinants of Health   Financial Resource Strain: Not on file  Food Insecurity: Not on file  Transportation Needs: Not on file  Physical Activity: Not on file  Stress: Not on file  Social Connections: Not on file     Family History:  The patient's family history includes Alzheimer's disease in his mother; Cancer in his mother; Diabetes in his brother; Heart disease in his brother; Lung cancer in his brother.  ROS:   Review of Systems  Constitutional: Positive for malaise/fatigue. Negative for chills, diaphoresis, fever and weight loss.  HENT: Negative for congestion.   Eyes: Negative for discharge and redness.  Respiratory: Negative for cough, hemoptysis, sputum production, shortness of breath and wheezing.   Cardiovascular: Negative for chest pain, palpitations, orthopnea, claudication, leg swelling and PND.  Gastrointestinal: Negative for abdominal pain, blood in stool, heartburn, melena, nausea and vomiting.  Genitourinary: Negative for hematuria.  Musculoskeletal: Negative for falls and myalgias.  Skin: Negative for rash.  Neurological: Positive for weakness. Negative for dizziness, tingling, tremors, sensory change, speech change, focal weakness and loss of consciousness.       Lightheadedness  Endo/Heme/Allergies: Does not bruise/bleed easily.  Psychiatric/Behavioral: Negative for substance abuse. The patient is not nervous/anxious.   All other systems reviewed and are negative.    EKGs/Labs/Other Studies Reviewed:    Studies reviewed were summarized above. The additional studies were reviewed today:  2D echo 05/27/2020: 1. Left ventricular ejection fraction, by estimation, is 60 to 65%. The  left ventricle has normal function. The left ventricle has no regional  wall motion abnormalities. There is moderate left ventricular hypertrophy.  Left ventricular  diastolic  parameters are indeterminate.  2. Right ventricular systolic function is normal. The right ventricular  size is normal. There is mildly elevated pulmonary artery systolic  pressure.  3. Left atrial size was moderately dilated.  4. The mitral valve is normal in structure. Mild mitral valve  Regurgitation. __________  Carlton Adam MPI 03/16/2016:  There was no ST segment deviation noted during stress.  The study is normal.  This is a low risk study.  The left ventricular ejection fraction is normal (55-65%). __________  2D echo 03/09/2016: - Left ventricle: The  cavity size was normal. There was moderate  concentric hypertrophy. Systolic function was normal. The  estimated ejection fraction was in the range of 55% to 60%. Wall  motion was normal; there were no regional wall motion  abnormalities.  - Aortic valve: There was trivial regurgitation.  - Mitral valve: Systolic bowing without prolapse. There was  moderate regurgitation.  - Left atrium: The atrium was mildly dilated.  - Right atrium: The atrium was mildly dilated.  - Tricuspid valve: There was moderate regurgitation.  - Pulmonary arteries: Systolic pressure was moderately increased.  PA peak pressure: 50 mm Hg (S).  EKG:  EKG is ordered today.  The EKG ordered today demonstrates atrial flutter with 3-1 AV conduction with nonspecific ST-T changes  Recent Labs: 06/03/2020: BUN 18; Creatinine, Ser 1.17; Hemoglobin 13.8; Platelets 190; Potassium 4.2; Sodium 137  Recent Lipid Panel    Component Value Date/Time   CHOL 181 11/22/2010 1012   TRIG 82.0 11/22/2010 1012   HDL 54.40 11/22/2010 1012   CHOLHDL 3 11/22/2010 1012   VLDL 16.4 11/22/2010 1012   LDLCALC 110 (H) 11/22/2010 1012   LDLDIRECT 126.2 05/01/2007 0954    PHYSICAL EXAM:    VS:  BP 124/76 (BP Location: Left Arm, Patient Position: Sitting, Cuff Size: Large)   Pulse 88   Ht 5\' 7"  (1.702 m)   Wt 212 lb (96.2 kg)   SpO2 96%   BMI  33.20 kg/m   BMI: Body mass index is 33.2 kg/m.  Physical Exam Constitutional:      Appearance: He is well-developed.  HENT:     Head: Normocephalic and atraumatic.  Eyes:     General:        Right eye: No discharge.        Left eye: No discharge.  Neck:     Vascular: No JVD.  Cardiovascular:     Rate and Rhythm: Normal rate. Rhythm irregular.     Pulses: No midsystolic click and no opening snap.          Posterior tibial pulses are 2+ on the right side and 2+ on the left side.     Heart sounds: Normal heart sounds, S1 normal and S2 normal. Heart sounds not distant. No murmur heard. No friction rub.  Pulmonary:     Effort: Pulmonary effort is normal. No respiratory distress.     Breath sounds: Normal breath sounds. No decreased breath sounds, wheezing or rales.  Chest:     Chest wall: No tenderness.  Abdominal:     General: There is no distension.     Palpations: Abdomen is soft.     Tenderness: There is no abdominal tenderness.  Musculoskeletal:     Cervical back: Normal range of motion.  Skin:    General: Skin is warm and dry.     Nails: There is no clubbing.  Neurological:     Mental Status: He is alert and oriented to person, place, and time.  Psychiatric:        Speech: Speech normal.        Behavior: Behavior normal.        Thought Content: Thought content normal.        Judgment: Judgment normal.     Wt Readings from Last 3 Encounters:  07/01/20 212 lb (96.2 kg)  06/07/20 212 lb (96.2 kg)  06/03/20 213 lb (96.6 kg)     ASSESSMENT & PLAN:   1. Persistent Afib/flutter: Unfortunately, he is back in A. fib  with ventricular rates in the upper 80s bpm.  Review of his home BP log shows blood pressures in the 448J to 856D systolic with heart rates in the 80s to low 100s bpm.  Case was reviewed with his primary cardiologist with recommendation to discontinue amiodarone at this point as he has failed this medication.  We will titrate Toprol-XL to 25 mg twice daily.   In an effort to prevent significant hypotension we will decrease his losartan to 50 mg daily.  We will refer him to EP for consideration of Tikosyn.  Perhaps, if with improved rate control is overall symptoms improved we would be able to maintain him in A. fib.  We will see how he is feeling in follow-up to assess this strategy.  CHA2DS2-VASc at least 5 (CHF, HTN, age x2, vascular disease).  Continue Eliquis.  No symptoms concerning for bleeding with recent stable CBC.  2. HFpEF with pulmonary hypertension: Symptoms are overall stable.  He remains on low-dose Lasix.  3. Aortic atherosclerosis/HLD: LDL of 100 and 05/2020.  Currently on pravastatin.  4. HTN: Blood pressure is well controlled in the office today.  Continue current medications including titrated Toprol-XL and tapered losartan as outlined above, along with furosemide.  5. Preoperative cardiac risk stratification: He is scheduled to undergo a transurethral bladder neck resection.  Per Revised Cardiac Risk Index, he is low risk for noncardiac procedure.  Per [facility] Activity Status Index, he is able to achieve > 4 METs without cardiac limitation.  He may proceed with noncardiac procedure at an overall low risk.  He should hold Eliquis 48 hours prior to procedure, with a procedure date that is currently scheduled for 07/12/2020, he will have completed 4 weeks of uninterrupted anticoagulation prior to the holding of Eliquis.  Eliquis should be resumed as soon as safely possible post procedure per his surgical team.  6. Pulmonary nodules: Followed by PCP.  Disposition: F/u with Dr. Fletcher Anon or an APP in 1 week to assess response on titrated dose of Toprol.   Medication Adjustments/Labs and Tests Ordered: Current medicines are reviewed at length with the patient today.  Concerns regarding medicines are outlined above. Medication changes, Labs and Tests ordered today are summarized above and listed in the Patient Instructions accessible in Encounters.    Signed, [name], PA-C 07/01/2020 2:25 PM     [address] [place], [place] [address] [phone]

## 2020-07-01 ENCOUNTER — Ambulatory Visit: Payer: Medicare HMO | Admitting: Physician Assistant

## 2020-07-01 ENCOUNTER — Other Ambulatory Visit: Payer: Self-pay

## 2020-07-01 ENCOUNTER — Encounter: Payer: Self-pay | Admitting: Physician Assistant

## 2020-07-01 VITALS — BP 124/76 | HR 88 | Ht 67.0 in | Wt 212.0 lb

## 2020-07-01 DIAGNOSIS — I5032 Chronic diastolic (congestive) heart failure: Secondary | ICD-10-CM

## 2020-07-01 DIAGNOSIS — I10 Essential (primary) hypertension: Secondary | ICD-10-CM

## 2020-07-01 DIAGNOSIS — R918 Other nonspecific abnormal finding of lung field: Secondary | ICD-10-CM

## 2020-07-01 DIAGNOSIS — I272 Pulmonary hypertension, unspecified: Secondary | ICD-10-CM | POA: Diagnosis not present

## 2020-07-01 DIAGNOSIS — I700 Atherosclerosis of aorta: Secondary | ICD-10-CM | POA: Diagnosis not present

## 2020-07-01 DIAGNOSIS — Z01810 Encounter for preprocedural cardiovascular examination: Secondary | ICD-10-CM

## 2020-07-01 DIAGNOSIS — I4819 Other persistent atrial fibrillation: Secondary | ICD-10-CM | POA: Diagnosis not present

## 2020-07-01 MED ORDER — LOSARTAN POTASSIUM 50 MG PO TABS: 50.0000 mg | ORAL_TABLET | Freq: Every day | ORAL | 0 refills | Status: DC

## 2020-07-01 MED ORDER — METOPROLOL SUCCINATE ER 25 MG PO TB24: 25.0000 mg | ORAL_TABLET | Freq: Two times a day (BID) | ORAL | 0 refills | Status: DC

## 2020-07-01 NOTE — Patient Instructions (Signed)
Medication Instructions:   Your physician has recommended you make the following change in your medication:   1. DECREASE LOSARTAN - Take 50mg  by mouth daily.  2. INCREASE TOPROL - Take 25mg  by mouth twice daily.  3. STOP Amiodarone  *If you need a refill on your cardiac medications before your next appointment, please call your pharmacy*   Lab Work:  1. None ordered  If you have labs (blood work) drawn today and your tests are completely normal, you will receive your results only by: Marland Kitchen MyChart Message (if you have MyChart) OR . A paper copy in the mail If you have any lab test that is abnormal or we need to change your treatment, we will call you to review the results.   Testing/Procedures:  1. None Ordered   Follow-Up: At [facility], you and your health needs are our priority.  As part of our continuing mission to provide you with exceptional heart care, we have created designated Provider Care Teams.  These Care Teams include your primary Cardiologist (physician) and Advanced Practice Providers (APPs -  Physician Assistants and Nurse Practitioners) who all work together to provide you with the care you need, when you need it.  We recommend signing up for the patient portal called "MyChart".  Sign up information is provided on this After Visit Summary.  MyChart is used to connect with patients for Virtual Visits (Telemedicine).  Patients are able to view lab/test results, encounter notes, upcoming appointments, etc.  Non-urgent messages can be sent to your provider as well.   To learn more about what you can do with MyChart, go to [email or web address].    Your next appointment:   Next Friday  The format for your next appointment:   In Person  Provider:   [name], PA-C   Other Instructions

## 2020-07-02 ENCOUNTER — Encounter (HOSPITAL_BASED_OUTPATIENT_CLINIC_OR_DEPARTMENT_OTHER): Payer: Self-pay | Admitting: Urology

## 2020-07-06 ENCOUNTER — Encounter (HOSPITAL_BASED_OUTPATIENT_CLINIC_OR_DEPARTMENT_OTHER): Payer: Self-pay | Admitting: Urology

## 2020-07-06 ENCOUNTER — Other Ambulatory Visit: Payer: Self-pay

## 2020-07-06 NOTE — Progress Notes (Addendum)
ADDENDUM:  Chart reviewed by anesthesia, Dr Ambrose Pancoast MDA, stated ok to proceed barring acute status change dos.  Spoke w/ via phone for pre-op interview--- PT Lab needs dos---- Istat              Lab results------ current ekg in epic/ chart COVID test ------ 07-08-2020 @ 1030 @ [facility] at ------- 0730 on 07-12-2020 NPO after MN NO Solid Food.  Clear liquids from MN until--- 0630 Med rec completed Medications to take morning of surgery ----- Toprol, Nexium Diabetic medication ----- n/a  Patient instructed to bring photo id and insurance card day of surgery Patient aware to have Driver (ride ) / caregiver    for 24 hours after surgery -- son, Zachary Green Patient Special Instructions ----- n/a  Pre-Op special Istructions ----- pt has cardiac clearance/ office note dated 07-01-2020 by [name] PA in epic/ chart  Patient verbalized understanding of instructions that were given at this phone interview. Patient denies chest pain, fever, cough at this phone interview.   Anesthesia Review:  HTN:  Persistant Afib( s/p DCCV x2  Last one 06-07-2020) ;  Chronic diastolic CHF;  Pt denies chest pain, does get sob with exertion, gets light headed when goes in/ out of afib, and does not have peripheral swelling.    PCP:  Dr Raliegh Ip. Linthavong (lov 05-06-2020)  Cardiologist : Dr Mertie Clause (lov 07-01-2020 epic, pt has follow up visit 07-09-2020 since bp meds changed last visit)  Chest x-ray : chest CT 12-29-2016 epic EKG : 07-01-2020 epic Echo : 05-27-2020 epic Stress test: 03-16-2016 epic Cardiac Cath :  no Activity level:  See above Sleep Study/ CPAP :  NO Fasting Blood Sugar :      / Checks Blood Sugar -- times a day:  n/a Blood Thinner/ Instructions Maryjane Hurter Dose: Eliquis ASA / Instructions/ Last Dose :  NO Per pt was given instructions/ clearance from cardiology to stop 48 hours prior to surgery

## 2020-07-07 NOTE — Progress Notes (Signed)
Cardiology Office Note    Date:  07/09/2020   ID:  JADIN CREQUE, DOB 20-Jan-1936, MRN [id number]  PCP:  [name], MD  Cardiologist:  [name], MD  Electrophysiologist:  None   Chief Complaint: Follow-up  History of Present Illness:   Zachary Green is a 85 y.o. male with history of persistent Afib on amiodarone and Eliquis s/p prior DCCV s/p repeat DCCV 06/07/2020 for recurrent Afib, aortic atherosclerosis, HFpEF/pulmonary hypertension, pulmonary nodules, BPH and prostate cancer s/p radiation therapy, HTN, HLD, prior tobacco use for 5 years in his 41s, and family history notable for Afib who presents for follow up of recent DCCV.   Echo in 2017 showed a normal LVSF, moderate MR/TR, and moderate pulmonary hypertension. He was treated with low dose Lasix with stable symptoms noted in follow up. Nuclear stress test in 2017 was normal.   In follow up in 11/2019, he was maintaining sinus rhythm.  He was seen in 04/2020 with recurrent palpitations, dizziness, fatigue, and increased SOB that dated back to 03/2020. He was noted to be in Afib with ventricular rate of 100 bpm. He was on amiodarone 100 mg daily and was reloaded followed by increased dose of maintenance dose of 200 mg daily. His Toprol was also increased to 25 mg daily. Echo on 05/27/2020 showed an EF of 60-65%, no RWMA, indeterminate LV diastolic function parameters, normal RVSF and ventricular cavity size, mildly elevated PASP at 36.2 mmHg, moderately dilated left atrium at 4.6 cm, and mild mitral regurgitation. When he was seen in follow up in 05/2020, he remained in Afib with controlled ventricular response and continued to note exertional dyspnea.   He underwent successful DCCV on 0/35/4656 without complication.   He is scheduled to undergo a transurethral bladder neck resection on 07/12/2020.  Initially, this procedure had to be postponed several days in an effort to allow him to complete 4 weeks of uninterrupted  anticoagulation following the above DCCV.  When he was seen in follow-up on 07/01/2020 he was noted to be in atrial flutter.  He noted persistent lightheadedness and fatigue which were largely unchanged when compared to his prior visits.  His case was discussed with his primary cardiologist, and given he had failed amiodarone this was discontinued.  Toprol-XL was titrated to 25 mg twice daily.  In an effort to prevent significant hypotension losartan was tapered to 50 mg.  He has been referred to EP for further management, at the request of his cardiologist.  He comes in today accompanied by his son.  With the titration of Toprol-XL for added heart rate control there has been a slight improvement in the patient's functional status though he does continue to note exertional dyspnea, fatigue, and lightheadedness.  Heart rates supplied from home have typically ran in the 70s to 80s with a reading in the 60s bpm.  BP has predominantly been in the 120s to 130s with a reading of 812 systolic.  He remains on Eliquis and has not missed any doses.  No falls, hematochezia, or melena.  He is scheduled to undergo bladder neck surgery on 4/18.  He understands he will need to hold Eliquis for 2 days prior to his procedure.   Labs independently reviewed: 05/2020 - HGB 13.8, PLT 190, potassium 4.2, BUN 18, SCr 1.17, TC 184, TG 138, HDL 56, LDL 100, albumin 4.3, AST/ALT normal 02/2016 - TSH normal   Past Medical History:  Diagnosis Date  . Actinic keratosis   . Arthritis   .  Bladder neck contracture   . BPH associated with nocturia   . Chronic diastolic CHF (congestive heart failure) [facility]) cardiologist--- dr a. muhammad   a. echo 12/17: 55-60%, no RWMA, trivial AI, moderate MR with systolic bowing without prolapse, moderate TR, mild biatrial enlargement, PASP 50 mmHg  . Diverticulosis of colon   . DOE (dyspnea on exertion)    07-06-2020  per pt sob with chores around the house, with yard work, stairs, and long  distances gets sob  . ED (erectile dysfunction)   . GERD (gastroesophageal reflux disease)   . History of basal cell carcinoma (BCC) excision    excision's ;   09/ 2015 left upper lateral chest;  09/ 2016  right lateral top shoulder;  10/ 2017 right anterior clavical area   . History of diverticulitis of colon   . History of gastric polyp    followed by dr m. Guadlupe Spanish ([facility])---  previous adenomatous low grade  . History of urinary retention   . Hyperlipidemia   . Hypertension    followed by pcp and cardiology  . Light-headed feeling    07-06-2020  per pt when he goes in and out of afib get light headed  . Macular degeneration, right eye   . Multiple pulmonary nodules    followed by pcp  . Persistent atrial fibrillation [facility]) followed by cardiology--- dr a. Rogue Jury -- first dx 12/ 2017   a. on Eliquis, amiodarone, Toprol; b. s/p successful dccv 03/28/16  and 06-07-2020; c. CHADS2VASc at least 3 (HTN, age x 2)  . Personal history of colonic adenoma 05/23/2007   04/2007 - 6 mm adenoma  . Prostate cancer [facility]) urologist--- dr Diona Fanti   dx 2017 ,  post HIFU ablative therapy 07/ 2017 and completed external radiation 04/ 2019  . Rosacea    followed by dr d. Nehemiah Massed (dermotology)  . Schatzki's ring    grade 1 varices  . Wears dentures    upper  . Wears glasses     Past Surgical History:  Procedure Laterality Date  . CARDIOVERSION N/A 06/07/2020   Procedure: CARDIOVERSION;  Surgeon: Wellington Hampshire, MD;  Location: [facility] ORS;  Service: Cardiovascular;  Laterality: N/A;  . CATARACT EXTRACTION W/ INTRAOCULAR LENS  IMPLANT, BILATERAL  2010 ;  2011  . COLONOSCOPY  last one 2014  @[facility]   . ELECTROPHYSIOLOGIC STUDY N/A 03/28/2016   Procedure: CARDIOVERSION;  Surgeon: Minna Merritts, MD;  Location: [facility] ORS;  Service: Cardiovascular;  Laterality: N/A;  . ESOPHAGOGASTRODUODENOSCOPY  last one 03-04-2018  @[facility]   . ESOPHAGOGASTRODUODENOSCOPY (EGD) WITH PROPOFOL N/A 08/18/2016   Procedure:  ESOPHAGOGASTRODUODENOSCOPY (EGD) WITH PROPOFOL;  Surgeon: Lollie Sails, MD;  Location: [facility] ENDOSCOPY;  Service: Endoscopy;  Laterality: N/A;  . GREEN LIGHT LASER TURP (TRANSURETHRAL RESECTION OF PROSTATE N/A 08/17/2015   Procedure: GREEN LIGHT LASER TURP (TRANSURETHRAL RESECTION OF PROSTATE;  Surgeon: Royston Cowper, MD;  Location: [facility] ORS;  Service: Urology;  Laterality: N/A;  . NM MYOVIEW LTD    . PATELLA FRACTURE SURGERY  1984   pin placed  . TONSILLECTOMY  1949  . UPPER ESOPHAGEAL ENDOSCOPIC ULTRASOUND (EUS) N/A 09/21/2016   Procedure: UPPER ESOPHAGEAL ENDOSCOPIC ULTRASOUND (EUS);  Surgeon: Reita Cliche, MD;  Location[facility] END[facility]COPY;  Service: Gastroenterology;  Laterality: N/A;    Current Medications: Current Meds  Medication Sig  . Cholecalciferol (VITAMIN D3) 50 MCG (2000 UT) TABS Take 2,000 Units by mouth daily after supper.  . diclofenac Sodium (VOLTAREN) 1 % GEL Apply  1 application topically 4 (four) times daily as needed (pain).  Marland Kitchen docusate sodium (COLACE) 100 MG capsule Take 100 mg by mouth 2 (two) times daily as needed (constipation).  Marland Kitchen doxycycline (VIBRAMYCIN) 50 MG capsule 1 CAPSULE BY MOUTH DAILY TAKE WITH FOOD AND PLENTY OF FLUID  . ELIQUIS 5 MG TABS tablet TAKE 1 TABLET BY MOUTH TWICE A DAY  . esomeprazole (NEXIUM) 20 MG capsule Take 20 mg by mouth daily before breakfast.  . furosemide (LASIX) 20 MG tablet TAKE 1 TABLET BY MOUTH EVERY DAY  . losartan (COZAAR) 25 MG tablet Take 1 tablet (25 mg total) by mouth daily.  . metroNIDAZOLE (METROGEL) 1 % gel APPLY 1 GRAM ON THE SKIN AT BEDTIME  . mupirocin ointment (BACTROBAN) 2 % 1 application as needed.  . Omega-3 Fatty Acids (FISH OIL) 1000 MG CAPS Take 1,000 mg by mouth in the morning and at bedtime.  . permethrin (ELIMITE) 5 % cream Apply 1 application topically 2 (two) times daily. Applied to cheeks  . Polyethyl Glycol-Propyl Glycol (SYSTANE OP) Place 1 drop into both eyes 2 (two) times daily as needed  (scheduled in the morning & in the evening if needed for dry/irritated eyes.).  Marland Kitchen pravastatin (PRAVACHOL) 40 MG tablet TAKE ONE TABLET BY MOUTH EVERY DAY  . tamsulosin (FLOMAX) 0.4 MG CAPS capsule Take 1 capsule (0.4 mg total) by mouth daily after supper.  . vitamin E 180 MG (400 UNITS) capsule Take 400 Units by mouth in the morning.  . [DISCONTINUED] losartan (COZAAR) 50 MG tablet Take 1 tablet (50 mg total) by mouth daily.  . [DISCONTINUED] metoprolol succinate (TOPROL-XL) 25 MG 24 hr tablet Take 1 tablet (25 mg total) by mouth in the morning and at bedtime.    Allergies:   Codeine   Social History   Socioeconomic History  . Marital status: Married    Spouse name: Katharine Look  . Number of children: 4  . Years of education: Not on file  . Highest education level: Not on file  Occupational History  . Occupation: retired Teacher, music: retired  Tobacco Use  . Smoking status: Former Smoker    Packs/day: 1.00    Years: 3.00    Pack years: 3.00    Types: Cigarettes    Quit date: 03/28/1959    Years since quitting: 61.3  . Smokeless tobacco: Former Systems developer    Types: Chew    Quit date: 07/06/1968  Vaping Use  . Vaping Use: Never used  Substance and Sexual Activity  . Alcohol use: No  . Drug use: Never  . Sexual activity: Not on file  Other Topics Concern  . Not on file  Social History Narrative   Has living will   Wife is [place] health care POA (then oldest son, Sherren Mocha)   Not sure about DNR---will accept resuscitation for now   Probably would not want a feeding tube   Social Determinants of Health   Financial Resource Strain: Not on file  Food Insecurity: Not on file  Transportation Needs: Not on file  Physical Activity: Not on file  Stress: Not on file  Social Connections: Not on file     Family History:  The patient's family history includes Alzheimer's disease in his mother; Cancer in his mother; Diabetes in his brother; Heart disease in his brother; Lung cancer in his  brother.  ROS:   Review of Systems  Constitutional: Positive for malaise/fatigue. Negative for chills, diaphoresis, fever and weight loss.  HENT: Negative for congestion.   Eyes: Negative for discharge and redness.  Respiratory: Positive for shortness of breath. Negative for cough, sputum production and wheezing.   Cardiovascular: Negative for chest pain, palpitations, orthopnea, claudication, leg swelling and PND.  Gastrointestinal: Negative for abdominal pain, blood in stool, heartburn, melena, nausea and vomiting.  Musculoskeletal: Negative for falls and myalgias.  Skin: Negative for rash.  Neurological: Positive for dizziness and weakness. Negative for tingling, tremors, sensory change, speech change, focal weakness and loss of consciousness.  Endo/Heme/Allergies: Does not bruise/bleed easily.  Psychiatric/Behavioral: Negative for substance abuse. The patient is not nervous/anxious.   All other systems reviewed and are negative.    EKGs/Labs/Other Studies Reviewed:    Studies reviewed were summarized above. The additional studies were reviewed today:  2D echo 05/27/2020: 1. Left ventricular ejection fraction, by estimation, is 60 to 65%. The  left ventricle has normal function. The left ventricle has no regional  wall motion abnormalities. There is moderate left ventricular hypertrophy.  Left ventricular diastolic  parameters are indeterminate.  2. Right ventricular systolic function is normal. The right ventricular  size is normal. There is mildly elevated pulmonary artery systolic  pressure.  3. Left atrial size was moderately dilated.  4. The mitral valve is normal in structure. Mild mitral valve  Regurgitation. __________  Carlton Adam MPI 03/16/2016:  There was no ST segment deviation noted during stress.  The study is normal.  This is a low risk study.  The left ventricular ejection fraction is normal (55-65%). __________  2D echo 03/09/2016: - Left  ventricle: The cavity size was normal. There was moderate  concentric hypertrophy. Systolic function was normal. The  estimated ejection fraction was in the range of 55% to 60%. Wall  motion was normal; there were no regional wall motion  abnormalities.  - Aortic valve: There was trivial regurgitation.  - Mitral valve: Systolic bowing without prolapse. There was  moderate regurgitation.  - Left atrium: The atrium was mildly dilated.  - Right atrium: The atrium was mildly dilated.  - Tricuspid valve: There was moderate regurgitation.  - Pulmonary arteries: Systolic pressure was moderately increased.  PA peak pressure: 50 mm Hg (S).   EKG:  EKG is ordered today.  The EKG ordered today demonstrates A. fib, 74 bpm, no acute ST-T changes  Recent Labs: 06/03/2020: BUN 18; Creatinine, Ser 1.17; Hemoglobin 13.8; Platelets 190; Potassium 4.2; Sodium 137  Recent Lipid Panel    Component Value Date/Time   CHOL 181 11/22/2010 1012   TRIG 82.0 11/22/2010 1012   HDL 54.40 11/22/2010 1012   CHOLHDL 3 11/22/2010 1012   VLDL 16.4 11/22/2010 1012   LDLCALC 110 (H) 11/22/2010 1012   LDLDIRECT 126.2 05/01/2007 0954    PHYSICAL EXAM:    VS:  BP 120/70 (BP Location: Left Arm, Patient Position: Sitting, Cuff Size: Normal)   Pulse 74   Ht 5\' 7"  (1.702 m)   Wt 214 lb 2 oz (97.1 kg)   SpO2 94%   BMI 33.54 kg/m   BMI: Body mass index is 33.54 kg/m.  Physical Exam Vitals reviewed.  Constitutional:      Appearance: He is well-developed.  HENT:     Head: Normocephalic and atraumatic.  Eyes:     General:        Right eye: No discharge.        Left eye: No discharge.  Neck:     Vascular: No JVD.  Cardiovascular:     Rate and Rhythm:  Normal rate. Rhythm irregularly irregular.     Pulses: No midsystolic click and no opening snap.          Posterior tibial pulses are 2+ on the right side and 2+ on the left side.     Heart sounds: Normal heart sounds, S1 normal and S2 normal. Heart  sounds not distant. No murmur heard. No friction rub.  Pulmonary:     Effort: Pulmonary effort is normal. No respiratory distress.     Breath sounds: Normal breath sounds. No decreased breath sounds, wheezing or rales.  Chest:     Chest wall: No tenderness.  Abdominal:     General: There is no distension.     Palpations: Abdomen is soft.     Tenderness: There is no abdominal tenderness.  Musculoskeletal:     Cervical back: Normal range of motion.  Skin:    General: Skin is warm and dry.     Nails: There is no clubbing.  Neurological:     Mental Status: He is alert and oriented to person, place, and time.  Psychiatric:        Speech: Speech normal.        Behavior: Behavior normal.        Thought Content: Thought content normal.        Judgment: Judgment normal.     Wt Readings from Last 3 Encounters:  07/09/20 214 lb 2 oz (97.1 kg)  07/01/20 212 lb (96.2 kg)  06/07/20 212 lb (96.2 kg)     ASSESSMENT & PLAN:   1. Persistent A. Fib/flutter: He remains in Afib with controlled ventricular response following briefly successful DCCV in 05/2020.  For added rate control, we will titrate Toprol-XL to 37.5 mg twice daily.  In an effort to prevent significant hypotension we will further decrease his losartan to 25 mg daily.  As outlined in his visit on 4/7, after discussion with his primary cardiologist, he has been referred to EP for consideration of Tikosyn with this appointment being in early 07/2020.  Given a CHA2DS2-VASc at least 5 (CHF, HTN, age x2, vascular disease) he remains on Eliquis without any symptoms concerning for bleeding and a recent stable CBC.  2. Exertional dyspnea: Possibly in the setting of poorly controlled ventricular rates as symptoms seem to have somewhat improved with added rate control with titration of Toprol-XL.  We will further titrate Toprol as outlined above.  Schedule Lexiscan MPI to evaluate for high risk ischemia.  3. HFpEF with pulmonary hypertension:  Recent echo demonstrated preserved LV systolic function with stable mild mitral regurgitation and improved PASP.  He remains on a low-dose furosemide.  4. Aortic atherosclerosis/HLD: LDL of 100 in 05/2020.  Currently on pravastatin.   5. HTN: Blood pressure is well controlled in the office today.  Continue medications as outlined above.   5. Preoperative cardiac risk stratification: He is scheduled to undergo a transurethral bladder neck resection on 07/12/2020.  Per Revised Cardiac Risk Index, he is low risk for noncardiac procedure.  Per [facility] Activity Status Index, he is able to achieve > 4 METs without cardiac limitation.  He may proceed with noncardiac procedure at an overall low risk.  He should hold Eliquis 48 hours prior to procedure, with a procedure date that is currently scheduled for 07/12/2020, he will have completed 4 weeks of uninterrupted anticoagulation prior to the holding of Eliquis.  Eliquis should be resumed as soon as safely possible post procedure per his surgical team  6. Pulmonary nodules: Followed by PCP  Disposition: F/u with Dr. Fletcher Anon or an APP in 6 weeks.   Medication Adjustments/Labs and Tests Ordered: Current medicines are reviewed at length with the patient today.  Concerns regarding medicines are outlined above. Medication changes, Labs and Tests ordered today are summarized above and listed in the Patient Instructions accessible in Encounters.   Signed, [name], PA-C 07/09/2020 12:49 PM     [address] [place], [place] [address] [phone]

## 2020-07-08 ENCOUNTER — Other Ambulatory Visit
Admission: RE | Admit: 2020-07-08 | Discharge: 2020-07-08 | Disposition: A | Discharge status: Home / Self Care | Payer: Medicare HMO | Source: Ambulatory Visit | Attending: Urology | Admitting: Urology

## 2020-07-08 ENCOUNTER — Other Ambulatory Visit: Payer: Self-pay

## 2020-07-08 DIAGNOSIS — Z01812 Encounter for preprocedural laboratory examination: Secondary | ICD-10-CM | POA: Insufficient documentation

## 2020-07-08 DIAGNOSIS — Z20822 Contact with and (suspected) exposure to covid-19: Secondary | ICD-10-CM | POA: Diagnosis not present

## 2020-07-08 LAB — SARS CORONAVIRUS 2 (TAT 6-24 HRS): SARS Coronavirus 2: NEGATIVE

## 2020-07-09 ENCOUNTER — Ambulatory Visit (INDEPENDENT_AMBULATORY_CARE_PROVIDER_SITE_OTHER): Payer: Medicare HMO | Admitting: Physician Assistant

## 2020-07-09 ENCOUNTER — Encounter: Payer: Self-pay | Admitting: Physician Assistant

## 2020-07-09 VITALS — BP 120/70 | HR 74 | Ht 67.0 in | Wt 214.1 lb

## 2020-07-09 DIAGNOSIS — I10 Essential (primary) hypertension: Secondary | ICD-10-CM | POA: Diagnosis not present

## 2020-07-09 DIAGNOSIS — R0609 Other forms of dyspnea: Secondary | ICD-10-CM

## 2020-07-09 DIAGNOSIS — E785 Hyperlipidemia, unspecified: Secondary | ICD-10-CM | POA: Diagnosis not present

## 2020-07-09 DIAGNOSIS — R0600 Dyspnea, unspecified: Secondary | ICD-10-CM

## 2020-07-09 DIAGNOSIS — I4819 Other persistent atrial fibrillation: Secondary | ICD-10-CM | POA: Diagnosis not present

## 2020-07-09 DIAGNOSIS — I700 Atherosclerosis of aorta: Secondary | ICD-10-CM

## 2020-07-09 DIAGNOSIS — I5032 Chronic diastolic (congestive) heart failure: Secondary | ICD-10-CM | POA: Diagnosis not present

## 2020-07-09 DIAGNOSIS — I272 Pulmonary hypertension, unspecified: Secondary | ICD-10-CM

## 2020-07-09 DIAGNOSIS — Z01810 Encounter for preprocedural cardiovascular examination: Secondary | ICD-10-CM

## 2020-07-09 DIAGNOSIS — R918 Other nonspecific abnormal finding of lung field: Secondary | ICD-10-CM

## 2020-07-09 MED ORDER — METOPROLOL SUCCINATE ER 25 MG PO TB24: ORAL_TABLET | ORAL | 6 refills | Status: DC

## 2020-07-09 MED ORDER — LOSARTAN POTASSIUM 25 MG PO TABS: 25.0000 mg | ORAL_TABLET | Freq: Every day | ORAL | 6 refills | Status: DC

## 2020-07-09 NOTE — Patient Instructions (Addendum)
Medication Instructions:  - Your physician has recommended you make the following change in your medication:   1) DECREASE losartan to 25 mg- take 1 tablet by mouth once daily  2) INCREASE metoprolol succinate 25 mg- take 1.5 tablets (37.5 mg) by mouth TWICE daily   *If you need a refill on your cardiac medications before your next appointment, please call your pharmacy*   Lab Work: - none ordered  If you have labs (blood work) drawn today and your tests are completely normal, you will receive your results only by: Marland Kitchen MyChart Message (if you have MyChart) OR . A paper copy in the mail If you have any lab test that is abnormal or we need to change your treatment, we will call you to review the results.   Testing/Procedures:  1) Lexiscan Myoview (Chemical Stress Test) Your physician has requested that you have a lexiscan myoview.   [place]  Your caregiver has ordered a Stress Test with nuclear imaging. The purpose of this test is to evaluate the blood supply to your heart muscle. This procedure is referred to as a "Non-Invasive Stress Test." This is because other than having an IV started in your vein, nothing is inserted or "invades" your body. Cardiac stress tests are done to find areas of poor blood flow to the heart by determining the extent of coronary artery disease (CAD). Some patients exercise on a treadmill, which naturally increases the blood flow to your heart, while others who are  unable to walk on a treadmill due to physical limitations have a pharmacologic/chemical stress agent called Lexiscan . This medicine will mimic walking on a treadmill by temporarily increasing your coronary blood flow.   Please note: these test may take anywhere between 2-4 hours to complete  PLEASE REPORT TO [place] AT THE FIRST DESK WILL DIRECT YOU WHERE TO GO  Date of Procedure:_____________________________________  Arrival Time for  Procedure:______________________________  Instructions regarding medication:   _x___ : You may take all of your regular medications the morning of your procedure with enough water to get them down safely unless listed below  __x__:  Hold betablocker(s) night before procedure and morning of procedure (METOPROLOL SUCCINATE)  __x__:  Hold other LASIX (FUROSEMIDE) the morning of your procedure    PLEASE NOTIFY THE OFFICE AT LEAST 24 HOURS IN ADVANCE IF YOU ARE UNABLE TO KEEP YOUR APPOINTMENT.  [phone] AND  PLEASE NOTIFY NUCLEAR MEDICINE AT [facility] AT LEAST 24 HOURS IN ADVANCE IF YOU ARE UNABLE TO KEEP YOUR APPOINTMENT. [phone]  How to prepare for your Myoview test:  1. Do not eat or drink after midnight 2. No caffeine for 24 hours prior to test 3. No smoking 24 hours prior to test. 4. Your medication may be taken with water.  If your doctor stopped a medication because of this test, do not take that medication. 5. Ladies, please do not wear dresses.  Skirts or pants are appropriate. Please wear a short sleeve shirt. 6. No perfume, cologne or lotion. 7. Wear comfortable walking shoes. No heels!   Follow-Up: At [facility], you and your health needs are our priority.  As part of our continuing mission to provide you with exceptional heart care, we have created designated Provider Care Teams.  These Care Teams include your primary Cardiologist (physician) and Advanced Practice Providers (APPs -  Physician Assistants and Nurse Practitioners) who all work together to provide you with the care you need, when you need  it.  We recommend signing up for the patient portal called "MyChart".  Sign up information is provided on this After Visit Summary.  MyChart is used to connect with patients for Virtual Visits (Telemedicine).  Patients are able to view lab/test results, encounter notes, upcoming appointments, etc.  Non-urgent messages can be sent to your provider as well.   To learn more  about what you can do with MyChart, go to [email or web address].    Your next appointment:   6 week(s)  The format for your next appointment:   In Person  Provider:   You may see [name], MD or one of the following Advanced Practice Providers on your designated Care Team:    Murray Hodgkins, NP  [name], PA-C  Marrianne Mood, PA-C  Cadence Kathlen Mody, [place]  Laurann [place], NP    Other Instructions   Cardiac Nuclear Scan A cardiac nuclear scan is a test that is done to check the flow of blood to your heart. It is done when you are resting and when you are exercising. The test looks for problems such as:  Not enough blood reaching a portion of the heart.  The heart muscle not working as it should. You may need this test if:  You have heart disease.  You have had lab results that are not normal.  You have had heart surgery or a balloon procedure to open up blocked arteries (angioplasty).  You have chest pain.  You have shortness of breath. In this test, a special dye (tracer) is put into your bloodstream. The tracer will travel to your heart. A camera will then take pictures of your heart to see how the tracer moves through your heart. This test is usually done at a hospital and takes 2-4 hours. Tell a doctor about:  Any allergies you have.  All medicines you are taking, including vitamins, herbs, eye drops, creams, and over-the-counter medicines.  Any problems you or family members have had with anesthetic medicines.  Any blood disorders you have.  Any surgeries you have had.  Any medical conditions you have.  Whether you are pregnant or may be pregnant. What are the risks? Generally, this is a safe test. However, problems may occur, such as:  Serious chest pain and heart attack. This is only a risk if the stress portion of the test is done.  Rapid heartbeat.  A feeling of warmth in your chest. This feeling usually does not last  long.  Allergic reaction to the tracer. What happens before the test?  Ask your doctor about changing or stopping your normal medicines. This is important.  Follow instructions from your doctor about what you cannot eat or drink.  Remove your jewelry on the day of the test. What happens during the test?  An IV tube will be inserted into one of your veins.  Your doctor will give you a small amount of tracer through the IV tube.  You will wait for 20-40 minutes while the tracer moves through your bloodstream.  Your heart will be monitored with an electrocardiogram (ECG).  You will lie down on an exam table.  Pictures of your heart will be taken for about 15-20 minutes.  You may also have a stress test. For this test, one of these things may be done: ? You will be asked to exercise on a treadmill or a stationary bike. ? You will be given medicines that will make your heart work harder. This is done if you are  unable to exercise.  When blood flow to your heart has peaked, a tracer will again be given through the IV tube.  After 20-40 minutes, you will get back on the exam table. More pictures will be taken of your heart.  Depending on the tracer that is used, more pictures may need to be taken 3-4 hours later.  Your IV tube will be removed when the test is over. The test may vary among doctors and hospitals. What happens after the test?  Ask your doctor: ? Whether you can return to your normal schedule, including diet, activities, and medicines. ? Whether you should drink more fluids. This will help to remove the tracer from your body. Drink enough fluid to keep your pee (urine) pale yellow.  Ask your doctor, or the department that is doing the test: ? When will my results be ready? ? How will I get my results? Summary  A cardiac nuclear scan is a test that is done to check the flow of blood to your heart.  Tell your doctor whether you are pregnant or may be  pregnant.  Before the test, ask your doctor about changing or stopping your normal medicines. This is important.  Ask your doctor whether you can return to your normal activities. You may be asked to drink more fluids. This information is not intended to replace advice given to you by your health care provider. Make sure you discuss any questions you have with your health care provider. Document Revised: 07/03/2018 Document Reviewed: 08/27/2017 Elsevier Patient Education  [place].

## 2020-07-12 ENCOUNTER — Encounter (HOSPITAL_BASED_OUTPATIENT_CLINIC_OR_DEPARTMENT_OTHER)
Admission: RE | Disposition: A | Discharge status: Home / Self Care | Payer: Self-pay | Source: Home / Self Care | Attending: Urology

## 2020-07-12 ENCOUNTER — Ambulatory Visit (HOSPITAL_BASED_OUTPATIENT_CLINIC_OR_DEPARTMENT_OTHER): Payer: Medicare HMO | Admitting: Anesthesiology

## 2020-07-12 ENCOUNTER — Other Ambulatory Visit: Payer: Self-pay

## 2020-07-12 ENCOUNTER — Encounter (HOSPITAL_BASED_OUTPATIENT_CLINIC_OR_DEPARTMENT_OTHER): Payer: Self-pay | Admitting: Urology

## 2020-07-12 ENCOUNTER — Ambulatory Visit (HOSPITAL_BASED_OUTPATIENT_CLINIC_OR_DEPARTMENT_OTHER)
Admission: RE | Admit: 2020-07-12 | Discharge: 2020-07-12 | Disposition: A | Discharge status: Home / Self Care | Payer: Medicare HMO | Attending: Urology | Admitting: Urology

## 2020-07-12 DIAGNOSIS — Y842 Radiological procedure and radiotherapy as the cause of abnormal reaction of the patient, or of later complication, without mention of misadventure at the time of the procedure: Secondary | ICD-10-CM | POA: Insufficient documentation

## 2020-07-12 DIAGNOSIS — Z833 Family history of diabetes mellitus: Secondary | ICD-10-CM | POA: Insufficient documentation

## 2020-07-12 DIAGNOSIS — T8189XA Other complications of procedures, not elsewhere classified, initial encounter: Secondary | ICD-10-CM | POA: Insufficient documentation

## 2020-07-12 DIAGNOSIS — C61 Malignant neoplasm of prostate: Secondary | ICD-10-CM | POA: Diagnosis not present

## 2020-07-12 DIAGNOSIS — I5032 Chronic diastolic (congestive) heart failure: Secondary | ICD-10-CM | POA: Diagnosis not present

## 2020-07-12 DIAGNOSIS — Z8249 Family history of ischemic heart disease and other diseases of the circulatory system: Secondary | ICD-10-CM | POA: Insufficient documentation

## 2020-07-12 DIAGNOSIS — Z809 Family history of malignant neoplasm, unspecified: Secondary | ICD-10-CM | POA: Insufficient documentation

## 2020-07-12 DIAGNOSIS — Z87891 Personal history of nicotine dependence: Secondary | ICD-10-CM | POA: Insufficient documentation

## 2020-07-12 DIAGNOSIS — N323 Diverticulum of bladder: Secondary | ICD-10-CM | POA: Diagnosis not present

## 2020-07-12 DIAGNOSIS — Z7901 Long term (current) use of anticoagulants: Secondary | ICD-10-CM | POA: Insufficient documentation

## 2020-07-12 DIAGNOSIS — Z885 Allergy status to narcotic agent status: Secondary | ICD-10-CM | POA: Diagnosis not present

## 2020-07-12 DIAGNOSIS — Z801 Family history of malignant neoplasm of trachea, bronchus and lung: Secondary | ICD-10-CM | POA: Diagnosis not present

## 2020-07-12 DIAGNOSIS — N320 Bladder-neck obstruction: Secondary | ICD-10-CM | POA: Insufficient documentation

## 2020-07-12 DIAGNOSIS — K219 Gastro-esophageal reflux disease without esophagitis: Secondary | ICD-10-CM | POA: Insufficient documentation

## 2020-07-12 DIAGNOSIS — I110 Hypertensive heart disease with heart failure: Secondary | ICD-10-CM | POA: Insufficient documentation

## 2020-07-12 DIAGNOSIS — I4819 Other persistent atrial fibrillation: Secondary | ICD-10-CM | POA: Insufficient documentation

## 2020-07-12 HISTORY — DX: Dyspnea, unspecified: R06.00

## 2020-07-12 HISTORY — PX: TRANSURETHRAL RESECTION OF BLADDER NECK: SHX6196

## 2020-07-12 HISTORY — DX: Bladder-neck obstruction: N32.0

## 2020-07-12 HISTORY — DX: Dizziness and giddiness: R42

## 2020-07-12 HISTORY — DX: Personal history of other specified conditions: Z87.898

## 2020-07-12 HISTORY — DX: Presence of spectacles and contact lenses: Z97.3

## 2020-07-12 HISTORY — DX: Other forms of dyspnea: R06.09

## 2020-07-12 HISTORY — DX: Personal history of other malignant neoplasm of skin: Z98.890

## 2020-07-12 HISTORY — DX: Diverticulosis of large intestine without perforation or abscess without bleeding: K57.30

## 2020-07-12 HISTORY — DX: Unspecified macular degeneration: H35.30

## 2020-07-12 HISTORY — DX: Presence of dental prosthetic device (complete) (partial): Z97.2

## 2020-07-12 HISTORY — DX: Benign prostatic hyperplasia with lower urinary tract symptoms: N40.1

## 2020-07-12 HISTORY — DX: Other nonspecific abnormal finding of lung field: R91.8

## 2020-07-12 HISTORY — DX: Personal history of other diseases of the digestive system: Z87.19

## 2020-07-12 HISTORY — DX: Personal history of other malignant neoplasm of skin: Z85.828

## 2020-07-12 LAB — POCT I-STAT, CHEM 8
BUN: 25 mg/dL — ABNORMAL HIGH (ref 8–23)
Calcium, Ion: 1.26 mmol/L (ref 1.15–1.40)
Chloride: 102 mmol/L (ref 98–111)
Creatinine, Ser: 1.2 mg/dL (ref 0.61–1.24)
Glucose, Bld: 113 mg/dL — ABNORMAL HIGH (ref 70–99)
HCT: 39 % (ref 39.0–52.0)
Hemoglobin: 13.3 g/dL (ref 13.0–17.0)
Potassium: 4.5 mmol/L (ref 3.5–5.1)
Sodium: 139 mmol/L (ref 135–145)
TCO2: 26 mmol/L (ref 22–32)

## 2020-07-12 SURGERY — RESECTION, BLADDER NECK, TRANSURETHRAL
Anesthesia: General | Site: Urethra

## 2020-07-12 MED ORDER — FENTANYL CITRATE (PF) 100 MCG/2ML IJ SOLN
INTRAMUSCULAR | Status: DC | PRN
  Administered 2020-07-12 (×2): 50 ug via INTRAVENOUS

## 2020-07-12 MED ORDER — ONDANSETRON HCL 4 MG/2ML IJ SOLN
INTRAMUSCULAR | Status: DC | PRN
  Administered 2020-07-12: 4 mg via INTRAVENOUS

## 2020-07-12 MED ORDER — ACETAMINOPHEN 10 MG/ML IV SOLN: 1000.0000 mg | Freq: Once | INTRAVENOUS | Status: DC | PRN

## 2020-07-12 MED ORDER — PROPOFOL 10 MG/ML IV BOLUS
INTRAVENOUS | Status: AC
  Filled 2020-07-12: qty 40

## 2020-07-12 MED ORDER — ONDANSETRON HCL 4 MG/2ML IJ SOLN: 4.0000 mg | Freq: Once | INTRAMUSCULAR | Status: DC | PRN

## 2020-07-12 MED ORDER — CEPHALEXIN 500 MG PO CAPS: 500.0000 mg | ORAL_CAPSULE | Freq: Two times a day (BID) | ORAL | 0 refills | Status: AC

## 2020-07-12 MED ORDER — PHENYLEPHRINE 40 MCG/ML (10ML) SYRINGE FOR IV PUSH (FOR BLOOD PRESSURE SUPPORT)
PREFILLED_SYRINGE | INTRAVENOUS | Status: AC
  Filled 2020-07-12: qty 10

## 2020-07-12 MED ORDER — CEFAZOLIN SODIUM-DEXTROSE 2-4 GM/100ML-% IV SOLN
INTRAVENOUS | Status: AC
  Filled 2020-07-12: qty 100

## 2020-07-12 MED ORDER — PROPOFOL 10 MG/ML IV BOLUS
INTRAVENOUS | Status: DC | PRN
  Administered 2020-07-12: 160 mg via INTRAVENOUS

## 2020-07-12 MED ORDER — FENTANYL CITRATE (PF) 100 MCG/2ML IJ SOLN: 25.0000 ug | INTRAMUSCULAR | Status: DC | PRN

## 2020-07-12 MED ORDER — LACTATED RINGERS IV SOLN: INTRAVENOUS | Status: DC

## 2020-07-12 MED ORDER — PHENYLEPHRINE HCL (PRESSORS) 10 MG/ML IV SOLN
INTRAVENOUS | Status: DC | PRN
  Administered 2020-07-12 (×2): 40 ug via INTRAVENOUS

## 2020-07-12 MED ORDER — SODIUM CHLORIDE 0.9 % IR SOLN
Status: DC | PRN
  Administered 2020-07-12: 3000 mL

## 2020-07-12 MED ORDER — FENTANYL CITRATE (PF) 100 MCG/2ML IJ SOLN
INTRAMUSCULAR | Status: AC
  Filled 2020-07-12: qty 2

## 2020-07-12 MED ORDER — CEFAZOLIN SODIUM-DEXTROSE 2-4 GM/100ML-% IV SOLN
2.0000 g | INTRAVENOUS | Status: AC
  Administered 2020-07-12: 2 g via INTRAVENOUS

## 2020-07-12 MED ORDER — ONDANSETRON HCL 4 MG/2ML IJ SOLN
INTRAMUSCULAR | Status: AC
  Filled 2020-07-12: qty 2

## 2020-07-12 MED ORDER — LIDOCAINE HCL (CARDIAC) PF 100 MG/5ML IV SOSY
PREFILLED_SYRINGE | INTRAVENOUS | Status: DC | PRN
  Administered 2020-07-12: 80 mg via INTRAVENOUS

## 2020-07-12 MED ORDER — LIDOCAINE 2% (20 MG/ML) 5 ML SYRINGE
INTRAMUSCULAR | Status: AC
  Filled 2020-07-12: qty 5

## 2020-07-12 SURGICAL SUPPLY — 22 items
BAG DRAIN URO-CYSTO SKYTR STRL (DRAIN) ×2 IMPLANT
BAG DRN RND TRDRP ANRFLXCHMBR (UROLOGICAL SUPPLIES) ×1
BAG DRN UROCATH (DRAIN) ×1
BAG URINE DRAIN 2000ML AR STRL (UROLOGICAL SUPPLIES) ×1 IMPLANT
CATH FOLEY 2WAY SLVR  5CC 18FR (CATHETERS) ×2
CATH FOLEY 2WAY SLVR 5CC 18FR (CATHETERS) IMPLANT
CLOTH BEACON ORANGE TIMEOUT ST (SAFETY) ×2 IMPLANT
ELECT HOOK LOOP BIPOLAR (NEEDLE) ×1 IMPLANT
GLOVE SURG ENC MOIS LTX SZ8 (GLOVE) ×2 IMPLANT
GLOVE SURG UNDER POLY LF SZ7 (GLOVE) ×2 IMPLANT
GOWN STRL REUS W/TWL XL LVL3 (GOWN DISPOSABLE) ×3 IMPLANT
HOLDER FOLEY CATH W/STRAP (MISCELLANEOUS) ×1 IMPLANT
IV NS IRRIG 3000ML ARTHROMATIC (IV SOLUTION) ×1 IMPLANT
KIT TURNOVER CYSTO (KITS) ×2 IMPLANT
LOOP CUT BIPOLAR 24F LRG (ELECTROSURGICAL) IMPLANT
MANIFOLD NEPTUNE II (INSTRUMENTS) ×1 IMPLANT
PACK CYSTO (CUSTOM PROCEDURE TRAY) ×2 IMPLANT
SYR TOOMEY IRRIG 70ML (MISCELLANEOUS) ×2
SYRINGE TOOMEY IRRIG 70ML (MISCELLANEOUS) IMPLANT
TUBE CONNECTING 12X1/4 (SUCTIONS) ×1 IMPLANT
TUBING UROLOGY SET (TUBING) ×1 IMPLANT
Thermedx Urology Tubing ×1 IMPLANT

## 2020-07-12 NOTE — H&P (Signed)
H&P  Chief Complaint: Urethral stricture  History of Present Illness: 85 yo [name] presents form balloon dilation of a recurrent urethral stricture following HIFU (by Dr Rogers Blocker in [place]) as well as salvage EBRT for recurrent PCa.  Past Medical History:  Diagnosis Date  . Actinic keratosis   . Arthritis   . Bladder neck contracture   . BPH associated with nocturia   . Chronic diastolic CHF (congestive heart failure) [facility]) cardiologist--- dr a. muhammad   a. echo 12/Zachary: 55-60%, no RWMA, trivial AI, moderate MR with systolic bowing without prolapse, moderate TR, mild biatrial enlargement, PASP 50 mmHg  . Diverticulosis of colon   . DOE (dyspnea on exertion)    07-06-2020  per pt sob with chores around the house, with yard work, stairs, and long distances gets sob  . ED (erectile dysfunction)   . GERD (gastroesophageal reflux disease)   . History of basal cell carcinoma (BCC) excision    excision's ;   09/ 2015 left upper lateral chest;  09/ 2016  right lateral top shoulder;  10/ 2017 right anterior clavical area   . History of diverticulitis of colon   . History of gastric polyp    followed by dr m. Guadlupe Spanish (gi)---  previous adenomatous low grade  . History of urinary retention   . Hyperlipidemia   . Hypertension    followed by pcp and cardiology  . Light-headed feeling    07-06-2020  per pt when he goes in and out of afib get light headed  . Macular degeneration, right eye   . Multiple pulmonary nodules    followed by pcp  . Persistent atrial fibrillation [facility]) followed by cardiology--- dr a. Rogue Jury -- first dx 12/ 2017   a. on Eliquis, amiodarone, Toprol; b. s/p successful dccv 03/28/16  and 06-07-2020; c. CHADS2VASc at least 3 (HTN, age x 2)  . Personal history of colonic adenoma 05/23/2007   04/2007 - 6 mm adenoma  . Prostate cancer [facility]) urologist--- dr Diona Fanti   dx 2017 ,  post HIFU ablative therapy 07/ 2017 and completed external radiation 04/ 2019  . Rosacea    followed  by dr d. Nehemiah Massed (dermotology)  . Schatzki's ring    grade 1 varices  . Wears dentures    upper  . Wears glasses     Past Surgical History:  Procedure Laterality Date  . CARDIOVERSION N/A 06/07/2020   Procedure: CARDIOVERSION;  Surgeon: Wellington Hampshire, MD;  Location: [facility] ORS;  Service: Cardiovascular;  Laterality: N/A;  . CATARACT EXTRACTION W/ INTRAOCULAR LENS  IMPLANT, BILATERAL  2010 ;  2011  . COLONOSCOPY  last one 2014  @[facility]   . ELECTROPHYSIOLOGIC STUDY N/A 03/28/2016   Procedure: CARDIOVERSION;  Surgeon: Minna Merritts, MD;  Location: [facility] ORS;  Service: Cardiovascular;  Laterality: N/A;  . ESOPHAGOGASTRODUODENOSCOPY  last one 03-04-2018  @[facility]   . ESOPHAGOGASTRODUODENOSCOPY (EGD) WITH PROPOFOL N/A 08/18/2016   Procedure: ESOPHAGOGASTRODUODENOSCOPY (EGD) WITH PROPOFOL;  Surgeon: Lollie Sails, MD;  Location: [facility] ENDOSCOPY;  Service: Endoscopy;  Laterality: N/A;  . [name] LIGHT LASER TURP (TRANSURETHRAL RESECTION OF PROSTATE N/A 08/17/2015   Procedure: [name] LIGHT LASER TURP (TRANSURETHRAL RESECTION OF PROSTATE;  Surgeon: Royston Cowper, MD;  Location: [facility] ORS;  Service: Urology;  Laterality: N/A;  . NM MYOVIEW LTD    . PATELLA FRACTURE SURGERY  1984   pin placed  . TONSILLECTOMY  1949  . UPPER ESOPHAGEAL ENDOSCOPIC ULTRASOUND (EUS) N/A 09/21/2016   Procedure: UPPER ESOPHAGEAL ENDOSCOPIC  ULTRASOUND (EUS);  Surgeon: Reita Cliche, MD;  Location: [facility] ENDOSCOPY;  Service: Gastroenterology;  Laterality: N/A;    Home Medications:    Allergies:  Allergies  Allergen Reactions  . Codeine Nausea Only    Family History  Problem Relation Age of Onset  . Alzheimer's disease Mother   . Cancer Mother        Melanoma  . Diabetes Brother   . Lung cancer Brother   . Heart disease Brother     Social History:  reports that he quit smoking about 61 years ago. His smoking use included cigarettes. He has a 3.00 pack-year smoking history. He quit smokeless tobacco use about  52 years ago.  His smokeless tobacco use included chew. He reports that he does not drink alcohol and does not use drugs.  ROS: A complete review of systems was performed.  All systems are negative except for pertinent findings as noted.  Physical Exam:  Vital signs in last 24 hours: BP (!) 152/81   Pulse 72   Temp (!) 97.4 F (36.3 C) (Oral)   Resp 18   Ht 5\' 7"  (1.702 m)   Wt 97 kg   SpO2 100%   BMI 33.50 kg/m  Constitutional:  Alert and oriented, No acute distress Cardiovascular: Regular rate  Respiratory: Normal respiratory effort GI: Abdomen is soft, nontender, nondistended, no abdominal masses. No CVAT.  Genitourinary: Normal [name] phallus, testes are descended bilaterally and non-tender and without masses, scrotum is normal in appearance without lesions or masses, perineum is normal on inspection. Lymphatic: No lymphadenopathy Neurologic: Grossly intact, no focal deficits Psychiatric: Normal mood and affect  Laboratory Data:  No results for input(s): WBC, HGB, HCT, PLT in the last 72 hours.  No results for input(s): NA, K, CL, GLUCOSE, BUN, CALCIUM, CREATININE in the last 72 hours.  Invalid input(s): CO3   No results found for this or any previous visit (from the past 24 hour(s)). Recent Results (from the past 240 hour(s))  SARS CORONAVIRUS 2 (TAT 6-24 HRS) Nasopharyngeal Nasopharyngeal Swab     Status: None   Collection Time: 07/08/20 10:34 AM   Specimen: Nasopharyngeal Swab  Result Value Ref Range Status   SARS Coronavirus 2 NEGATIVE NEGATIVE Final    Comment: (NOTE) SARS-CoV-2 target nucleic acids are NOT DETECTED.  The SARS-CoV-2 RNA is generally detectable in upper and lower respiratory specimens during the acute phase of infection. Negative results do not preclude SARS-CoV-2 infection, do not rule out co-infections with other pathogens, and should not be used as the sole basis for treatment or other patient management decisions. Negative results must be  combined with clinical observations, patient history, and epidemiological information. The expected result is Negative.  Fact Sheet for Patients: SugarRoll.be  Fact Sheet for Healthcare Providers: https://www.woods-mathews.com/  This test is not yet approved or cleared by the [place] FDA and  has been authorized for detection and/or diagnosis of SARS-CoV-2 by FDA under an Emergency Use Authorization (EUA). This EUA will remain  in effect (meaning this test can be used) for the duration of the COVID-19 declaration under Se ction 564(b)(1) of the Act, 21 U.S.C. section 360bbb-3(b)(1), unless the authorization is terminated or revoked sooner.  Performed at [facility], [place] [address], [place], [place] [address]     Renal Function: No results for input(s): CREATININE in the last 168 hours. CrCl cannot be calculated (Patient's most recent lab result is older than the maximum 21 days allowed.).  Radiologic Imaging:  No results found.  Impression/Assessment:  Recurrent urethral stricture  Plan:  Cysto, balloon dilation of recurrent urethral stricture

## 2020-07-12 NOTE — Interval H&P Note (Signed)
History and Physical Interval Note:  07/12/2020 9:10 AM  Zachary [name]  has presented today for surgery, with the diagnosis of BLADDER NECT CONTRACTURE.  The various methods of treatment have been discussed with the patient and family. After consideration of risks, benefits and other options for treatment, the patient has consented to  Procedure(s) with comments: [employer] (N/A) - 30 MINS as a surgical intervention.  The patient's history has been reviewed, patient examined, no change in status, stable for surgery.  I have reviewed the patient's chart and labs.  Questions were answered to the patient's satisfaction.     Lillette Boxer Deano Tomaszewski

## 2020-07-12 NOTE — Anesthesia Preprocedure Evaluation (Signed)
Anesthesia Evaluation  Patient identified by MRN, date of birth, ID band Patient awake    Reviewed: Allergy & Precautions, NPO status , Patient's Chart, lab work & pertinent test results  Airway Mallampati: II  TM Distance: >3 FB Neck ROM: Full    Dental  (+) Upper Dentures   Pulmonary neg pulmonary ROS, former smoker,    Pulmonary exam normal        Cardiovascular hypertension, Pt. on medications and Pt. on home beta blockers +CHF  + dysrhythmias Atrial Fibrillation  Rhythm:Irregular Rate:Normal     Neuro/Psych negative neurological ROS  negative psych ROS   GI/Hepatic Neg liver ROS, GERD  Medicated,  Endo/Other  negative endocrine ROS  Renal/GU negative Renal ROS   Urethral stricture, Prostate Ca    Musculoskeletal  (+) Arthritis ,   Abdominal (+)  Abdomen: soft. Bowel sounds: normal.  Peds  Hematology negative hematology ROS (+)   Anesthesia Other Findings On Eliquis for AF, last taken 07/10/20  Reproductive/Obstetrics                             Anesthesia Physical Anesthesia Plan  ASA: III  Anesthesia Plan: General   Post-op Pain Management:    Induction: Intravenous  PONV Risk Score and Plan: 2 and Ondansetron, Dexamethasone and Treatment may vary due to age or medical condition  Airway Management Planned: Mask and LMA  Additional Equipment: None  Intra-op Plan:   Post-operative Plan: Extubation in OR  Informed Consent: I have reviewed the patients History and Physical, chart, labs and discussed the procedure including the risks, benefits and alternatives for the proposed anesthesia with the patient or authorized representative who has indicated his/her understanding and acceptance.     Dental advisory given  Plan Discussed with: CRNA  Anesthesia Plan Comments: (ECHO 03/22: 1. Left ventricular ejection fraction, by estimation, is 60 to 65%. The  left  ventricle has normal function. The left ventricle has no regional  wall motion abnormalities. There is moderate left ventricular hypertrophy.  Left ventricular diastolic  parameters are indeterminate.  2. Right ventricular systolic function is normal. The right ventricular  size is normal. There is mildly elevated pulmonary artery systolic  pressure.  3. Left atrial size was moderately dilated.  4. The mitral valve is normal in structure. Mild mitral valve  regurgitation.  Lab Results      Component                Value               Date                      WBC                      3.7                 06/03/2020                HGB                      13.3                07/12/2020                HCT  39.0                07/12/2020                MCV                      91                  06/03/2020                PLT                      190                 06/03/2020           Lab Results      Component                Value               Date                      NA                       139                 07/12/2020                K                        4.5                 07/12/2020                CO2                      21                  06/03/2020                GLUCOSE                  113 (H)             07/12/2020                BUN                      25 (H)              07/12/2020                CREATININE               1.20                07/12/2020                CALCIUM                  9.1                 06/03/2020                GFRNONAA                 78  05/05/2016                GFRAA                    91                  05/05/2016          )        Anesthesia Quick Evaluation

## 2020-07-12 NOTE — Op Note (Signed)
Preoperative diagnosis: Bladder neck contracture  Postoperative diagnosis: Same  Principal procedure: Transurethral incision of bladder neck contracture  Surgeon: Devian Bartolomei  Anesthesia: General with LMA  Complications: None  Estimated blood loss: None  Specimen: None  Drains: None  Indications: 85 year old male with prostate cancer.  He was initially treated in [facility] with high intensity focused ultrasound administered by Dr. Maryan Puls.  Unfortunately, he had persistence of his cancer and underwent external beam radiotherapy here in [place].  Prior to his radiation he did have bladder neck contracture which was treated.  He has had recurrent bladder neck contracture which have been persistent despite office dilatations.  He presents at this time for incision of bladder neck contracture.  I discussed the procedure with the patient who understands and desires to proceed.  Findings: Urethra was normal.  Prostate was nonobstructive.  There was a fairly tight bladder neck contracture, approximately 10 [place].  Once inside the bladder, the urothelium was normal.  There was a fairly large diverticulum in the right superior bladder.  There were mild trabeculations.  There was a large residual urine volume.  Description of procedure: The patient was properly identified in the holding area.  He received preoperative IV antibiotics.  Stick to the operating room where general anesthetic was administered with the LMA.  He was placed in the dorsolithotomy position.  Genitalia and perineum were prepped and draped, proper timeout performed.  Using the visual obturator, the resectoscope was passed to the bladder neck area.  The above-mentioned findings were noted in the urethra.  There was a very tight bladder neck contracture.  The [employer] and resectoscope were then placed.  The bladder neck contracture was incised in the 12 and 6 o'clock position.  At this point, it easily  admitted the 81 French obturator sheath.  The bladder was inspected circumferentially with the above-mentioned findings.  Because of the ease of administration of the resectoscope, I did not feel further dilatation with Leander Rams sounds was necessary.  Following inspection of the bladder, the scope was removed.  An 56 French Foley catheter was placed, balloon filled with 10 cc of water and this was hooked to dependent drainage.  At this point the procedure was terminated.  The patient was awakened, taken to the PACU in stable condition, having tolerated the procedure well.

## 2020-07-12 NOTE — Transfer of Care (Signed)
Immediate Anesthesia Transfer of Care Note  Patient: Zachary [name]  Procedure(s) Performed: Procedure(s) (LRB): TRANSURETHRAL RESECTION OF BLADDER NECK (N/A)  Patient Location: PACU  Anesthesia Type: General  Level of Consciousness: awake, sedated, patient cooperative and responds to stimulation  Airway & Oxygen Therapy: Patient Spontanous Breathing and Patient connected to [place] 02 and soft FM   Post-op Assessment: Report given to PACU RN, Post -op Vital signs reviewed and stable and Patient moving all extremities  Post vital signs: Reviewed and stable  Complications: No apparent anesthesia complications

## 2020-07-12 NOTE — Anesthesia Postprocedure Evaluation (Signed)
Anesthesia Post Note  Patient: Zachary [name]  Procedure(s) Performed: TRANSURETHRAL RESECTION OF BLADDER NECK (N/A Urethra)     Patient location during evaluation: PACU Anesthesia Type: General Level of consciousness: awake and alert Pain management: pain level controlled Vital Signs Assessment: post-procedure vital signs reviewed and stable Respiratory status: spontaneous breathing, nonlabored ventilation, respiratory function stable and patient connected to nasal cannula oxygen Cardiovascular status: blood pressure returned to baseline and stable Postop Assessment: no apparent nausea or vomiting Anesthetic complications: no   No complications documented.  Last Vitals:  Vitals:   07/12/20 1030 07/12/20 1127  BP: 109/79 121/71  Pulse: 64 93  Resp: 20   Temp:  36.7 C  SpO2: 98% 99%    Last Pain:  Vitals:   07/12/20 1127  TempSrc:   PainSc: 0-No pain                 Belenda Cruise P Teondre Jarosz

## 2020-07-12 NOTE — Anesthesia Procedure Notes (Signed)
Procedure Name: LMA Insertion Date/Time: 07/12/2020 9:21 AM Performed by: Justice Rocher, CRNA Pre-anesthesia Checklist: Patient identified, Emergency Drugs available, Suction available, Patient being monitored and Timeout performed Patient Re-evaluated:Patient Re-evaluated prior to induction Oxygen Delivery Method: Circle system utilized Preoxygenation: Pre-oxygenation with 100% oxygen Induction Type: IV induction Ventilation: Mask ventilation without difficulty LMA: LMA inserted LMA Size: 5.0 Number of attempts: 1 Airway Equipment and Method: Bite block Placement Confirmation: positive ETCO2,  breath sounds checked- equal and bilateral and CO2 detector Tube secured with: Tape Dental Injury: Teeth and Oropharynx as per pre-operative assessment

## 2020-07-12 NOTE — Discharge Instructions (Signed)
1. You may see some blood in the urine and may have some burning with urination for 48-72 hours. You also may notice that you have to urinate more frequently or urgently after your procedure which is normal.  2. You should call should you develop an inability urinate, fever > 101, persistent nausea and vomiting that prevents you from eating or drinking to stay hydrated.  3. If you have a catheter, you will be taught how to take care of the catheter by the nursing staff prior to discharge from the hospital.  You may periodically feel a strong urge to void with the catheter in place.  This is a bladder spasm and most often can occur when having a bowel movement or moving around. It is typically self-limited and usually will stop after a few minutes.  You may use some Vaseline or Neosporin around the tip of the catheter to reduce friction at the tip of the penis. You may also see some blood in the urine.  A very small amount of blood can make the urine look quite red.  As long as the catheter is draining well, there usually is not a problem.  However, if the catheter is not draining well and is bloody, you should call the office [phone]) to notify us.  It is okay to remove the catheter as demonstrated by the nurses on Tuesday morning.     Post Anesthesia Home Care Instructions  Activity: Get plenty of rest for the remainder of the day. A responsible individual must stay with you for 24 hours following the procedure.  For the next 24 hours, DO NOT: -Drive a car -Paediatric nurse -Drink alcoholic beverages -Take any medication unless instructed by your physician -Make any legal decisions or sign important papers.  Meals: Start with liquid foods such as gelatin or soup. Progress to regular foods as tolerated. Avoid greasy, spicy, heavy foods. If nausea and/or vomiting occur, drink only clear liquids until the nausea and/or vomiting subsides. Call your physician if vomiting continues.  Special  Instructions/Symptoms: Your throat may feel dry or sore from the anesthesia or the breathing tube placed in your throat during surgery. If this causes discomfort, gargle with warm salt water. The discomfort should disappear within 24 hours.  If you had a scopolamine patch placed behind your ear for the management of post- operative nausea and/or vomiting:  1. The medication in the patch is effective for 72 hours, after which it should be removed.  Wrap patch in a tissue and discard in the trash. Wash hands thoroughly with soap and water. 2. You may remove the patch earlier than 72 hours if you experience unpleasant side effects which may include dry mouth, dizziness or visual disturbances. 3. Avoid touching the patch. Wash your hands with soap and water after contact with the patch.

## 2020-07-13 ENCOUNTER — Encounter (HOSPITAL_BASED_OUTPATIENT_CLINIC_OR_DEPARTMENT_OTHER): Payer: Self-pay | Admitting: Urology

## 2020-07-19 ENCOUNTER — Other Ambulatory Visit: Payer: Self-pay

## 2020-07-19 ENCOUNTER — Encounter
Admission: RE | Admit: 2020-07-19 | Discharge: 2020-07-19 | Disposition: A | Discharge status: Home / Self Care | Payer: Medicare HMO | Source: Ambulatory Visit | Attending: Physician Assistant | Admitting: Physician Assistant

## 2020-07-19 DIAGNOSIS — R0600 Dyspnea, unspecified: Secondary | ICD-10-CM | POA: Diagnosis present

## 2020-07-19 DIAGNOSIS — R0609 Other forms of dyspnea: Secondary | ICD-10-CM

## 2020-07-19 LAB — NM MYOCAR MULTI W/SPECT W/WALL MOTION / EF
Estimated workload: 1 METS
Exercise duration (min): 0 min
Exercise duration (sec): 0 s
LV dias vol: 60 mL (ref 62–150)
LV sys vol: 16 mL
MPHR: 138 {beats}/min
Peak HR: 107 {beats}/min
Percent HR: 77 %
Rest HR: 77 {beats}/min
SDS: 2
SRS: 4
SSS: 3
TID: 1.17

## 2020-07-19 MED ORDER — REGADENOSON 0.4 MG/5ML IV SOLN
0.4000 mg | Freq: Once | INTRAVENOUS | Status: AC
  Administered 2020-07-19: 0.4 mg via INTRAVENOUS
  Filled 2020-07-19: qty 5

## 2020-07-19 MED ORDER — TECHNETIUM TC 99M TETROFOSMIN IV KIT
10.0000 | PACK | Freq: Once | INTRAVENOUS | Status: AC | PRN
  Administered 2020-07-19: 10.261 via INTRAVENOUS

## 2020-07-19 MED ORDER — TECHNETIUM TC 99M TETROFOSMIN IV KIT
30.0000 | PACK | Freq: Once | INTRAVENOUS | Status: AC | PRN
  Administered 2020-07-19: 31.992 via INTRAVENOUS

## 2020-07-24 ENCOUNTER — Emergency Department: Payer: Medicare HMO

## 2020-07-24 ENCOUNTER — Other Ambulatory Visit: Payer: Self-pay | Admitting: Physician Assistant

## 2020-07-24 ENCOUNTER — Other Ambulatory Visit: Payer: Self-pay

## 2020-07-24 ENCOUNTER — Emergency Department
Admission: EM | Admit: 2020-07-24 | Discharge: 2020-07-24 | Disposition: A | Discharge status: Home / Self Care | Payer: Medicare HMO | Attending: Emergency Medicine | Admitting: Emergency Medicine

## 2020-07-24 DIAGNOSIS — R42 Dizziness and giddiness: Secondary | ICD-10-CM

## 2020-07-24 DIAGNOSIS — Z87891 Personal history of nicotine dependence: Secondary | ICD-10-CM | POA: Diagnosis not present

## 2020-07-24 DIAGNOSIS — I4891 Unspecified atrial fibrillation: Secondary | ICD-10-CM

## 2020-07-24 DIAGNOSIS — Z7901 Long term (current) use of anticoagulants: Secondary | ICD-10-CM | POA: Diagnosis not present

## 2020-07-24 DIAGNOSIS — Z8546 Personal history of malignant neoplasm of prostate: Secondary | ICD-10-CM | POA: Diagnosis not present

## 2020-07-24 DIAGNOSIS — I110 Hypertensive heart disease with heart failure: Secondary | ICD-10-CM | POA: Diagnosis not present

## 2020-07-24 DIAGNOSIS — Z79899 Other long term (current) drug therapy: Secondary | ICD-10-CM | POA: Diagnosis not present

## 2020-07-24 DIAGNOSIS — R531 Weakness: Secondary | ICD-10-CM | POA: Diagnosis not present

## 2020-07-24 DIAGNOSIS — I5032 Chronic diastolic (congestive) heart failure: Secondary | ICD-10-CM | POA: Insufficient documentation

## 2020-07-24 LAB — CBC
HCT: 38.8 % — ABNORMAL LOW (ref 39.0–52.0)
Hemoglobin: 12.8 g/dL — ABNORMAL LOW (ref 13.0–17.0)
MCH: 31.1 pg (ref 26.0–34.0)
MCHC: 33 g/dL (ref 30.0–36.0)
MCV: 94.4 fL (ref 80.0–100.0)
Platelets: 170 10*3/uL (ref 150–400)
RBC: 4.11 MIL/uL — ABNORMAL LOW (ref 4.22–5.81)
RDW: 14.5 % (ref 11.5–15.5)
WBC: 4.3 10*3/uL (ref 4.0–10.5)
nRBC: 0 % (ref 0.0–0.2)

## 2020-07-24 LAB — BASIC METABOLIC PANEL
Anion gap: 8 (ref 5–15)
BUN: 18 mg/dL (ref 8–23)
CO2: 25 mmol/L (ref 22–32)
Calcium: 9 mg/dL (ref 8.9–10.3)
Chloride: 101 mmol/L (ref 98–111)
Creatinine, Ser: 1.08 mg/dL (ref 0.61–1.24)
GFR, Estimated: >60 mL/min (ref >60)
Glucose, Bld: 140 mg/dL — ABNORMAL HIGH (ref 70–99)
Potassium: 4.3 mmol/L (ref 3.5–5.1)
Sodium: 134 mmol/L — ABNORMAL LOW (ref 135–145)

## 2020-07-24 LAB — URINALYSIS, COMPLETE (UACMP) WITH MICROSCOPIC
Bacteria, UA: NONE SEEN
Bilirubin Urine: NEGATIVE
Glucose, UA: NEGATIVE mg/dL
Ketones, ur: NEGATIVE mg/dL
Leukocytes,Ua: NEGATIVE
Nitrite: NEGATIVE
Protein, ur: NEGATIVE mg/dL
RBC / HPF: >50 RBC/hpf — ABNORMAL HIGH (ref 0–5)
Specific Gravity, Urine: 1.006 (ref 1.005–1.030)
Squamous Epithelial / HPF: NONE SEEN (ref 0–5)
pH: 6 (ref 5.0–8.0)

## 2020-07-24 LAB — TROPONIN I (HIGH SENSITIVITY): Troponin I (High Sensitivity): 12 ng/L (ref <18)

## 2020-07-24 LAB — MAGNESIUM: Magnesium: 1.9 mg/dL (ref 1.7–2.4)

## 2020-07-24 NOTE — ED Notes (Signed)
Patient returned to ED. Family at bedside.

## 2020-07-24 NOTE — ED Triage Notes (Signed)
Pt to ER via [facility] from home with complaints of an episode of dizziness/ "feeling swimmy headed" today. Reports if he hadn't been close to the counter he would have fallen.   Pt reports hx of afib, and has been unable to get this controlled. Reports recent changes to his metoprolol and losartan. States he took his morning medications approx 1hr before episode of dizziness began.

## 2020-07-24 NOTE — ED Notes (Signed)
ED Provider at bedside. 

## 2020-07-24 NOTE — ED Provider Notes (Signed)
[facility] Emergency Department Provider Note  ____________________________________________   Event Date/Time   First MD Initiated Contact with Patient 07/24/20 1314     (approximate)  I have reviewed the triage vital signs and the nursing notes.   HISTORY  Chief Complaint Weakness   HPI FOUNT BAHE is a 85 y.o. male with a past medical history of A. fib on Eliquis and metoprolol, arthritis, BPH, CHF, HTN, HDL, and diverticulosis who presents for assessment of some dizziness.  Patient states he has had problems with his A. fib on and off over the last couple weeks and his CARDIOLOGIST to adjust his medicines and is not sure if that is what causing his dizziness.  He describes it as lightheadedness almost like vertigo although he is unable to clarify it further.  It is worsened whenever he attempts to stand and walk or move his head.  He has not had any headache, eye pain, vision changes, chest pain, cough, shortness of breath, Donnell pain, back pain, rash or focal extremity pain weakness numbness or tingling.  No recent falls or injuries.  States he is compliant with all his medicines.  States he has never been dizzy that he has felt today.         Past Medical History:  Diagnosis Date  . Actinic keratosis   . Arthritis   . Bladder neck contracture   . BPH associated with nocturia   . Chronic diastolic CHF (congestive heart failure) [facility]) cardiologist--- dr a. muhammad   a. echo 12/17: 55-60%, no RWMA, trivial AI, moderate MR with systolic bowing without prolapse, moderate TR, mild biatrial enlargement, PASP 50 mmHg  . Diverticulosis of colon   . DOE (dyspnea on exertion)    07-06-2020  per pt sob with chores around the house, with yard work, stairs, and long distances gets sob  . ED (erectile dysfunction)   . GERD (gastroesophageal reflux disease)   . History of basal cell carcinoma (BCC) excision    excision's ;   09/ 2015 left upper lateral  chest;  09/ 2016  right lateral top shoulder;  10/ 2017 right anterior clavical area   . History of diverticulitis of colon   . History of gastric polyp    followed by dr m. Guadlupe Spanish (gi)---  previous adenomatous low grade  . History of urinary retention   . Hyperlipidemia   . Hypertension    followed by pcp and cardiology  . Light-headed feeling    07-06-2020  per pt when he goes in and out of afib get light headed  . Macular degeneration, right eye   . Multiple pulmonary nodules    followed by pcp  . Persistent atrial fibrillation [name]) followed by cardiology--- dr a. Rogue Jury -- first dx 12/ 2017   a. on Eliquis, amiodarone, Toprol; b. s/p successful dccv 03/28/16  and 06-07-2020; c. CHADS2VASc at least 3 (HTN, age x 2)  . Personal history of colonic adenoma 05/23/2007   04/2007 - 6 mm adenoma  . Prostate cancer [facility]) urologist--- dr Diona Fanti   dx 2017 ,  post HIFU ablative therapy 07/ 2017 and completed external radiation 04/ 2019  . Rosacea    followed by dr d. Nehemiah Massed (dermotology)  . Schatzki's ring    grade 1 varices  . Wears dentures    upper  . Wears glasses     Patient Active Problem List   Diagnosis Date Noted  . Advanced nonexudative age-related macular degeneration of right  eye with subfoveal involvement 09/09/2019  . Nondiabetic proliferative retinopathy, right 09/09/2019  . Right epiretinal membrane 09/09/2019  . Malignant neoplasm of prostate ([place]) 04/05/2017  . Chronic diastolic CHF (congestive heart failure) ([place])   . Persistent atrial fibrillation ([place])   . Routine general medical examination at a health care facility 11/22/2010  . UNSPEC POLYARTHROPATHY/POLYARTHRITIS SITE UNSPEC 05/17/2010  . OSTEOARTHRITIS 10/29/2007  . IMPAIRED FASTING GLUCOSE 10/29/2007  . Personal history of colonic adenoma 05/23/2007  . BUNION, LEFT FOOT 11/22/2006  . SCHATZKI'S RING 11/16/2006  . HYPERLIPIDEMIA 11/12/2006  . ERECTILE DYSFUNCTION 11/12/2006  . Essential hypertension  11/12/2006  . DIVERTICULOSIS, COLON 11/12/2006  . BENIGN PROSTATIC HYPERTROPHY 11/12/2006    Past Surgical History:  Procedure Laterality Date  . CARDIOVERSION N/A 06/07/2020   Procedure: CARDIOVERSION;  Surgeon: Wellington Hampshire, MD;  Location: [facility] ORS;  Service: Cardiovascular;  Laterality: N/A;  . CATARACT EXTRACTION W/ INTRAOCULAR LENS  IMPLANT, BILATERAL  2010 ;  2011  . COLONOSCOPY  last one 2014  @[facility]   . ELECTROPHYSIOLOGIC STUDY N/A 03/28/2016   Procedure: CARDIOVERSION;  Surgeon: Minna Merritts, MD;  Location: [facility] ORS;  Service: Cardiovascular;  Laterality: N/A;  . ESOPHAGOGASTRODUODENOSCOPY  last one 03-04-2018  @[facility]   . ESOPHAGOGASTRODUODENOSCOPY (EGD) WITH PROPOFOL N/A 08/18/2016   Procedure: ESOPHAGOGASTRODUODENOSCOPY (EGD) WITH PROPOFOL;  Surgeon: Lollie Sails, MD;  Location: [facility] ENDOSCOPY;  Service: Endoscopy;  Laterality: N/A;  . GREEN LIGHT LASER TURP (TRANSURETHRAL RESECTION OF PROSTATE N/A 08/17/2015   Procedure: GREEN LIGHT LASER TURP (TRANSURETHRAL RESECTION OF PROSTATE;  Surgeon: Royston Cowper, MD;  Location: [facility] ORS;  Service: Urology;  Laterality: N/A;  . NM MYOVIEW LTD    . PATELLA FRACTURE SURGERY  1984   pin placed  . TONSILLECTOMY  1949  . TRANSURETHRAL RESECTION OF BLADDER NECK N/A 07/12/2020   Procedure: TRANSURETHRAL RESECTION OF BLADDER NECK;  Surgeon: Franchot Gallo, MD;  Location: [facility];  Service: Urology;  Laterality: N/A;  . UPPER ESOPHAGEAL ENDOSCOPIC ULTRASOUND (EUS) N/A 09/21/2016   Procedure: UPPER ESOPHAGEAL ENDOSCOPIC ULTRASOUND (EUS);  Surgeon: Reita Cliche, MD;  Location: [facility] ENDOSCOPY;  Service: Gastroenterology;  Laterality: N/A;    Prior to Admission medications   Medication Sig Start Date End Date Taking? Authorizing Provider  Cholecalciferol (VITAMIN D3) 50 MCG (2000 UT) TABS Take 2,000 Units by mouth daily after supper.    [provider]  diclofenac Sodium (VOLTAREN) 1 % GEL Apply 1  application topically 4 (four) times daily as needed (pain).    [provider]  docusate sodium (COLACE) 100 MG capsule Take 100 mg by mouth 2 (two) times daily as needed (constipation).    [provider]  doxycycline (VIBRAMYCIN) 50 MG capsule 1 CAPSULE BY MOUTH DAILY TAKE WITH FOOD AND PLENTY OF FLUID 09/11/19   Ralene Bathe, MD  ELIQUIS 5 MG TABS tablet TAKE 1 TABLET BY MOUTH TWICE A DAY 01/26/20   Wellington Hampshire, MD  esomeprazole (NEXIUM) 20 MG capsule Take 20 mg by mouth daily before breakfast.    [provider]  furosemide (LASIX) 20 MG tablet TAKE 1 TABLET BY MOUTH EVERY DAY 12/08/19   Wellington Hampshire, MD  losartan (COZAAR) 25 MG tablet Take 1 tablet (25 mg total) by mouth daily. 07/09/20 10/07/20  Rise Mu, PA-C  metoprolol succinate (TOPROL-XL) 25 MG 24 hr tablet Take 1.5 tablets (37.5 mg) by mouth TWICE daily 07/09/20   Rise Mu, PA-C  metroNIDAZOLE (METROGEL) 1 %  gel APPLY 1 GRAM ON THE SKIN AT BEDTIME 01/08/20   Ralene Bathe, MD  Multiple Vitamins-Minerals (PRESERVISION AREDS PO) Take 1 tablet by mouth in the morning and at bedtime.    [provider]  mupirocin ointment (BACTROBAN) 2 % 1 application as needed.    [provider]  Omega-3 Fatty Acids (FISH OIL) 1000 MG CAPS Take 1,000 mg by mouth in the morning and at bedtime.    [provider]  permethrin (ELIMITE) 5 % cream Apply 1 application topically 2 (two) times daily. Applied to cheeks    [provider]  Polyethyl Glycol-Propyl Glycol (SYSTANE OP) Place 1 drop into both eyes 2 (two) times daily as needed (scheduled in the morning & in the evening if needed for dry/irritated eyes.).    [provider]  pravastatin (PRAVACHOL) 40 MG tablet TAKE ONE TABLET BY MOUTH EVERY DAY 10/08/11   Viviana Simpler I, MD  tamsulosin (FLOMAX) 0.4 MG CAPS capsule Take 1 capsule (0.4 mg total) by mouth daily after supper. 06/15/17   Bruning, Ashlyn, PA-C   vitamin E 180 MG (400 UNITS) capsule Take 400 Units by mouth in the morning.    [provider]    Allergies Codeine  Family History  Problem Relation Age of Onset  . Alzheimer's disease Mother   . Cancer Mother        Melanoma  . Diabetes Brother   . Lung cancer Brother   . Heart disease Brother     Social History Social History   Tobacco Use  . Smoking status: Former Smoker    Packs/day: 1.00    Years: 3.00    Pack years: 3.00    Types: Cigarettes    Quit date: 03/28/1959    Years since quitting: 61.3  . Smokeless tobacco: Former Systems developer    Types: Chew    Quit date: 07/06/1968  Vaping Use  . Vaping Use: Never used  Substance Use Topics  . Alcohol use: No  . Drug use: Never    Review of Systems  Review of Systems  Constitutional: Negative for chills and fever.  HENT: Negative for sore throat.   Eyes: Negative for pain.  Respiratory: Negative for cough and stridor.   Cardiovascular: Negative for chest pain.  Gastrointestinal: Negative for vomiting.  Genitourinary: Negative for dysuria.  Musculoskeletal: Negative for myalgias.  Skin: Negative for rash.  Neurological: Positive for dizziness and weakness. Negative for seizures, loss of consciousness and headaches.  Psychiatric/Behavioral: Negative for suicidal ideas.  All other systems reviewed and are negative.     ____________________________________________   PHYSICAL EXAM:  VITAL SIGNS: ED Triage Vitals  Enc Vitals Group     BP 07/24/20 1154 (!) 146/85     Pulse Rate 07/24/20 1154 75     Resp 07/24/20 1154 20     Temp 07/24/20 1154 98.8 F (37.1 C)     Temp Source 07/24/20 1154 Oral     SpO2 07/24/20 1154 99 %     Weight 07/24/20 1155 212 lb (96.2 kg)     Height 07/24/20 1155 5\' 7"  (1.702 m)     Head Circumference --      Peak Flow --      Pain Score 07/24/20 1155 3     Pain Loc --      Pain Edu? --      Excl. in [place]? --    Vitals:   07/24/20 1428 07/24/20 1519  BP:  140/89  Pulse: 94 88  Resp: 19 18  Temp:    SpO2: 94% 99%   Physical Exam Vitals and nursing note reviewed.  Constitutional:      Appearance: He is well-developed.  HENT:     Head: Normocephalic and atraumatic.     Right Ear: External ear normal.     Left Ear: External ear normal.     Nose: Nose normal.  Eyes:     Conjunctiva/sclera: Conjunctivae normal.  Cardiovascular:     Rate and Rhythm: Normal rate and regular rhythm.     Heart sounds: No murmur heard.   Pulmonary:     Effort: Pulmonary effort is normal. No respiratory distress.     Breath sounds: Normal breath sounds.  Abdominal:     Palpations: Abdomen is soft.     Tenderness: There is no abdominal tenderness.  Musculoskeletal:     Cervical back: Neck supple.  Skin:    General: Skin is warm and dry.     Capillary Refill: Capillary refill takes less than 2 seconds.  Neurological:     Mental Status: He is alert and oriented to person, place, and time.  Psychiatric:        Mood and Affect: Mood normal.     No pronator drift.  No finger dysmetria.  Very slight horizontal nystagmus when looking to the left.  No other obvious nystagmus.  Cranial nerves otherwise 2 through 12 grossly intact.  Patient has symmetric strength in his bilateral upper and lower extremities.  Sensation intact light touch of extremities.  2+ bilateral radial pulses.  Patient attempted to stand he stated he felt very dizzy and could not ambulate without assistance and further ambulation trial aborted. ____________________________________________   LABS (all labs ordered are listed, but only abnormal results are displayed)  Labs Reviewed  BASIC METABOLIC PANEL - Abnormal; Notable for the following components:      Result Value   Sodium 134 (*)    Glucose, Bld 140 (*)    All other components within normal limits  CBC - Abnormal; Notable for the following components:   RBC 4.11 (*)    Hemoglobin 12.8 (*)    HCT 38.8 (*)    All other components  within normal limits  URINALYSIS, COMPLETE (UACMP) WITH MICROSCOPIC - Abnormal; Notable for the following components:   Color, Urine YELLOW (*)    APPearance CLEAR (*)    Hgb urine dipstick LARGE (*)    RBC / HPF >50 (*)    All other components within normal limits  MAGNESIUM  CBG MONITORING, ED  TROPONIN I (HIGH SENSITIVITY)  TROPONIN I (HIGH SENSITIVITY)   ____________________________________________  EKG  A. fib with a ventricular of 75, normal axis, unremarkable significance of acute ischemia or significant arrhythmia otherwise of A. fib. ____________________________________________  RADIOLOGY  ED MD interpretation: Chest x-ray has no effusion, edema, pneumothorax, full consolidation or other clear acute intrathoracic process.  MRI brain shows no evidence of acute intracranial process.  Official radiology report(s): DG Chest 2 View  Result Date: 07/24/2020 CLINICAL DATA:  Dizziness. EXAM: CHEST - 2 VIEW COMPARISON:  03/21/2016 FINDINGS: Two views of chest demonstrate coarse lung markings. No focal lung opacities. Heart size is within normal limits and stable. No large pleural effusions. Degenerative changes in the thoracic spine. IMPRESSION: No acute cardiopulmonary disease. Electronically Signed   By: Markus Daft M.D.   On: 07/24/2020 14:53   MR BRAIN WO CONTRAST  Result Date: 07/24/2020 CLINICAL DATA:  Nonspecific dizziness.  EXAM: MRI HEAD WITHOUT CONTRAST TECHNIQUE: Multiplanar, multiecho pulse sequences of the brain and surrounding structures were obtained without intravenous contrast. COMPARISON:  None. FINDINGS: Brain: Diffusion imaging does not show any acute or subacute infarction. Chronic small-vessel ischemic changes affect pons. No focal cerebellar finding. Cerebral hemispheres show moderate chronic small-vessel ischemic change of the thalami and white matter, often seen at this age. No cortical or large vessel territory infarction. No mass lesion, hemorrhage,  hydrocephalus or extra-axial collection. Vascular: Major vessels at the base of the brain show flow. Skull and upper cervical spine: Negative Sinuses/Orbits: Clear/normal Other: No fluid in the middle ears or mastoids. IMPRESSION: No acute finding. No abnormality seen to explain the presenting symptoms. Chronic small-vessel ischemic changes throughout the brain as outlined above. Electronically Signed   By: Nelson Chimes M.D.   On: 07/24/2020 14:16    ____________________________________________   PROCEDURES  Procedure(s) performed (including Critical Care):  .1-3 Lead EKG Interpretation Performed by: Lucrezia Starch, MD Authorized by: Lucrezia Starch, MD     Interpretation: abnormal     ECG rate assessment: normal     Rhythm: atrial fibrillation     Ectopy: none       ____________________________________________   INITIAL IMPRESSION / ASSESSMENT AND PLAN / ED COURSE     Patient presents with above to history exam for assessment of some dizziness and weakness that started earlier today.  On arrival patient is afebrile and hemodynamically stable.  He is a little bit of horizontal nystagmus on exam but is unable to stand and ambulate which is only 2 without any difficulty in the ED.  Differential includes CVA, metabolic derangements, symptomatic arrhythmia, symptomatic anemia, peripheral vertigo, possible orthostasis although seems less likely based on history and exam.  Low suspicion for PE as patient is anticoagulated on Eliquis.  MR brain shows no evidence of CVA or other acute intracranial process.  This x-ray has no evidence of pneumonia or acute volume overload effusion or other clear acute thoracic process.  ECG shows A. fib but appropriately rate controlled with a rate in the 70s without evidence of acute ischemia given patient denies any associated chest pain and has a nonelevated troponin Evalose patient for ACS.  BMP shows no significant electrolyte or metabolic  derangements.  Patient does not appear acutely anemic and has no leukocytosis suggestive of acute infectious process.  UA is unremarkable.  Patient is not orthostatic.  Discussed patient's work-up and presentation with on-call cardiologist Dr. Garen Lah who recommended increasing patient's metoprolol to 50 mg twice daily from 37.5.  Discussed this with patient who on my assessment states she feels much better and on reassessment of trial of ambulation was able to ambulate unassisted with steady gait.  He states he thinks it could be his heart rate just is going too fast when he is at home when he gets up too quickly and is amenable to increasing his metoprolol per recommendation from cardiology.  He will follow-up with his cardiologist Dr. Sophronia Simas early next week.  Given stable vitals with otherwise reassuring exam work-up patient stating he feels much better I think discharge is reasonable with plan for outpatient cardiology follow-up.  Discharged stable condition.  Strict return precautions advised and discussed.       ____________________________________________   FINAL CLINICAL IMPRESSION(S) / ED DIAGNOSES  Final diagnoses:  Dizzy  Atrial fibrillation, unspecified type ([place])  Anticoagulated    Medications - No data to display   ED Discharge Orders  None       Note:  This document was prepared using Dragon voice recognition software and may include unintentional dictation errors.   Lucrezia Starch, MD 07/24/20 [phone]

## 2020-07-24 NOTE — ED Notes (Signed)
Patient returned to ED from radiology.

## 2020-07-24 NOTE — ED Notes (Signed)
Patient made aware of provider order for urine sample. Urinal provided.

## 2020-07-24 NOTE — ED Notes (Signed)
Patient transported to X-ray 

## 2020-07-24 NOTE — ED Notes (Signed)
Patient reports dizziness, and a "fuzzy" feeling in his head x several weeks. Patient reports recent changes to his cardiac meds for atrial fibrillation, including metoprolol. Patient reports he is compliant with his Eliquis. Patient reports near syncope this morning, and reports unsteady gait after near syncope. Patient denies fall, denies LOC, denies hitting head. Patient reports dizziness with sitting and standing during orthostatic vitals.

## 2020-07-24 NOTE — ED Notes (Signed)
Patient transported to MRI 

## 2020-07-26 NOTE — Telephone Encounter (Signed)
I spoke with pt and he refused his refill at this time and mentioned that he has enough. He has upcoming appointment with Dr. Quentin Ore post Hospital visit. He will discuss medication and make sure there is no changes after his visit.  Losartan 25 mg qd. Metoprolol Tartrate 50 mg bid.

## 2020-07-26 NOTE — Telephone Encounter (Signed)
Contact pt requested medication different from what has been prescribed at last ov.

## 2020-07-28 ENCOUNTER — Ambulatory Visit (INDEPENDENT_AMBULATORY_CARE_PROVIDER_SITE_OTHER): Payer: Medicare HMO | Admitting: Cardiology

## 2020-07-28 ENCOUNTER — Other Ambulatory Visit: Payer: Self-pay

## 2020-07-28 ENCOUNTER — Encounter: Payer: Self-pay | Admitting: Cardiology

## 2020-07-28 VITALS — BP 144/78 | HR 80 | Ht 67.0 in | Wt 214.6 lb

## 2020-07-28 DIAGNOSIS — I10 Essential (primary) hypertension: Secondary | ICD-10-CM

## 2020-07-28 DIAGNOSIS — I4821 Permanent atrial fibrillation: Secondary | ICD-10-CM

## 2020-07-28 DIAGNOSIS — I5032 Chronic diastolic (congestive) heart failure: Secondary | ICD-10-CM | POA: Diagnosis not present

## 2020-07-28 MED ORDER — LOSARTAN POTASSIUM 50 MG PO TABS: 50.0000 mg | ORAL_TABLET | Freq: Every day | ORAL | 3 refills | Status: DC

## 2020-07-28 NOTE — Patient Instructions (Addendum)
Medication Instructions:  Your physician has recommended you make the following change in your medication:  1. INCREASE Losartan to 50 mg daily  *If you need a refill on your cardiac medications before your next appointment, please call your pharmacy*   Lab Work: None ordered   Testing/Procedures: None ordered   Follow-Up: At [facility], you and your health needs are our priority.  As part of our continuing mission to provide you with exceptional heart care, we have created designated Provider Care Teams.  These Care Teams include your primary Cardiologist (physician) and Advanced Practice Providers (APPs -  Physician Assistants and Nurse Practitioners) who all work together to provide you with the care you need, when you need it.  Your next appointment:   3 month(s)  The format for your next appointment:   In Person  Provider:   Lars Mage, MD    Thank you for choosing [facility]!!

## 2020-07-28 NOTE — Progress Notes (Signed)
Electrophysiology Office Note:    Date:  07/28/2020   ID:  Zachary Green, DOB Nov 29, 1935, MRN [id number]  PCP:  [name], MD  [facility] Cardiologist:  [name], MD  [facility] Electrophysiologist:  Vickie Epley, MD   Referring MD: Rise Mu, PA-C   Chief Complaint: Persistent atrial fibrillation  History of Present Illness:    Zachary Green is a 85 y.o. male who presents for an evaluation of persistent atrial fibrillation at the request of [name], PA-C. Their medical history includes aortic atherosclerosis, heart failure with preserved ejection fraction/pulmonary hypertension, prostate cancer post radiation therapy, hypertension, hyperlipidemia, prior tobacco use.  He last saw a rind on July 09, 2020.  He has had at least 2 cardioversions for his atrial fibrillation.  He presented in February 2022 with palpitations, dizziness and shortness of breath and was found to be in atrial fibrillation with rapid ventricular rates.  His amiodarone was increased to 200 mg by mouth daily.  His Toprol was also increased.  He has since had an echo which showed a normal left ventricular function.  He had another cardioversion on July 08, 2020.  Today is with his son.  They tell me that they do not think that he stayed in normal rhythm more than a day or so after his most recent cardioversion.  His amiodarone has been stopped now for about 2 months.  He tells me that the biggest thing affecting his quality life at this point are frequent headaches that seem to correspond to periods with a higher blood pressure.  This started to occur after the most recent medication titration.  He did go to the emergency department recently for acute lightheadedness and dizziness that did not seem to relate to changes in his ventricular rates.  He was noted to be in atrial fibrillation with a ventricular rate in the 70s while he was hospitalized.  He had brain imaging which did not  suggest an ischemic event.  He tells me that he is able to walk to the mailbox and around the yard a little bit before getting tired and having to sit down to rest.  He also complains of a brain fog.   Past Medical History:  Diagnosis Date  . Actinic keratosis   . Arthritis   . Bladder neck contracture   . BPH associated with nocturia   . Chronic diastolic CHF (congestive heart failure) [facility]) cardiologist--- dr a. muhammad   a. echo 12/17: 55-60%, no RWMA, trivial AI, moderate MR with systolic bowing without prolapse, moderate TR, mild biatrial enlargement, PASP 50 mmHg  . Diverticulosis of colon   . DOE (dyspnea on exertion)    07-06-2020  per pt sob with chores around the house, with yard work, stairs, and long distances gets sob  . ED (erectile dysfunction)   . GERD (gastroesophageal reflux disease)   . History of basal cell carcinoma (BCC) excision    excision's ;   09/ 2015 left upper lateral chest;  09/ 2016  right lateral top shoulder;  10/ 2017 right anterior clavical area   . History of diverticulitis of colon   . History of gastric polyp    followed by dr m. Guadlupe Spanish (gi)---  previous adenomatous low grade  . History of urinary retention   . Hyperlipidemia   . Hypertension    followed by pcp and cardiology  . Light-headed feeling    07-06-2020  per pt when he goes in and out  of afib get light headed  . Macular degeneration, right eye   . Multiple pulmonary nodules    followed by pcp  . Persistent atrial fibrillation [facility]) followed by cardiology--- dr a. Rogue Jury -- first dx 12/ 2017   a. on Eliquis, amiodarone, Toprol; b. s/p successful dccv 03/28/16  and 06-07-2020; c. CHADS2VASc at least 3 (HTN, age x 2)  . Personal history of colonic adenoma 05/23/2007   04/2007 - 6 mm adenoma  . Prostate cancer [facility]) urologist--- dr Diona Fanti   dx 2017 ,  post HIFU ablative therapy 07/ 2017 and completed external radiation 04/ 2019  . Rosacea    followed by dr d. Nehemiah Massed (dermotology)   . Schatzki's ring    grade 1 varices  . Wears dentures    upper  . Wears glasses     Past Surgical History:  Procedure Laterality Date  . CARDIOVERSION N/A 06/07/2020   Procedure: CARDIOVERSION;  Surgeon: Wellington Hampshire, MD;  Location: [facility] ORS;  Service: Cardiovascular;  Laterality: N/A;  . CATARACT EXTRACTION W/ INTRAOCULAR LENS  IMPLANT, BILATERAL  2010 ;  2011  . COLONOSCOPY  last one 2014  @[facility]   . ELECTROPHYSIOLOGIC STUDY N/A 03/28/2016   Procedure: CARDIOVERSION;  Surgeon: Minna Merritts, MD;  Location: [facility] ORS;  Service: Cardiovascular;  Laterality: N/A;  . ESOPHAGOGASTRODUODENOSCOPY  last one 03-04-2018  @[facility]   . ESOPHAGOGASTRODUODENOSCOPY (EGD) WITH PROPOFOL N/A 08/18/2016   Procedure: ESOPHAGOGASTRODUODENOSCOPY (EGD) WITH PROPOFOL;  Surgeon: Lollie Sails, MD;  Location: [facility] ENDOSCOPY;  Service: Endoscopy;  Laterality: N/A;  . GREEN LIGHT LASER TURP (TRANSURETHRAL RESECTION OF PROSTATE N/A 08/17/2015   Procedure: GREEN LIGHT LASER TURP (TRANSURETHRAL RESECTION OF PROSTATE;  Surgeon: Royston Cowper, MD;  Location: [facility] ORS;  Service: Urology;  Laterality: N/A;  . NM MYOVIEW LTD    . PATELLA FRACTURE SURGERY  1984   pin placed  . TONSILLECTOMY  1949  . TRANSURETHRAL RESECTION OF BLADDER NECK N/A 07/12/2020   Procedure: TRANSURETHRAL RESECTION OF BLADDER NECK;  Surgeon: Franchot Gallo, MD;  Location: [facility];  Service: Urology;  Laterality: N/A;  . UPPER ESOPHAGEAL ENDOSCOPIC ULTRASOUND (EUS) N/A 09/21/2016   Procedure: UPPER ESOPHAGEAL ENDOSCOPIC ULTRASOUND (EUS);  Surgeon: Reita Cliche, MD;  Location: [facility] ENDOSCOPY;  Service: Gastroenterology;  Laterality: N/A;    Current Medications: Current Meds  Medication Sig  . Cholecalciferol (VITAMIN D3) 50 MCG (2000 UT) TABS Take 2,000 Units by mouth daily after supper.  . diclofenac Sodium (VOLTAREN) 1 % GEL Apply 1 application topically 4 (four) times daily as needed (pain).  Marland Kitchen docusate sodium  (COLACE) 100 MG capsule Take 100 mg by mouth 2 (two) times daily as needed (constipation).  Marland Kitchen doxycycline (VIBRAMYCIN) 50 MG capsule 1 CAPSULE BY MOUTH DAILY TAKE WITH FOOD AND PLENTY OF FLUID  . ELIQUIS 5 MG TABS tablet TAKE 1 TABLET BY MOUTH TWICE A DAY  . esomeprazole (NEXIUM) 20 MG capsule Take 20 mg by mouth daily before breakfast.  . furosemide (LASIX) 20 MG tablet TAKE 1 TABLET BY MOUTH EVERY DAY  . losartan (COZAAR) 50 MG tablet Take 1 tablet (50 mg total) by mouth daily.  . metoprolol succinate (TOPROL-XL) 50 MG 24 hr tablet Take 50 mg by mouth in the morning and at bedtime. Take with or immediately following a meal.  . metroNIDAZOLE (METROGEL) 1 % gel APPLY 1 GRAM ON THE SKIN AT BEDTIME  . Multiple Vitamins-Minerals (PRESERVISION AREDS PO) Take 1 tablet by mouth in the morning  and at bedtime.  . mupirocin ointment (BACTROBAN) 2 % 1 application as needed.  . Omega-3 Fatty Acids (FISH OIL) 1000 MG CAPS Take 1,000 mg by mouth in the morning and at bedtime.  . permethrin (ELIMITE) 5 % cream Apply 1 application topically 2 (two) times daily. Applied to cheeks  . Polyethyl Glycol-Propyl Glycol (SYSTANE OP) Place 1 drop into both eyes 2 (two) times daily as needed (scheduled in the morning & in the evening if needed for dry/irritated eyes.).  Marland Kitchen pravastatin (PRAVACHOL) 40 MG tablet TAKE ONE TABLET BY MOUTH EVERY DAY  . tamsulosin (FLOMAX) 0.4 MG CAPS capsule Take 1 capsule (0.4 mg total) by mouth daily after supper.  . vitamin E 180 MG (400 UNITS) capsule Take 400 Units by mouth in the morning.  . [DISCONTINUED] losartan (COZAAR) 25 MG tablet Take 1 tablet (25 mg total) by mouth daily.     Allergies:   Codeine   Social History   Socioeconomic History  . Marital status: Married    Spouse name: Katharine Look  . Number of children: 4  . Years of education: Not on file  . Highest education level: Not on file  Occupational History  . Occupation: retired Teacher, music: retired  Tobacco  Use  . Smoking status: Former Smoker    Packs/day: 1.00    Years: 3.00    Pack years: 3.00    Types: Cigarettes    Quit date: 03/28/1959    Years since quitting: 61.3  . Smokeless tobacco: Former Systems developer    Types: Chew    Quit date: 07/06/1968  Vaping Use  . Vaping Use: Never used  Substance and Sexual Activity  . Alcohol use: No  . Drug use: Never  . Sexual activity: Not on file  Other Topics Concern  . Not on file  Social History Narrative   Has living will   Wife is [facility] POA (then oldest son, Sherren Mocha)   Not sure about DNR---will accept resuscitation for now   Probably would not want a feeding tube   Social Determinants of Health   Financial Resource Strain: Not on file  Food Insecurity: Not on file  Transportation Needs: Not on file  Physical Activity: Not on file  Stress: Not on file  Social Connections: Not on file     Family History: The patient's family history includes Alzheimer's disease in his mother; Cancer in his mother; Diabetes in his brother; Heart disease in his brother; Lung cancer in his brother.  ROS:   Please see the history of present illness.    All other systems reviewed and are negative.  EKGs/Labs/Other Studies Reviewed:    The following studies were reviewed today:  May 27, 2020 echo personally reviewed Left ventricular function normal, 60% Right ventricular function normal Moderately dilated left atrium No significant valvular abnormalities   July 19, 2020 SPECT  There was no ST segment deviation noted during stress.  The study is normal.  The left ventricular ejection fraction is normal (55-65%).  This is a low risk study.  CT attenuation images showed mild coronary and aortic calcifications. Rest and stress perfusion was normal.   EKG:  The ekg ordered today demonstrates atrial fibrillation with a ventricular rate of 80 bpm  Recent Labs: 07/24/2020: BUN 18; Creatinine, Ser 1.08; Hemoglobin 12.8; Magnesium 1.9;  Platelets 170; Potassium 4.3; Sodium 134  Recent Lipid Panel    Component Value Date/Time   CHOL 181 11/22/2010 1012  TRIG 82.0 11/22/2010 1012   HDL 54.40 11/22/2010 1012   CHOLHDL 3 11/22/2010 1012   VLDL 16.4 11/22/2010 1012   LDLCALC 110 (H) 11/22/2010 1012   LDLDIRECT 126.2 05/01/2007 0954    Physical Exam:    VS:  BP (!) 144/78   Pulse 80   Ht 5\' 7"  (1.702 m)   Wt 214 lb 9.6 oz (97.3 kg)   SpO2 96%   BMI 33.61 kg/m     Wt Readings from Last 3 Encounters:  07/28/20 214 lb 9.6 oz (97.3 kg)  07/24/20 212 lb (96.2 kg)  07/12/20 213 lb 14.4 oz (97 kg)     GEN:  Well nourished, well developed in no acute distress HEENT: Normal NECK: No JVD; No carotid bruits LYMPHATICS: No lymphadenopathy CARDIAC: Irregularly irregular, no murmurs, rubs, gallops RESPIRATORY:  Clear to auscultation without rales, wheezing or rhonchi  ABDOMEN: Soft, non-tender, non-distended MUSCULOSKELETAL:  No edema; No deformity  SKIN: Warm and dry NEUROLOGIC:  Alert and oriented x 3 PSYCHIATRIC:  Normal affect   ASSESSMENT:    1. Permanent atrial fibrillation ([place])   2. Primary hypertension   3. Chronic diastolic CHF (congestive heart failure) (HCC)    PLAN:    In order of problems listed above:  1. Permanent atrial fibrillation The patient has failed multiple cardioversions even while taking amiodarone.  His left atrium is dilated.  His ventricular rates are well controlled.  I do not think attempting to restore normal rhythm is justified at this point.  He is not an ablation candidate.  I do not think dofetilide would have better success at rhythm control than the amiodarone he was previously taking.  I do not think his atrial fibrillation is causing many of his symptoms.  I am concerned that his brain fog may be related to medications such as the metoprolol and amiodarone.  I have advised the patient to not restart his amiodarone.  I am hopeful that as this medication finishes leaving his  system he will feel somewhat better.  At the next appointment in 3 months, we will plan to decrease his beta-blocker to see if this helps.  2.  Hypertension Not controlled.  Elevated blood pressures correspond to periods of headaches which are impacting his quality of life.  I have asked him to increase his losartan back to 50 mg by mouth daily and monitor his pressures at home.  At the next appointment, we will likely decrease the metoprolol or transition to another very low-dose of beta-blocker to see if this helps his brain fog.  3.  Chronic diastolic heart failure Elevated PA pressures.  Continue blood pressure control.  Appears euvolemic on today's exam.   Follow-up 3 months.   Medication Adjustments/Labs and Tests Ordered: Current medicines are reviewed at length with the patient today.  Concerns regarding medicines are outlined above.  Orders Placed This Encounter  Procedures  . EKG 12-Lead   Meds ordered this encounter  Medications  . losartan (COZAAR) 50 MG tablet    Sig: Take 1 tablet (50 mg total) by mouth daily.    Dispense:  90 tablet    Refill:  3     Signed, Lysbeth Galas T. Quentin Ore, MD, [facility], [facility] 07/28/2020 11:57 AM    Electrophysiology [place]

## 2020-07-29 ENCOUNTER — Ambulatory Visit: Payer: Medicare HMO | Admitting: Dermatology

## 2020-08-06 ENCOUNTER — Telehealth: Payer: Self-pay | Admitting: Cardiology

## 2020-08-06 NOTE — Telephone Encounter (Signed)
Returned call to [facility].  Per EC-Pt has had a profound cognitive change within the last month.  He would like to change Pt's metoprolol prescription to something else.  Will forward to Dr. Quentin Ore.

## 2020-08-06 NOTE — Telephone Encounter (Signed)
Pt c/o medication issue:  1. Name of Medication: metoprolol succinate  2. How are you currently taking this medication (dosage and times per day)? 50 MG 1 in morning 1 at bedtime  3. Are you having a reaction (difficulty breathing--STAT)? Brain fog   4. What is your medication issue? Would like to know if they can change medication.  States this affects cognitive abilities and would like to try something else.

## 2020-08-09 MED ORDER — METOPROLOL SUCCINATE ER 50 MG PO TB24: 50.0000 mg | ORAL_TABLET | Freq: Two times a day (BID) | ORAL | 3 refills | Status: DC

## 2020-08-09 MED ORDER — METOPROLOL SUCCINATE ER 50 MG PO TB24: 50.0000 mg | ORAL_TABLET | Freq: Every day | ORAL | 3 refills | Status: DC

## 2020-08-09 NOTE — Telephone Encounter (Signed)
Returned call to [facility].  Per EC Pt had a "bad episode" a few weeks ago that he attributes to a possible arrhythmia.  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  Advised that Dr. Quentin Ore had advised to reduce Pt's toprol XL 50 mg to daily (at the request of the [facility])  EC declines to make any med changes now.  States will call if he has any further issues.

## 2020-08-20 ENCOUNTER — Ambulatory Visit: Payer: Medicare HMO | Admitting: Physician Assistant

## 2020-08-25 ENCOUNTER — Other Ambulatory Visit: Payer: Self-pay | Admitting: Cardiovascular Disease

## 2020-08-25 NOTE — Telephone Encounter (Signed)
Refill request

## 2020-08-25 NOTE — Telephone Encounter (Signed)
16m, 97.3kg, scr 1.08 07/24/20, lovw/lambert 07/28/20

## 2020-09-09 ENCOUNTER — Encounter (INDEPENDENT_AMBULATORY_CARE_PROVIDER_SITE_OTHER): Payer: Self-pay | Admitting: Ophthalmology

## 2020-09-09 ENCOUNTER — Ambulatory Visit (INDEPENDENT_AMBULATORY_CARE_PROVIDER_SITE_OTHER): Payer: Medicare HMO | Admitting: Ophthalmology

## 2020-09-09 ENCOUNTER — Other Ambulatory Visit: Payer: Self-pay

## 2020-09-09 DIAGNOSIS — H35371 Puckering of macula, right eye: Secondary | ICD-10-CM | POA: Diagnosis not present

## 2020-09-09 DIAGNOSIS — H353114 Nonexudative age-related macular degeneration, right eye, advanced atrophic with subfoveal involvement: Secondary | ICD-10-CM | POA: Diagnosis not present

## 2020-09-09 DIAGNOSIS — H348112 Central retinal vein occlusion, right eye, stable: Secondary | ICD-10-CM

## 2020-09-09 NOTE — Assessment & Plan Note (Signed)
Minor no effect on acuity

## 2020-09-09 NOTE — Assessment & Plan Note (Signed)
Stable OD no change OD

## 2020-09-09 NOTE — Progress Notes (Signed)
09/09/2020     CHIEF COMPLAINT Patient presents for Retina Follow Up (1 Year F/U OU//Pt denies noticeable changes to [place] OU since last visit. Pt denies ocular pain, flashes of light, or floaters OU. //)   HISTORY OF PRESENT ILLNESS: Zachary Green is a 85 y.o. male who presents to the clinic today for:   HPI     Retina Follow Up           Diagnosis: Dry AMD   Laterality: both eyes   Onset: 1 year ago   Severity: moderate   Duration: 1 year   Course: stable   Comments: 1 Year F/U OU  Pt denies noticeable changes to [place] OU since last visit. Pt denies ocular pain, flashes of light, or floaters OU.          Last edited by Milly Jakob, [place] on 09/09/2020  1:09 PM.      Referring physician: [name], MD [facility] [place],  [place] [id number]  HISTORICAL INFORMATION:   Selected notes from the MEDICAL RECORD NUMBER    Lab Results  Component Value Date   HGBA1C 5.8 11/22/2010     CURRENT MEDICATIONS: Current Outpatient Medications (Ophthalmic Drugs)  Medication Sig   Polyethyl Glycol-Propyl Glycol (SYSTANE OP) Place 1 drop into both eyes 2 (two) times daily as needed (scheduled in the morning & in the evening if needed for dry/irritated eyes.).   No current facility-administered medications for this visit. (Ophthalmic Drugs)   Current Outpatient Medications (Other)  Medication Sig   Cholecalciferol (VITAMIN D3) 50 MCG (2000 UT) TABS Take 2,000 Units by mouth daily after supper.   diclofenac Sodium (VOLTAREN) 1 % GEL Apply 1 application topically 4 (four) times daily as needed (pain).   docusate sodium (COLACE) 100 MG capsule Take 100 mg by mouth 2 (two) times daily as needed (constipation).   doxycycline (VIBRAMYCIN) 50 MG capsule 1 CAPSULE BY MOUTH DAILY TAKE WITH FOOD AND PLENTY OF FLUID   ELIQUIS 5 MG TABS tablet TAKE 1 TABLET BY MOUTH TWICE A DAY   esomeprazole (NEXIUM) 20 MG capsule Take 20 mg by mouth daily before  breakfast.   furosemide (LASIX) 20 MG tablet TAKE 1 TABLET BY MOUTH EVERY DAY   losartan (COZAAR) 50 MG tablet Take 1 tablet (50 mg total) by mouth daily.   metoprolol succinate (TOPROL-XL) 50 MG 24 hr tablet Take 1 tablet (50 mg total) by mouth in the morning and at bedtime. Take with or immediately following a meal.   metroNIDAZOLE (METROGEL) 1 % gel APPLY 1 GRAM ON THE SKIN AT BEDTIME   Multiple Vitamins-Minerals (PRESERVISION AREDS PO) Take 1 tablet by mouth in the morning and at bedtime.   mupirocin ointment (BACTROBAN) 2 % 1 application as needed.   Omega-3 Fatty Acids (FISH OIL) 1000 MG CAPS Take 1,000 mg by mouth in the morning and at bedtime.   permethrin (ELIMITE) 5 % cream Apply 1 application topically 2 (two) times daily. Applied to cheeks   pravastatin (PRAVACHOL) 40 MG tablet TAKE ONE TABLET BY MOUTH EVERY DAY   tamsulosin (FLOMAX) 0.4 MG CAPS capsule Take 1 capsule (0.4 mg total) by mouth daily after supper.   vitamin E 180 MG (400 UNITS) capsule Take 400 Units by mouth in the morning.   No current facility-administered medications for this visit. (Other)      REVIEW OF SYSTEMS:    ALLERGIES Allergies  Allergen Reactions   Codeine Nausea Only  PAST MEDICAL HISTORY Past Medical History:  Diagnosis Date   Actinic keratosis    Arthritis    Bladder neck contracture    BPH associated with nocturia    Chronic diastolic CHF (congestive heart failure) [facility]) cardiologist--- dr a. muhammad   a. echo 12/17: 55-60%, no RWMA, trivial AI, moderate MR with systolic bowing without prolapse, moderate TR, mild biatrial enlargement, PASP 50 mmHg   Diverticulosis of colon    DOE (dyspnea on exertion)    07-06-2020  per pt sob with chores around the house, with yard work, stairs, and long distances gets sob   ED (erectile dysfunction)    GERD (gastroesophageal reflux disease)    History of basal cell carcinoma (BCC) excision    excision's ;   09/ 2015 left upper lateral chest;   09/ 2016  right lateral top shoulder;  10/ 2017 right anterior clavical area    History of diverticulitis of colon    History of gastric polyp    followed by dr Jerilynn Mages. Guadlupe Spanish (gi)---  previous adenomatous low grade   History of urinary retention    Hyperlipidemia    Hypertension    followed by pcp and cardiology   Light-headed feeling    07-06-2020  per pt when he goes in and out of afib get light headed   Macular degeneration, right eye    Multiple pulmonary nodules    followed by pcp   Persistent atrial fibrillation [facility]) followed by cardiology--- dr a. Rogue Jury -- first dx 12/ 2017   a. on Eliquis, amiodarone, Toprol; b. s/p successful dccv 03/28/16  and 06-07-2020; c. CHADS2VASc at least 3 (HTN, age x 2)   Personal history of colonic adenoma 05/23/2007   04/2007 - 6 mm adenoma   Prostate cancer [facility]) urologist--- dr Diona Fanti   dx 2017 ,  post HIFU ablative therapy 07/ 2017 and completed external radiation 04/ 2019   Rosacea    followed by dr d. Nehemiah Massed (dermotology)   Schatzki's ring    grade 1 varices   Wears dentures    upper   Wears glasses    Past Surgical History:  Procedure Laterality Date   CARDIOVERSION N/A 06/07/2020   Procedure: CARDIOVERSION;  Surgeon: Wellington Hampshire, MD;  Location: [facility] ORS;  Service: Cardiovascular;  Laterality: N/A;   CATARACT EXTRACTION W/ INTRAOCULAR LENS  IMPLANT, BILATERAL  2010 ;  2011   COLONOSCOPY  last one 2014  @[facility]    ELECTROPHYSIOLOGIC STUDY N/A 03/28/2016   Procedure: CARDIOVERSION;  Surgeon: Minna Merritts, MD;  Location: [facility] ORS;  Service: Cardiovascular;  Laterality: N/A;   ESOPHAGOGASTRODUODENOSCOPY  last one 03-04-2018  @[facility]    ESOPHAGOGASTRODUODENOSCOPY (EGD) WITH PROPOFOL N/A 08/18/2016   Procedure: ESOPHAGOGASTRODUODENOSCOPY (EGD) WITH PROPOFOL;  Surgeon: Lollie Sails, MD;  Location: [facility] ENDOSCOPY;  Service: Endoscopy;  Laterality: N/A;   GREEN LIGHT LASER TURP (TRANSURETHRAL RESECTION OF PROSTATE N/A 08/17/2015    Procedure: GREEN LIGHT LASER TURP (TRANSURETHRAL RESECTION OF PROSTATE;  Surgeon: Royston Cowper, MD;  Location: [facility] ORS;  Service: Urology;  Laterality: N/A;   NM MYOVIEW LTD     PATELLA FRACTURE SURGERY  1984   pin placed   TONSILLECTOMY  1949   TRANSURETHRAL RESECTION OF BLADDER NECK N/A 07/12/2020   Procedure: TRANSURETHRAL RESECTION OF BLADDER NECK;  Surgeon: Franchot Gallo, MD;  Location: [facility];  Service: Urology;  Laterality: N/A;   UPPER ESOPHAGEAL ENDOSCOPIC ULTRASOUND (EUS) N/A 09/21/2016   Procedure: UPPER ESOPHAGEAL ENDOSCOPIC ULTRASOUND (EUS);  Surgeon: Reita Cliche, MD;  Location: [facility] ENDOSCOPY;  Service: Gastroenterology;  Laterality: N/A;    FAMILY HISTORY Family History  Problem Relation Age of Onset   Alzheimer's disease Mother    Cancer Mother        Melanoma   Diabetes Brother    Lung cancer Brother    Heart disease Brother     SOCIAL HISTORY Social History   Tobacco Use   Smoking status: Former    Packs/day: 1.00    Years: 3.00    Pack years: 3.00    Types: Cigarettes    Quit date: 03/28/1959    Years since quitting: 61.4   Smokeless tobacco: Former    Types: Chew    Quit date: 07/06/1968  Vaping Use   Vaping Use: Never used  Substance Use Topics   Alcohol use: No   Drug use: Never         OPHTHALMIC EXAM:  Base Eye Exam     Visual Acuity (ETDRS)       Right Left   Dist cc E card @ 3' 20/25 +1   Dist ph cc NI     Correction: Glasses         Tonometry (Tonopen, 1:13 PM)       Right Left   Pressure 14 11         Pupils       Pupils Dark Light Shape React APD   Right PERRL 3 3 Round Brisk None   Left PERRL 3 3 Round Brisk None         Visual Fields (Counting fingers)       Left Right    Full    Restrictions  Partial outer superior nasal deficiency         Extraocular Movement       Right Left    Full Full         Neuro/Psych     Oriented x3: Yes   Mood/Affect: Normal          Dilation     Both eyes: 1.0% Mydriacyl, 2.5% Phenylephrine @ 1:13 PM           Slit Lamp and Fundus Exam     External Exam       Right Left   External Normal Normal         Slit Lamp Exam       Right Left   Lids/Lashes Normal Normal   Conjunctiva/Sclera White and quiet White and quiet   Cornea Clear Clear   Anterior Chamber Deep and quiet Deep and quiet   Iris Round and reactive Round and reactive   Lens Centered posterior chamber intraocular lens, Open posterior capsule Centered posterior chamber intraocular lens, Open posterior capsule   Anterior Vitreous Normal Normal         Fundus Exam       Right Left   Posterior Vitreous Vitrectomized Normal   Disc Normal Normal   C/D Ratio 0.6 0.4   Macula Chronic macular edema with central foveal scarring, with subfoveal disciform and retinal choroidal anastomosis as a collateral for the hemi CRVO Hard drusen, Early age related macular degeneration   Vessels Old retinal  vascular occlusions BRVO with collateralization partly through retinal choroidal anastomosis of the macula Normal   Periphery Good PRP peripherally Normal            IMAGING AND PROCEDURES  Imaging and Procedures for 09/09/20  Color Fundus Photography  Optos - OU - Both Eyes       Right Eye Progression has been stable. Disc findings include normal observations. Macula : drusen, exudates, geographic atrophy.   Left Eye Progression has been stable. Disc findings include normal observations. Macula : drusen. Vessels : normal observations. Periphery : normal observations.   Notes Disciform macular scar OD old branch retinal artery occlusion superotemporal, good PRP superotemporal with central disciform scarring secondary to retinal choroidal anastomosis from CRV O collateralization             ASSESSMENT/PLAN:  Right epiretinal membrane Minor no effect on acuity  Stable central retinal vein occlusion of right eye Because of for  ischemic disease, control with peripheral sector PRP  Advanced nonexudative age-related macular degeneration of right eye with subfoveal involvement Stable OD no change OD     ICD-10-CM   1. Advanced nonexudative age-related macular degeneration of right eye with subfoveal involvement  H35.3114 Color Fundus Photography Optos - OU - Both Eyes    2. Stable central retinal vein occlusion of right eye  H34.8112 Color Fundus Photography Optos - OU - Both Eyes    3. Right epiretinal membrane  H35.371       1.  No change in retinal pathology in either eye.  2.  Labile old compensated CRV O with permanent central macular scarring will observe  3.  Ophthalmic Meds Ordered this visit:  No orders of the defined types were placed in this encounter.      Return in about 1 year (around 09/09/2021) for COLOR FP, OCT.  There are no Patient Instructions on file for this visit.   Explained the diagnoses, plan, and follow up with the patient and they expressed understanding.  Patient expressed understanding of the importance of proper follow up care.   Clent Demark Lysandra Loughmiller M.D. Diseases & Surgery of the Retina and Vitreous [facility] [place] 09/09/20     Abbreviations: M myopia (nearsighted); A astigmatism; H hyperopia (farsighted); P presbyopia; Mrx spectacle prescription;  CTL contact lenses; OD right eye; OS left eye; OU both eyes  XT exotropia; ET esotropia; PEK punctate epithelial keratitis; PEE punctate epithelial erosions; DES dry eye syndrome; MGD meibomian gland dysfunction; ATs artificial tears; PFAT's preservative free artificial tears; [place] nuclear sclerotic cataract; PSC posterior subcapsular cataract; ERM epi-retinal membrane; PVD posterior vitreous detachment; RD retinal detachment; DM diabetes mellitus; DR diabetic retinopathy; NPDR non-proliferative diabetic retinopathy; PDR proliferative diabetic retinopathy; CSME clinically significant macular edema; DME diabetic macular  edema; dbh dot blot hemorrhages; CWS cotton wool spot; POAG primary open angle glaucoma; C/D cup-to-disc ratio; HVF humphrey visual field; GVF goldmann visual field; OCT optical coherence tomography; IOP intraocular pressure; BRVO Branch retinal vein occlusion; CRVO central retinal vein occlusion; CRAO central retinal artery occlusion; BRAO branch retinal artery occlusion; RT retinal tear; SB scleral buckle; PPV pars plana vitrectomy; VH Vitreous hemorrhage; PRP panretinal laser photocoagulation; IVK intravitreal kenalog; VMT vitreomacular traction; MH Macular hole;  NVD neovascularization of the disc; NVE neovascularization elsewhere; AREDS age related eye disease study; ARMD age related macular degeneration; POAG primary open angle glaucoma; EBMD epithelial/anterior basement membrane dystrophy; ACIOL anterior chamber intraocular lens; IOL intraocular lens; PCIOL posterior chamber intraocular lens; Phaco/IOL phacoemulsification with intraocular lens placement; [place] photorefractive keratectomy; LASIK laser assisted in situ keratomileusis; HTN hypertension; DM diabetes mellitus; COPD chronic obstructive pulmonary disease

## 2020-09-09 NOTE — Assessment & Plan Note (Signed)
Because of for ischemic disease, control with peripheral sector PRP

## 2020-09-25 ENCOUNTER — Other Ambulatory Visit: Payer: Self-pay | Admitting: Dermatology

## 2020-10-08 ENCOUNTER — Other Ambulatory Visit: Payer: Self-pay | Admitting: Physician Assistant

## 2020-10-27 ENCOUNTER — Ambulatory Visit: Payer: Medicare HMO | Admitting: Cardiology

## 2020-10-27 ENCOUNTER — Encounter: Payer: Self-pay | Admitting: Cardiology

## 2020-10-27 ENCOUNTER — Other Ambulatory Visit: Payer: Self-pay

## 2020-10-27 VITALS — BP 130/80 | HR 75 | Ht 67.0 in | Wt 210.0 lb

## 2020-10-27 DIAGNOSIS — I4821 Permanent atrial fibrillation: Secondary | ICD-10-CM

## 2020-10-27 DIAGNOSIS — I10 Essential (primary) hypertension: Secondary | ICD-10-CM

## 2020-10-27 DIAGNOSIS — I5032 Chronic diastolic (congestive) heart failure: Secondary | ICD-10-CM | POA: Diagnosis not present

## 2020-10-27 MED ORDER — METOPROLOL SUCCINATE ER 25 MG PO TB24: 25.0000 mg | ORAL_TABLET | Freq: Two times a day (BID) | ORAL | 3 refills | Status: DC

## 2020-10-27 NOTE — Progress Notes (Signed)
Electrophysiology Office Follow up Visit Note:    Date:  10/27/2020   ID:  Zachary Green, DOB 08-28-1935, MRN [id number]  PCP:  [name], MD  [facility] Cardiologist:  [name], MD  [facility] Electrophysiologist:  Vickie Epley, MD    Interval History:    Zachary Green is a 85 y.o. male who presents for a follow up visit. They were last seen in clinic Jul 28, 2020.  He has permanent atrial fibrillation.  At the last appointment we stopped taking amiodarone and plan to touch base today to discuss decreasing the dose of his beta-blocker.  We also increased his losartan dose at the last appointment to see if we can get better control of his blood pressures.  He is with his son today in clinic who I have previously met.  The patient continues to report foggy thinking and fatigue.  He also has some right hip pain for which she is planning to see an orthopedic doctor in the next few weeks.   Past Medical History:  Diagnosis Date   Actinic keratosis    Arthritis    Bladder neck contracture    BPH associated with nocturia    Chronic diastolic CHF (congestive heart failure) [facility]) cardiologist--- dr a. muhammad   a. echo 12/17: 55-60%, no RWMA, trivial AI, moderate MR with systolic bowing without prolapse, moderate TR, mild biatrial enlargement, PASP 50 mmHg   Diverticulosis of colon    DOE (dyspnea on exertion)    07-06-2020  per pt sob with chores around the house, with yard work, stairs, and long distances gets sob   ED (erectile dysfunction)    GERD (gastroesophageal reflux disease)    History of basal cell carcinoma (BCC) excision    excision's ;   09/ 2015 left upper lateral chest;  09/ 2016  right lateral top shoulder;  10/ 2017 right anterior clavical area    History of diverticulitis of colon    History of gastric polyp    followed by dr Jerilynn Mages. Guadlupe Spanish (gi)---  previous adenomatous low grade   History of urinary retention    Hyperlipidemia     Hypertension    followed by pcp and cardiology   Light-headed feeling    07-06-2020  per pt when he goes in and out of afib get light headed   Macular degeneration, right eye    Multiple pulmonary nodules    followed by pcp   Persistent atrial fibrillation [facility]) followed by cardiology--- dr a. Rogue Jury -- first dx 12/ 2017   a. on Eliquis, amiodarone, Toprol; b. s/p successful dccv 03/28/16  and 06-07-2020; c. CHADS2VASc at least 3 (HTN, age x 2)   Personal history of colonic adenoma 05/23/2007   04/2007 - 6 mm adenoma   Prostate cancer [facility]) urologist--- dr Diona Fanti   dx 2017 ,  post HIFU ablative therapy 07/ 2017 and completed external radiation 04/ 2019   Rosacea    followed by dr d. Nehemiah Massed (dermotology)   Schatzki's ring    grade 1 varices   Wears dentures    upper   Wears glasses     Past Surgical History:  Procedure Laterality Date   CARDIOVERSION N/A 06/07/2020   Procedure: CARDIOVERSION;  Surgeon: Wellington Hampshire, MD;  Location: [facility] ORS;  Service: Cardiovascular;  Laterality: N/A;   CATARACT EXTRACTION W/ INTRAOCULAR LENS  IMPLANT, BILATERAL  2010 ;  2011   COLONOSCOPY  last one 2014  '@[facility]'    ELECTROPHYSIOLOGIC  STUDY N/A 03/28/2016   Procedure: CARDIOVERSION;  Surgeon: Minna Merritts, MD;  Location: [facility] ORS;  Service: Cardiovascular;  Laterality: N/A;   ESOPHAGOGASTRODUODENOSCOPY  last one 03-04-2018  '@[facility]'    ESOPHAGOGASTRODUODENOSCOPY (EGD) WITH PROPOFOL N/A 08/18/2016   Procedure: ESOPHAGOGASTRODUODENOSCOPY (EGD) WITH PROPOFOL;  Surgeon: Lollie Sails, MD;  Location: [facility] ENDOSCOPY;  Service: Endoscopy;  Laterality: N/A;   GREEN LIGHT LASER TURP (TRANSURETHRAL RESECTION OF PROSTATE N/A 08/17/2015   Procedure: GREEN LIGHT LASER TURP (TRANSURETHRAL RESECTION OF PROSTATE;  Surgeon: Royston Cowper, MD;  Location: [facility] ORS;  Service: Urology;  Laterality: N/A;   NM MYOVIEW LTD     PATELLA FRACTURE SURGERY  1984   pin placed   TONSILLECTOMY  1949   TRANSURETHRAL  RESECTION OF BLADDER NECK N/A 07/12/2020   Procedure: TRANSURETHRAL RESECTION OF BLADDER NECK;  Surgeon: Franchot Gallo, MD;  Location[facility];  Service: Urology;  Laterality: N/A;   UPPER ESOPHAGEAL ENDOSCOPIC ULTRASOUND (EUS) N/A 09/21/2016   Procedure: UPPER ESOPHAGEAL ENDOSCOPIC ULTRASOUND (EUS);  Surgeon: Reita Cliche, MD;  Location: [facility] ENDOSCOPY;  Service: Gastroenterology;  Laterality: N/A;    Current Medications: Current Meds  Medication Sig   Cholecalciferol (VITAMIN D3) 50 MCG (2000 UT) TABS Take 2,000 Units by mouth daily after supper.   Diclofenac Sodium 3 % GEL Apply topically.   docusate sodium (COLACE) 100 MG capsule Take 100 mg by mouth 2 (two) times daily as needed (constipation).   doxycycline (VIBRAMYCIN) 50 MG capsule 1 CAPSULE BY MOUTH DAILY TAKE WITH FOOD AND PLENTY OF FLUID   ELIQUIS 5 MG TABS tablet TAKE 1 TABLET BY MOUTH TWICE A DAY   esomeprazole (NEXIUM) 20 MG capsule Take 20 mg by mouth daily before breakfast.   furosemide (LASIX) 20 MG tablet TAKE 1 TABLET BY MOUTH EVERY DAY   losartan (COZAAR) 50 MG tablet Take 1 tablet (50 mg total) by mouth daily.   metoprolol succinate (TOPROL-XL) 50 MG 24 hr tablet Take 1 tablet (50 mg total) by mouth in the morning and at bedtime. Take with or immediately following a meal.   metroNIDAZOLE (METROGEL) 1 % gel APPLY 1 GRAM ON THE SKIN AT BEDTIME   Multiple Vitamins-Minerals (PRESERVISION AREDS PO) Take 1 tablet by mouth in the morning and at bedtime.   mupirocin ointment (BACTROBAN) 2 % 1 application as needed.   Omega-3 Fatty Acids (FISH OIL) 1000 MG CAPS Take 1,000 mg by mouth in the morning and at bedtime.   permethrin (ELIMITE) 5 % cream Apply 1 application topically 2 (two) times daily. Applied to cheeks   Polyethyl Glycol-Propyl Glycol (SYSTANE OP) Place 1 drop into both eyes 2 (two) times daily as needed (scheduled in the morning & in the evening if needed for dry/irritated eyes.).    pravastatin (PRAVACHOL) 40 MG tablet TAKE ONE TABLET BY MOUTH EVERY DAY   tamsulosin (FLOMAX) 0.4 MG CAPS capsule Take 1 capsule (0.4 mg total) by mouth daily after supper.   vitamin E 180 MG (400 UNITS) capsule Take 400 Units by mouth in the morning.   [DISCONTINUED] diclofenac Sodium (VOLTAREN) 1 % GEL Apply 1 application topically 4 (four) times daily as needed (pain).     Allergies:   Codeine   Social History   Socioeconomic History   Marital status: Married    Spouse name: Katharine Look   Number of children: 4   Years of education: Not on file   Highest education level: Not on file  Occupational History  Occupation: retired Teacher, music: retired  Tobacco Use   Smoking status: Former    Packs/day: 1.00    Years: 3.00    Pack years: 3.00    Types: Cigarettes    Quit date: 03/28/1959    Years since quitting: 61.6   Smokeless tobacco: Former    Types: Chew    Quit date: 07/06/1968  Vaping Use   Vaping Use: Never used  Substance and Sexual Activity   Alcohol use: No   Drug use: Never   Sexual activity: Not on file  Other Topics Concern   Not on file  Social History Narrative   Has living will   Wife is [place] health care POA (then oldest son, Sherren Mocha)   Not sure about DNR---will accept resuscitation for now   Probably would not want a feeding tube   Social Determinants of Health   Financial Resource Strain: Not on file  Food Insecurity: Not on file  Transportation Needs: Not on file  Physical Activity: Not on file  Stress: Not on file  Social Connections: Not on file     Family History: The patient's family history includes Alzheimer's disease in his mother; Cancer in his mother; Diabetes in his brother; Heart disease in his brother; Lung cancer in his brother.  ROS:   Please see the history of present illness.    All other systems reviewed and are negative.  EKGs/Labs/Other Studies Reviewed:    The following studies were reviewed today:   May 27, 2020  echo personally reviewed Left ventricular function normal, 60% Right ventricular function normal No significant valvular abnormalities  EKG:  The ekg ordered today demonstrates atrial fibrillation with a ventricular rate of 75 bpm  Recent Labs: 07/24/2020: BUN 18; Creatinine, Ser 1.08; Hemoglobin 12.8; Magnesium 1.9; Platelets 170; Potassium 4.3; Sodium 134  Recent Lipid Panel    Component Value Date/Time   CHOL 181 11/22/2010 1012   TRIG 82.0 11/22/2010 1012   HDL 54.40 11/22/2010 1012   CHOLHDL 3 11/22/2010 1012   VLDL 16.4 11/22/2010 1012   LDLCALC 110 (H) 11/22/2010 1012   LDLDIRECT 126.2 05/01/2007 0954    Physical Exam:    VS:  BP 130/80 (BP Location: Left Arm, Patient Position: Sitting, Cuff Size: Normal)   Pulse 75   Ht '5\' 7"'  (1.702 m)   Wt 210 lb (95.3 kg)   SpO2 90%   BMI 32.89 kg/m     Wt Readings from Last 3 Encounters:  10/27/20 210 lb (95.3 kg)  07/28/20 214 lb 9.6 oz (97.3 kg)  07/24/20 212 lb (96.2 kg)     GEN:  Well nourished, well developed in no acute distress HEENT: Normal NECK: No JVD; No carotid bruits LYMPHATICS: No lymphadenopathy CARDIAC: Irregularly irregular, no murmurs, rubs, gallops RESPIRATORY:  Clear to auscultation without rales, wheezing or rhonchi  ABDOMEN: Soft, non-tender, non-distended MUSCULOSKELETAL:  No edema; No deformity  SKIN: Warm and dry NEUROLOGIC:  Alert and oriented x 3 PSYCHIATRIC:  Normal affect   ASSESSMENT:    1. Permanent atrial fibrillation ([place])   2. Primary hypertension   3. Chronic diastolic CHF (congestive heart failure) (HCC)    PLAN:    In order of problems listed above:   1. Permanent atrial fibrillation (HCC) On Eliquis for stroke prophylaxis.  He is rate controlled and permanent atrial fibrillation.  He continues to report fogginess in his thinking.  I am not really sure what is causing this.  He is on  a moderate dose of metoprolol.  We can try to decrease the dose to 25 mg by mouth twice daily  and see if this helps.  Otherwise I would recommend continued follow-up with primary care.  2. Primary hypertension Controlled.  Continue current medication regimen including losartan, metoprolol  3. Chronic diastolic CHF (congestive heart failure) (HCC) NYHA class II.  Warm and dry.   Follow-up with a PA/NP in 6 months. Follow-up with me as needed.   Medication Adjustments/Labs and Tests Ordered: Current medicines are reviewed at length with the patient today.  Concerns regarding medicines are outlined above.  No orders of the defined types were placed in this encounter.  No orders of the defined types were placed in this encounter.    Signed, Lars Mage, MD, [facility], [facility] 10/27/2020 10:54 AM    Electrophysiology [facility]

## 2020-10-27 NOTE — Patient Instructions (Addendum)
Medication Instructions:  Your physician has recommended you make the following change in your medication:    REDUCE your metoprolol succinate -  Take 25 mg by mouth twice a day  (You may cut your 50 mg tablets in half and take twice a day until they are gone)  *If you need a refill on your cardiac medications before your next appointment, please call your pharmacy*  Lab Work: None ordered. If you have labs (blood work) drawn today and your tests are completely normal, you will receive your results only by: [place] (if you have MyChart) OR A paper copy in the mail If you have any lab test that is abnormal or we need to change your treatment, we will call you to review the results.  Testing/Procedures: None ordered.  Follow-Up: At [facility], you and your health needs are our priority.  As part of our continuing mission to provide you with exceptional heart care, we have created designated Provider Care Teams.  These Care Teams include your primary Cardiologist (physician) and Advanced Practice Providers (APPs -  Physician Assistants and Nurse Practitioners) who all work together to provide you with the care you need, when you need it.  Your next appointment:   Your physician wants you to follow-up in: 6 months with one of the following Advanced Practice Providers on your designated Care Team:   Ignacia Bayley, NP [name], PA [place], [place]

## 2020-11-23 ENCOUNTER — Other Ambulatory Visit: Payer: Self-pay | Admitting: Cardiovascular Disease

## 2021-01-01 ENCOUNTER — Other Ambulatory Visit: Payer: Self-pay | Admitting: Cardiovascular Disease

## 2021-01-03 ENCOUNTER — Other Ambulatory Visit: Payer: Self-pay | Admitting: Physician Assistant

## 2021-01-06 ENCOUNTER — Other Ambulatory Visit: Payer: Self-pay | Admitting: Sports Medicine

## 2021-01-06 DIAGNOSIS — M5136 Other intervertebral disc degeneration, lumbar region: Secondary | ICD-10-CM

## 2021-01-06 DIAGNOSIS — M47816 Spondylosis without myelopathy or radiculopathy, lumbar region: Secondary | ICD-10-CM

## 2021-01-06 DIAGNOSIS — G8929 Other chronic pain: Secondary | ICD-10-CM

## 2021-01-08 ENCOUNTER — Other Ambulatory Visit: Payer: Self-pay | Admitting: Physician Assistant

## 2021-01-18 ENCOUNTER — Ambulatory Visit
Admission: RE | Admit: 2021-01-18 | Discharge: 2021-01-18 | Disposition: A | Discharge status: Home / Self Care | Payer: Medicare HMO | Source: Ambulatory Visit | Attending: Sports Medicine | Admitting: Sports Medicine

## 2021-01-18 ENCOUNTER — Other Ambulatory Visit: Payer: Self-pay

## 2021-01-18 DIAGNOSIS — M47816 Spondylosis without myelopathy or radiculopathy, lumbar region: Secondary | ICD-10-CM | POA: Insufficient documentation

## 2021-01-18 DIAGNOSIS — M5441 Lumbago with sciatica, right side: Secondary | ICD-10-CM | POA: Insufficient documentation

## 2021-01-18 DIAGNOSIS — M5136 Other intervertebral disc degeneration, lumbar region: Secondary | ICD-10-CM | POA: Insufficient documentation

## 2021-01-18 DIAGNOSIS — G8929 Other chronic pain: Secondary | ICD-10-CM | POA: Diagnosis present

## 2021-02-11 ENCOUNTER — Other Ambulatory Visit: Payer: Self-pay | Admitting: Orthopedic Surgery

## 2021-02-11 DIAGNOSIS — M1611 Unilateral primary osteoarthritis, right hip: Secondary | ICD-10-CM

## 2021-02-23 ENCOUNTER — Other Ambulatory Visit: Payer: Self-pay | Admitting: Cardiology

## 2021-02-23 NOTE — Telephone Encounter (Signed)
This is a [place] pt 

## 2021-02-24 NOTE — Telephone Encounter (Signed)
Refill Request.  

## 2021-02-24 NOTE — Telephone Encounter (Signed)
Prescription refill request for Eliquis received. Indication: Atrail Fib Last office visit: 10/27/20  Grayce Sessions MD Scr: 1.0 on 10/27/20 Age: 85 Weight: 95.3kg  Based on above findings Eliquis 5mg  twice daily is the appropriate dose.  Refill approved.

## 2021-02-25 ENCOUNTER — Ambulatory Visit
Admission: RE | Admit: 2021-02-25 | Discharge: 2021-02-25 | Disposition: A | Discharge status: Home / Self Care | Payer: Medicare HMO | Source: Ambulatory Visit | Attending: Orthopedic Surgery | Admitting: Orthopedic Surgery

## 2021-02-25 DIAGNOSIS — M1611 Unilateral primary osteoarthritis, right hip: Secondary | ICD-10-CM | POA: Diagnosis not present

## 2021-04-27 NOTE — Progress Notes (Signed)
Cardiology Office Note    Date:  04/29/2021   ID:  Zachary Green, DOB September 18, 1935, MRN [id number]  PCP:  [name], MD  Cardiologist:  [name], MD  Electrophysiologist:  Vickie Epley, MD   Chief Complaint: Follow up  History of Present Illness:   Zachary Green is a 86 y.o. male with history of permanent Afib on Eliquis s/p prior DCCV s/p repeat unsuccessful DCCV 06/07/2020 for recurrent Afib, aortic atherosclerosis, HFpEF/pulmonary hypertension, pulmonary nodules, BPH and prostate cancer s/p radiation therapy, HTN, HLD, prior tobacco use for 5 years in his 25s, and family history notable for Afib who presents for follow up A. fib.    Echo in 2017 showed a normal LVSF, moderate MR/TR, and moderate pulmonary hypertension. He was treated with low dose Lasix with stable symptoms noted in follow up. Nuclear stress test in 2017 was normal.    In follow up in 11/2019, he was maintaining sinus rhythm.   He was seen in 04/2020 with recurrent palpitations, dizziness, fatigue, and increased SOB that dated back to 03/2020. He was noted to be in Afib with ventricular rate of 100 bpm. He was on amiodarone 100 mg daily and was reloaded followed by increased dose of maintenance dose of 200 mg daily. His Toprol was also increased to 25 mg daily. Echo on 05/27/2020 showed an EF of 60-65%, no RWMA, indeterminate LV diastolic function parameters, normal RVSF and ventricular cavity size, mildly elevated PASP at 36.2 mmHg, moderately dilated left atrium at 4.6 cm, and mild mitral regurgitation. When he was seen in follow up in 05/2020, he remained in Afib with controlled ventricular response and continued to note exertional dyspnea.  He underwent briefly successful DCCV on 3/81/8299 without complication.  When he was seen in follow-up on 07/01/2020 he was noted to be in atrial flutter.  He noted persistent lightheadedness and fatigue which were largely unchanged when compared to his prior visits.  His  case was discussed with his primary cardiologist, and given he had failed amiodarone this was discontinued.  Toprol-XL was titrated to 37.5mg  twice daily.  In an effort to prevent significant hypotension losartan was tapered to 50 mg.  He was referred to EP for further management, at the request of his cardiologist.  Upon establishing with EP in 07/2020, it was felt his underlying A. fib was not contributing to the majority of his symptoms with recommendation to pursue rate control.  Main complaint at that time was brain fog and headaches.  He was most recently seen by EP in 10/2020 and continued to report fatigue and underlying brain fogginess.  It was unclear what was leading to this.  Metoprolol was decreased to 25 mg 2 times daily with recommendation for him to follow-up with his PCP.  Of note, MRI of the brain in 06/2020 showed no acute intracranial abnormality.  He comes in today accompanied by his son.  He has undergone a steroid injection in the right hip by orthopedics with noted significant improvement in his functional status and quality of life.  He is without symptoms of angina or decompensation.  His weight is stable.  He does continue to note some cognitive decline and "fogginess."  There is also stable exertional dyspnea and fatigue.  No falls, hematochezia, or melena.  BP and heart rate at home have been reasonably controlled.  Overall, patient and son are pleased with his current status, though are open to small medication changes to see if this will help  with his cognitive status.   Labs independently reviewed: 02/2021 - potassium 4.8, BUN 18, serum creatinine 1.0, albumin 4.5, AST/ALT normal, Hgb 13.6, PLT 163, A1c 5.9, TC 164, TG 135, HDL 55, LDL 81 06/2020 - magnesium 1.9 02/2016 - TSH normal  Past Medical History:  Diagnosis Date   Actinic keratosis    Arthritis    Bladder neck contracture    BPH associated with nocturia    Chronic diastolic CHF (congestive heart failure) [facility])  cardiologist--- dr a. muhammad   a. echo 12/17: 55-60%, no RWMA, trivial AI, moderate MR with systolic bowing without prolapse, moderate TR, mild biatrial enlargement, PASP 50 mmHg   Diverticulosis of colon    DOE (dyspnea on exertion)    07-06-2020  per pt sob with chores around the house, with yard work, stairs, and long distances gets sob   ED (erectile dysfunction)    GERD (gastroesophageal reflux disease)    History of basal cell carcinoma (BCC) excision    excision's ;   09/ 2015 left upper lateral chest;  09/ 2016  right lateral top shoulder;  10/ 2017 right anterior clavical area    History of diverticulitis of colon    History of gastric polyp    followed by dr Jerilynn Mages. Guadlupe Spanish (gi)---  previous adenomatous low grade   History of urinary retention    Hyperlipidemia    Hypertension    followed by pcp and cardiology   Light-headed feeling    07-06-2020  per pt when he goes in and out of afib get light headed   Macular degeneration, right eye    Multiple pulmonary nodules    followed by pcp   Persistent atrial fibrillation [facility]) followed by cardiology--- dr a. Rogue Jury -- first dx 12/ 2017   a. on Eliquis, amiodarone, Toprol; b. s/p successful dccv 03/28/16  and 06-07-2020; c. CHADS2VASc at least 3 (HTN, age x 2)   Personal history of colonic adenoma 05/23/2007   04/2007 - 6 mm adenoma   Prostate cancer [facility]) urologist--- dr Diona Fanti   dx 2017 ,  post HIFU ablative therapy 07/ 2017 and completed external radiation 04/ 2019   Rosacea    followed by dr d. Nehemiah Massed (dermotology)   Schatzki's ring    grade 1 varices   Wears dentures    upper   Wears glasses     Past Surgical History:  Procedure Laterality Date   CARDIOVERSION N/A 06/07/2020   Procedure: CARDIOVERSION;  Surgeon: Wellington Hampshire, MD;  Location: [facility] ORS;  Service: Cardiovascular;  Laterality: N/A;   CATARACT EXTRACTION W/ INTRAOCULAR LENS  IMPLANT, BILATERAL  2010 ;  2011   COLONOSCOPY  last one 2014  @[facility]     ELECTROPHYSIOLOGIC STUDY N/A 03/28/2016   Procedure: CARDIOVERSION;  Surgeon: Minna Merritts, MD;  Location: [facility] ORS;  Service: Cardiovascular;  Laterality: N/A;   ESOPHAGOGASTRODUODENOSCOPY  last one 03-04-2018  @[facility]    ESOPHAGOGASTRODUODENOSCOPY (EGD) WITH PROPOFOL N/A 08/18/2016   Procedure: ESOPHAGOGASTRODUODENOSCOPY (EGD) WITH PROPOFOL;  Surgeon: Lollie Sails, MD;  Location: [facility] ENDOSCOPY;  Service: Endoscopy;  Laterality: N/A;   GREEN LIGHT LASER TURP (TRANSURETHRAL RESECTION OF PROSTATE N/A 08/17/2015   Procedure: GREEN LIGHT LASER TURP (TRANSURETHRAL RESECTION OF PROSTATE;  Surgeon: Royston Cowper, MD;  Location: [facility] ORS;  Service: Urology;  Laterality: N/A;   NM MYOVIEW LTD     PATELLA FRACTURE SURGERY  1984   pin placed   TONSILLECTOMY  1949   TRANSURETHRAL RESECTION OF BLADDER NECK N/A  07/12/2020   Procedure: TRANSURETHRAL RESECTION OF BLADDER NECK;  Surgeon: Franchot Gallo, MD;  Location: [facility];  Service: Urology;  Laterality: N/A;   UPPER ESOPHAGEAL ENDOSCOPIC ULTRASOUND (EUS) N/A 09/21/2016   Procedure: UPPER ESOPHAGEAL ENDOSCOPIC ULTRASOUND (EUS);  Surgeon: Reita Cliche, MD;  Location: [facility] ENDOSCOPY;  Service: Gastroenterology;  Laterality: N/A;    Current Medications: Current Meds  Medication Sig   Cholecalciferol (VITAMIN D3) 50 MCG (2000 UT) TABS Take 2,000 Units by mouth daily after supper.   Diclofenac Sodium 3 % GEL Apply topically.   docusate sodium (COLACE) 100 MG capsule Take 100 mg by mouth 2 (two) times daily as needed (constipation).   doxycycline (VIBRAMYCIN) 50 MG capsule 1 CAPSULE BY MOUTH DAILY TAKE WITH FOOD AND PLENTY OF FLUID   ELIQUIS 5 MG TABS tablet TAKE 1 TABLET BY MOUTH TWICE A DAY   esomeprazole (NEXIUM) 20 MG capsule Take 20 mg by mouth daily before breakfast.   furosemide (LASIX) 20 MG tablet TAKE 1 TABLET BY MOUTH EVERY DAY   losartan (COZAAR) 50 MG tablet Take 1 tablet (50 mg total) by mouth daily.    metroNIDAZOLE (METROGEL) 1 % gel APPLY 1 GRAM ON THE SKIN AT BEDTIME   Multiple Vitamins-Minerals (PRESERVISION AREDS PO) Take 1 tablet by mouth in the morning and at bedtime.   mupirocin ointment (BACTROBAN) 2 % 1 application as needed.   Omega-3 Fatty Acids (FISH OIL) 1000 MG CAPS Take 1,000 mg by mouth in the morning and at bedtime.   permethrin (ELIMITE) 5 % cream Apply 1 application topically 2 (two) times daily. Applied to cheeks   Polyethyl Glycol-Propyl Glycol (SYSTANE OP) Place 1 drop into both eyes 2 (two) times daily as needed (scheduled in the morning & in the evening if needed for dry/irritated eyes.).   pravastatin (PRAVACHOL) 40 MG tablet TAKE ONE TABLET BY MOUTH EVERY DAY   tamsulosin (FLOMAX) 0.4 MG CAPS capsule Take 1 capsule (0.4 mg total) by mouth daily after supper.   vitamin E 180 MG (400 UNITS) capsule Take 400 Units by mouth in the morning.   [DISCONTINUED] metoprolol succinate (TOPROL-XL) 25 MG 24 hr tablet Take 1 tablet (25 mg total) by mouth in the morning and at bedtime.    Allergies:   Codeine   Social History   Socioeconomic History   Marital status: Married    Spouse name: Katharine Look   Number of children: 4   Years of education: Not on file   Highest education level: Not on file  Occupational History   Occupation: retired Teacher, music: retired  Tobacco Use   Smoking status: Former    Packs/day: 1.00    Years: 3.00    Pack years: 3.00    Types: Cigarettes    Quit date: 03/28/1959    Years since quitting: 62.1   Smokeless tobacco: Former    Types: Chew    Quit date: 07/06/1968  Vaping Use   Vaping Use: Never used  Substance and Sexual Activity   Alcohol use: No   Drug use: Never   Sexual activity: Not on file  Other Topics Concern   Not on file  Social History Narrative   Has living will   Wife is Zachary Green (then oldest son, Sherren Mocha)   Not sure about DNR---will accept resuscitation for now   Probably would not want a feeding tube    Social Determinants of Health   Financial Resource Strain: Not on  file  Food Insecurity: Not on file  Transportation Needs: Not on file  Physical Activity: Not on file  Stress: Not on file  Social Connections: Not on file     Family History:  The patient's family history includes Alzheimer's disease in his mother; Cancer in his mother; Diabetes in his brother; Heart disease in his brother; Lung cancer in his brother.  ROS:   Review of Systems  Constitutional:  Positive for malaise/fatigue. Negative for chills, diaphoresis, fever and weight loss.  HENT:  Negative for congestion.   Eyes:  Negative for discharge and redness.  Respiratory:  Negative for cough, sputum production, shortness of breath and wheezing.   Cardiovascular:  Negative for chest pain, palpitations, orthopnea, claudication, leg swelling and PND.  Gastrointestinal:  Negative for abdominal pain, blood in stool, heartburn, melena, nausea and vomiting.  Musculoskeletal:  Negative for falls and myalgias.  Skin:  Negative for rash.  Neurological:  Negative for dizziness, tingling, tremors, sensory change, speech change, focal weakness, loss of consciousness and weakness.       Mental "fogginess"  Endo/Heme/Allergies:  Does not bruise/bleed easily.  Psychiatric/Behavioral:  Negative for substance abuse. The patient is not nervous/anxious.   All other systems reviewed and are negative.   EKGs/Labs/Other Studies Reviewed:    Studies reviewed were summarized above. The additional studies were reviewed today:  Lexiscan MPI 07/19/2020: There was no ST segment deviation noted during stress. The study is normal. The left ventricular ejection fraction is normal (55-65%). This is a low risk study. CT attenuation images showed mild coronary and aortic calcifications. __________  2D echo 05/27/2020: 1. Left ventricular ejection fraction, by estimation, is 60 to 65%. The  left ventricle has normal function. The left  ventricle has no regional  wall motion abnormalities. There is moderate left ventricular hypertrophy.  Left ventricular diastolic  parameters are indeterminate.   2. Right ventricular systolic function is normal. The right ventricular  size is normal. There is mildly elevated pulmonary artery systolic  pressure.   3. Left atrial size was moderately dilated.   4. The mitral valve is normal in structure. Mild mitral valve  Regurgitation. __________   Carlton Adam MPI 03/16/2016: There was no ST segment deviation noted during stress. The study is normal. This is a low risk study. The left ventricular ejection fraction is normal (55-65%). __________   2D echo 03/09/2016: - Left ventricle: The cavity size was normal. There was moderate    concentric hypertrophy. Systolic function was normal. The    estimated ejection fraction was in the range of 55% to 60%. Wall    motion was normal; there were no regional wall motion    abnormalities.  - Aortic valve: There was trivial regurgitation.  - Mitral valve: Systolic bowing without prolapse. There was    moderate regurgitation.  - Left atrium: The atrium was mildly dilated.  - Right atrium: The atrium was mildly dilated.  - Tricuspid valve: There was moderate regurgitation.  - Pulmonary arteries: Systolic pressure was moderately increased.    PA peak pressure: 50 mm Hg (S).   EKG:  EKG is ordered today.  The EKG ordered today demonstrates Afib, 70 bpm, no acute st/t changes  Recent Labs: 07/24/2020: BUN 18; Creatinine, Ser 1.08; Hemoglobin 12.8; Magnesium 1.9; Platelets 170; Potassium 4.3; Sodium 134  Recent Lipid Panel    Component Value Date/Time   CHOL 181 11/22/2010 1012   TRIG 82.0 11/22/2010 1012   HDL 54.40 11/22/2010 1012   CHOLHDL 3  11/22/2010 1012   VLDL 16.4 11/22/2010 1012   LDLCALC 110 (H) 11/22/2010 1012   LDLDIRECT 126.2 05/01/2007 0954    PHYSICAL EXAM:    VS:  BP 140/80 (BP Location: Left Arm, Patient Position:  Sitting, Cuff Size: Normal)    Pulse 70    Ht 5\' 7"  (1.702 m)    Wt 216 lb (98 kg)    SpO2 96%    BMI 33.83 kg/m   BMI: Body mass index is 33.83 kg/m.  Physical Exam Vitals reviewed.  Constitutional:      Appearance: He is well-developed.  HENT:     Head: Normocephalic and atraumatic.  Eyes:     General:        Right eye: No discharge.        Left eye: No discharge.  Neck:     Vascular: No JVD.  Cardiovascular:     Rate and Rhythm: Normal rate. Rhythm irregularly irregular.     Pulses:          Posterior tibial pulses are 2+ on the right side and 2+ on the left side.     Heart sounds: Normal heart sounds, S1 normal and S2 normal. Heart sounds not distant. No midsystolic click and no opening snap. No murmur heard.   No friction rub.  Pulmonary:     Effort: Pulmonary effort is normal. No respiratory distress.     Breath sounds: Normal breath sounds. No decreased breath sounds, wheezing or rales.  Chest:     Chest wall: No tenderness.  Abdominal:     General: There is no distension.     Palpations: Abdomen is soft.     Tenderness: There is no abdominal tenderness.  Musculoskeletal:     Cervical back: Normal range of motion.     Right lower leg: No edema.     Left lower leg: No edema.  Skin:    General: Skin is warm and dry.     Nails: There is no clubbing.  Neurological:     Mental Status: He is alert and oriented to person, place, and time.  Psychiatric:        Speech: Speech normal.        Behavior: Behavior normal.        Thought Content: Thought content normal.        Judgment: Judgment normal.    Wt Readings from Last 3 Encounters:  04/29/21 216 lb (98 kg)  10/27/20 210 lb (95.3 kg)  07/28/20 214 lb 9.6 oz (97.3 kg)     ASSESSMENT & PLAN:   Permanent A. fib: He remains in A. fib with controlled ventricular response.  He has been evaluated by EP with recommendation to pursue rate control strategy.  No longer on amiodarone in this setting.  He does continue to  note some cognitive decline/mental "fogginess."  This is of uncertain etiology.  We will decrease his metoprolol succinate from 25 mg twice daily to 12.5 mg twice daily to see if this helps with his cognitive status.  Ventricular rate precautions were discussed with this decreased dose of beta-blocker with recommendation for patient's son to increase back to 25 mg twice daily if heart rates become tachycardic.  He remains on indefinite apixaban given a CHA2DS2-VASc of at least 5 without any symptoms concerning for bleeding and a recent stable CBC.  HFpEF/pulmonary hypertension: Recent echo demonstrated preserved LV systolic function with stable mild mitral regurgitation and improved PASP.  He remains on low-dose furosemide.  He is well compensated.  HTN: Blood pressure is mildly elevated today.  We did not make a compensatory change to losartan with the decrease of metoprolol at this time.  If needed, based on BP trend we could consider adjusting losartan in follow-up.  HLD: LDL 81 in 02/2021.  He remains on pravastatin.   Disposition: F/u with Dr. Fletcher Anon or an APP in 1 month.   Medication Adjustments/Labs and Tests Ordered: Current medicines are reviewed at length with the patient today.  Concerns regarding medicines are outlined above. Medication changes, Labs and Tests ordered today are summarized above and listed in the Patient Instructions accessible in Encounters.   Signed, [name], PA-C 04/29/2021 5:05 PM     [employer] [address] [place], [place] [address] [phone]

## 2021-04-29 ENCOUNTER — Encounter: Payer: Self-pay | Admitting: Physician Assistant

## 2021-04-29 ENCOUNTER — Other Ambulatory Visit: Payer: Self-pay

## 2021-04-29 ENCOUNTER — Ambulatory Visit: Payer: Medicare HMO | Admitting: Physician Assistant

## 2021-04-29 VITALS — BP 140/80 | HR 70 | Ht 67.0 in | Wt 216.0 lb

## 2021-04-29 DIAGNOSIS — I10 Essential (primary) hypertension: Secondary | ICD-10-CM

## 2021-04-29 DIAGNOSIS — I4821 Permanent atrial fibrillation: Secondary | ICD-10-CM | POA: Diagnosis not present

## 2021-04-29 DIAGNOSIS — I272 Pulmonary hypertension, unspecified: Secondary | ICD-10-CM | POA: Diagnosis not present

## 2021-04-29 DIAGNOSIS — I5032 Chronic diastolic (congestive) heart failure: Secondary | ICD-10-CM | POA: Diagnosis not present

## 2021-04-29 DIAGNOSIS — E785 Hyperlipidemia, unspecified: Secondary | ICD-10-CM

## 2021-04-29 DIAGNOSIS — I700 Atherosclerosis of aorta: Secondary | ICD-10-CM

## 2021-04-29 MED ORDER — METOPROLOL SUCCINATE ER 25 MG PO TB24: 12.5000 mg | ORAL_TABLET | Freq: Two times a day (BID) | ORAL | 1 refills | Status: DC

## 2021-04-29 NOTE — Patient Instructions (Addendum)
Medication Instructions:  - Your physician has recommended you make the following change in your medication:   1) DECREASE Toprol XL (Metoprolol Succinate) 25 mg: - take 0.5 tablet (12.5 mg) by mouth TWICE daily   *If you need a refill on your cardiac medications before your next appointment, please call your pharmacy*   Lab Work: - none ordered  If you have labs (blood work) drawn today and your tests are completely normal, you will receive your results only by: [place] (if you have MyChart) OR A paper copy in the mail If you have any lab test that is abnormal or we need to change your treatment, we will call you to review the results.   Testing/Procedures: - none ordered   Follow-Up: At [facility], you and your health needs are our priority.  As part of our continuing mission to provide you with exceptional heart care, we have created designated Provider Care Teams.  These Care Teams include your primary Cardiologist (physician) and Advanced Practice Providers (APPs -  Physician Assistants and Nurse Practitioners) who all work together to provide you with the care you need, when you need it.  We recommend signing up for the patient portal called "MyChart".  Sign up information is provided on this After Visit Summary.  MyChart is used to connect with patients for Virtual Visits (Telemedicine).  Patients are able to view lab/test results, encounter notes, upcoming appointments, etc.  Non-urgent messages can be sent to your provider as well.   To learn more about what you can do with MyChart, go to [email or web address].    Your next appointment:   1 month(s)  The format for your next appointment:   In Person  Provider:   You may see [name], MD or one of the following Advanced Practice Providers on your designated Care Team:    [name], PA-C   :1}    Other Instructions N/a

## 2021-05-04 ENCOUNTER — Other Ambulatory Visit: Payer: Self-pay | Admitting: Dermatology

## 2021-05-12 ENCOUNTER — Other Ambulatory Visit: Payer: Self-pay | Admitting: Dermatology

## 2021-05-28 ENCOUNTER — Other Ambulatory Visit: Payer: Self-pay | Admitting: Dermatology

## 2021-06-09 ENCOUNTER — Other Ambulatory Visit: Payer: Self-pay

## 2021-06-09 ENCOUNTER — Encounter: Payer: Self-pay | Admitting: Physician Assistant

## 2021-06-09 ENCOUNTER — Ambulatory Visit: Payer: Medicare HMO | Admitting: Physician Assistant

## 2021-06-09 VITALS — BP 134/80 | HR 82 | Ht 67.0 in | Wt 210.0 lb

## 2021-06-09 DIAGNOSIS — I10 Essential (primary) hypertension: Secondary | ICD-10-CM | POA: Diagnosis not present

## 2021-06-09 DIAGNOSIS — I5032 Chronic diastolic (congestive) heart failure: Secondary | ICD-10-CM | POA: Diagnosis not present

## 2021-06-09 DIAGNOSIS — I4821 Permanent atrial fibrillation: Secondary | ICD-10-CM | POA: Diagnosis not present

## 2021-06-09 DIAGNOSIS — I272 Pulmonary hypertension, unspecified: Secondary | ICD-10-CM | POA: Diagnosis not present

## 2021-06-09 MED ORDER — DILTIAZEM HCL ER COATED BEADS 120 MG PO CP24: 120.0000 mg | ORAL_CAPSULE | Freq: Every day | ORAL | 3 refills | Status: DC

## 2021-06-09 NOTE — Progress Notes (Signed)
? ?Cardiology Office Note   ? ?Date:  06/09/2021  ? ?ID:  Zachary Green, DOB May 31, 1935, MRN [id number] ? ?PCP:  [name], MD  ?Cardiologist:  [name], MD  ?Electrophysiologist:  Vickie Epley, MD  ? ?Chief Complaint: Follow up ? ?History of Present Illness:  ? ?Zachary Green is a 86 y.o. male with history of permanent Afib on Eliquis s/p prior DCCV s/p repeat unsuccessful DCCV 06/07/2020 for recurrent Afib, aortic atherosclerosis, HFpEF/pulmonary hypertension, pulmonary nodules, BPH and prostate cancer s/p radiation therapy, HTN, HLD, prior tobacco use for 5 years in his 19s, and family history notable for Afib who presents for follow up A. fib.  ?  ?Echo in 2017 showed a normal LVSF, moderate MR/TR, and moderate pulmonary hypertension. He was treated with low dose Lasix with stable symptoms noted in follow up. Nuclear stress test in 2017 was normal.  ?  ?In follow up in 11/2019, he was maintaining sinus rhythm. ?  ?He was seen in 04/2020 with recurrent palpitations, dizziness, fatigue, and increased SOB that dated back to 03/2020. He was noted to be in Afib with ventricular rate of 100 bpm. He was on amiodarone 100 mg daily and was reloaded followed by increased dose of maintenance dose of 200 mg daily. His Toprol was also increased to 25 mg daily. Echo on 05/27/2020 showed an EF of 60-65%, no RWMA, indeterminate LV diastolic function parameters, normal RVSF and ventricular cavity size, mildly elevated PASP at 36.2 mmHg, moderately dilated left atrium at 4.6 cm, and mild mitral regurgitation. When he was seen in follow up in 05/2020, he remained in Afib with controlled ventricular response and continued to note exertional dyspnea.  He underwent briefly successful DCCV on 7/78/2423 without complication.  When he was seen in follow-up on 07/01/2020 he was noted to be in atrial flutter.  He noted persistent lightheadedness and fatigue which were largely unchanged when compared to his prior visits.  His  case was discussed with his primary cardiologist, and given he had failed amiodarone this was discontinued.  Toprol-XL was titrated to 37.'5mg'$  twice daily.  In an effort to prevent significant hypotension losartan was tapered to 50 mg.  He was referred to EP for further management, at the request of his cardiologist.  Upon establishing with EP in 07/2020, it was felt his underlying A. fib was not contributing to the majority of his symptoms with recommendation to pursue rate control.  Main complaint at that time was brain fog and headaches.  He was seen by EP in 10/2020 and continued to report fatigue and underlying brain fogginess.  It was unclear what was leading to this.  Metoprolol was decreased to 25 mg 2 times daily with recommendation for him to follow-up with his PCP.  Of note, MRI of the brain in 06/2020 showed no acute intracranial abnormality.  He was last seen in the office on 04/29/2021, and noted a significant improvement in his functional status and quality of life following a steroid hip injection.  He was without symptoms of angina or decompensation.  He did continue to note some cognitive decline and "fogginess."  Toprol was decreased from 25 mg to 12.5 mg twice daily.  ? ?He comes in today and is accompanied by his son.  Overall, he feels largely similar to how he was at his last visit, with largely stable exertional dyspnea and fatigue.  He is without symptoms of angina or decompensation.  His main complaint at this time continues to  be cognitive decline and mental "fogginess."  He did not notice a significant change in these symptoms following the decreasing of metoprolol.  He also did not notice a significant increase in his blood pressure or ventricular rates following lower dose metoprolol.  No falls, hematochezia, or melena. ? ? ?Labs independently reviewed: ?02/2021 - potassium 4.8, BUN 18, serum creatinine 1.0, albumin 4.5, AST/ALT normal, Hgb 13.6, PLT 163, A1c 5.9, TC 164, TG 135, HDL 55, LDL  81 ?06/2020 - magnesium 1.9 ?02/2016 - TSH normal ? ?Past Medical History:  ?Diagnosis Date  ? Actinic keratosis   ? Arthritis   ? Bladder neck contracture   ? BPH associated with nocturia   ? Chronic diastolic CHF (congestive heart failure) [facility]) cardiologist--- dr a. Rogue Jury  ? a. echo 12/17: 55-60%, no RWMA, trivial AI, moderate MR with systolic bowing without prolapse, moderate TR, mild biatrial enlargement, PASP 50 mmHg  ? Diverticulosis of colon   ? DOE (dyspnea on exertion)   ? 07-06-2020  per pt sob with chores around the house, with yard work, stairs, and long distances gets sob  ? ED (erectile dysfunction)   ? GERD (gastroesophageal reflux disease)   ? History of basal cell carcinoma (BCC) excision   ? excision's ;   09/ 2015 left upper lateral chest;  09/ 2016  right lateral top shoulder;  10/ 2017 right anterior clavical area   ? History of diverticulitis of colon   ? History of gastric polyp   ? followed by dr m. Guadlupe Spanish (gi)---  previous adenomatous low grade  ? History of urinary retention   ? Hyperlipidemia   ? Hypertension   ? followed by pcp and cardiology  ? Light-headed feeling   ? 07-06-2020  per pt when he goes in and out of afib get light headed  ? Macular degeneration, right eye   ? Multiple pulmonary nodules   ? followed by pcp  ? Persistent atrial fibrillation [facility]) followed by cardiology--- dr a. Rogue Jury -- first dx 12/ 2017  ? a. on Eliquis, amiodarone, Toprol; b. s/p successful dccv 03/28/16  and 06-07-2020; c. CHADS2VASc at least 3 (HTN, age x 2)  ? Personal history of colonic adenoma 05/23/2007  ? 04/2007 - 6 mm adenoma  ? Prostate cancer [facility]) urologist--- dr Diona Fanti  ? dx 2017 ,  post HIFU ablative therapy 07/ 2017 and completed external radiation 04/ 2019  ? Rosacea   ? followed by dr d. Nehemiah Massed (dermotology)  ? Schatzki's ring   ? grade 1 varices  ? Wears dentures   ? upper  ? Wears glasses   ? ? ?Past Surgical History:  ?Procedure Laterality Date  ? CARDIOVERSION N/A 06/07/2020  ?  Procedure: CARDIOVERSION;  Surgeon: Wellington Hampshire, MD;  Location: [facility] ORS;  Service: Cardiovascular;  Laterality: N/A;  ? CATARACT EXTRACTION W/ INTRAOCULAR LENS  IMPLANT, BILATERAL  2010 ;  2011  ? COLONOSCOPY  last one 2014  '@[facility]'$   ? ELECTROPHYSIOLOGIC STUDY N/A 03/28/2016  ? Procedure: CARDIOVERSION;  Surgeon: Minna Merritts, MD;  Location: [facility] ORS;  Service: Cardiovascular;  Laterality: N/A;  ? ESOPHAGOGASTRODUODENOSCOPY  last one 03-04-2018  '@[facility]'$   ? ESOPHAGOGASTRODUODENOSCOPY (EGD) WITH PROPOFOL N/A 08/18/2016  ? Procedure: ESOPHAGOGASTRODUODENOSCOPY (EGD) WITH PROPOFOL;  Surgeon: Lollie Sails, MD;  Location: [facility] ENDOSCOPY;  Service: Endoscopy;  Laterality: N/A;  ? GREEN LIGHT LASER TURP (TRANSURETHRAL RESECTION OF PROSTATE N/A 08/17/2015  ? Procedure: GREEN LIGHT LASER TURP (TRANSURETHRAL RESECTION OF PROSTATE;  Surgeon: Legrand Como  Farrel Conners, MD;  Location: [facility] ORS;  Service: Urology;  Laterality: N/A;  ? NM MYOVIEW LTD    ? [place]  ? pin placed  ? TONSILLECTOMY  1949  ? TRANSURETHRAL RESECTION OF BLADDER NECK N/A 07/12/2020  ? Procedure: TRANSURETHRAL RESECTION OF BLADDER NECK;  Surgeon: Franchot Gallo, MD;  Location: [facility];  Service: Urology;  Laterality: N/A;  ? UPPER ESOPHAGEAL ENDOSCOPIC ULTRASOUND (EUS) N/A 09/21/2016  ? Procedure: UPPER ESOPHAGEAL ENDOSCOPIC ULTRASOUND (EUS);  Surgeon: Reita Cliche, MD;  Location: [facility] ENDOSCOPY;  Service: Gastroenterology;  Laterality: N/A;  ? ? ?Current Medications: ?Current Meds  ?Medication Sig  ? Cholecalciferol (VITAMIN D3) 50 MCG (2000 UT) TABS Take 2,000 Units by mouth daily after supper.  ? Diclofenac Sodium 3 % GEL Apply topically.  ? diltiazem (CARDIZEM CD) 120 MG 24 hr capsule Take 1 capsule (120 mg total) by mouth daily.  ? docusate sodium (COLACE) 100 MG capsule Take 100 mg by mouth 2 (two) times daily as needed (constipation).  ? doxycycline (VIBRAMYCIN) 50 MG capsule 1 CAPSULE BY MOUTH DAILY TAKE  WITH FOOD AND PLENTY OF FLUID  ? ELIQUIS 5 MG TABS tablet TAKE 1 TABLET BY MOUTH TWICE A DAY  ? esomeprazole (NEXIUM) 20 MG capsule Take 20 mg by mouth daily before breakfast.  ? furosemide (LASIX) 2

## 2021-06-09 NOTE — Patient Instructions (Signed)
Medication Instructions:  ?Your physician has recommended you make the following change in your medication:  ? ?STOP Metoprolol ?START Cardizem 120 mg once a day ? ?*If you need a refill on your cardiac medications before your next appointment, please call your pharmacy* ? ? ?Lab Work: ?None ? ?If you have labs (blood work) drawn today and your tests are completely normal, you will receive your results only by: ?MyChart Message (if you have MyChart) OR ?A paper copy in the mail ?If you have any lab test that is abnormal or we need to change your treatment, we will call you to review the results. ? ? ?Testing/Procedures: ?None ? ? ?Follow-Up: ?At [facility], you and your health needs are our priority.  As part of our continuing mission to provide you with exceptional heart care, we have created designated Provider Care Teams.  These Care Teams include your primary Cardiologist (physician) and Advanced Practice Providers (APPs -  Physician Assistants and Nurse Practitioners) who all work together to provide you with the care you need, when you need it. ? ? ?Your next appointment:   ?1 month(s) ? ?The format for your next appointment:   ?In Person ? ?Provider:   ?[name], MD or [name], PA-C  ?

## 2021-07-10 NOTE — Progress Notes (Signed)
? ?Cardiology Office Note   ? ?Date:  07/11/2021  ? ?ID:  Zachary Green, DOB 09-23-1935, MRN [id number] ? ?PCP:  [name], MD  ?Cardiologist:  [name], MD  ?Electrophysiologist:  Vickie Epley, MD  ? ?Chief Complaint: Follow up ? ?History of Present Illness:  ? ?Zachary Green is a 86 y.o. male with history of permanent Afib on Eliquis s/p prior DCCV s/p repeat unsuccessful DCCV 06/07/2020 for recurrent Afib, aortic atherosclerosis, HFpEF/pulmonary hypertension, pulmonary nodules, BPH and prostate cancer s/p radiation therapy, HTN, HLD, prior tobacco use for 5 years in his 12s, and family history notable for Afib who presents for follow up A. fib.  ?  ?Echo in 2017 showed a normal LVSF, moderate MR/TR, and moderate pulmonary hypertension. He was treated with low dose Lasix with stable symptoms noted in follow up. Nuclear stress test in 2017 was normal.  ?  ?In follow up in 11/2019, he was maintaining sinus rhythm. ?  ?He was seen in 04/2020 with recurrent palpitations, dizziness, fatigue, and increased SOB that dated back to 03/2020. He was noted to be in Afib with ventricular rate of 100 bpm. He was on amiodarone 100 mg daily and was reloaded followed by increased dose of maintenance dose of 200 mg daily. His Toprol was also increased to 25 mg daily. Echo on 05/27/2020 showed an EF of 60-65%, no RWMA, indeterminate LV diastolic function parameters, normal RVSF and ventricular cavity size, mildly elevated PASP at 36.2 mmHg, moderately dilated left atrium at 4.6 cm, and mild mitral regurgitation. When he was seen in follow up in 05/2020, he remained in Afib with controlled ventricular response and continued to note exertional dyspnea.  He underwent briefly successful DCCV on 09/25/6376 without complication.  When he was seen in follow-up on 07/01/2020 he was noted to be in atrial flutter.  He noted persistent lightheadedness and fatigue which were largely unchanged when compared to his prior visits.  His  case was discussed with his primary cardiologist, and given he had failed amiodarone this was discontinued.  Toprol-XL was titrated to 37.'5mg'$  twice daily.  In an effort to prevent significant hypotension losartan was tapered to 50 mg.  He was referred to EP for further management, at the request of his cardiologist.  Upon establishing with EP in 07/2020, it was felt his underlying A. fib was not contributing to the majority of his symptoms with recommendation to pursue rate control.  Main complaint at that time was brain fog and headaches.  He was seen by EP in 10/2020 and continued to report fatigue and underlying brain fogginess.  It was unclear what was leading to this.  Metoprolol was decreased to 25 mg 2 times daily with recommendation for him to follow-up with his PCP.  Of note, MRI of the brain in 06/2020 showed no acute intracranial abnormality.  He was seen in the office on 04/29/2021, and noted a significant improvement in his functional status and quality of life following a steroid hip injection.  He was without symptoms of angina or decompensation.  He did continue to note some cognitive decline and "fogginess."  Toprol was decreased from 25 mg to 12.5 mg twice daily.  He was last seen in the office on 06/09/2021 and overall felt similar to how he did at his last visit with stable exertional dyspnea and fatigue, without symptoms of angina or decompensation.  His main complaint at that time continues to be cognitive decline and mental "fogginess."  He did  not notice a significant change in the symptoms following the decreasing of metoprolol.  BP and ventricular rates remained well controlled.  We discontinued metoprolol altogether and underwent a trial of Cardizem CD 120 mg daily.  ? ?He comes in today and is accompanied by his son.  He feels the same as he has had his past prior several visits with stable exertional fatigue, mental "fogginess", and headache.  He did not notice any change in the way he feels  off beta-blocker.  He has tolerated the addition of Cardizem CD without significant issues or lower extremity swelling.  He is wearing compression stockings.  BP at home has largely been in the 258N to 277O systolic with occasional reading in the 1 teens to 242P systolic.  Heart rate for the most part has been in the 80s to 90s bpm.  No symptoms concerning for bleeding. ? ? ?Labs independently reviewed: ?02/2021 - potassium 4.8, BUN 18, serum creatinine 1.0, albumin 4.5, AST/ALT normal, Hgb 13.6, PLT 163, A1c 5.9, TC 164, TG 135, HDL 55, LDL 81 ?06/2020 - magnesium 1.9 ?02/2016 - TSH normal ? ?Past Medical History:  ?Diagnosis Date  ? Actinic keratosis   ? Arthritis   ? Bladder neck contracture   ? BPH associated with nocturia   ? Chronic diastolic CHF (congestive heart failure) [facility]) cardiologist--- dr a. Rogue Jury  ? a. echo 12/17: 55-60%, no RWMA, trivial AI, moderate MR with systolic bowing without prolapse, moderate TR, mild biatrial enlargement, PASP 50 mmHg  ? Diverticulosis of colon   ? DOE (dyspnea on exertion)   ? 07-06-2020  per pt sob with chores around the house, with yard work, stairs, and long distances gets sob  ? ED (erectile dysfunction)   ? GERD (gastroesophageal reflux disease)   ? History of basal cell carcinoma (BCC) excision   ? excision's ;   09/ 2015 left upper lateral chest;  09/ 2016  right lateral top shoulder;  10/ 2017 right anterior clavical area   ? History of diverticulitis of colon   ? History of gastric polyp   ? followed by dr m. Guadlupe Spanish (gi)---  previous adenomatous low grade  ? History of urinary retention   ? Hyperlipidemia   ? Hypertension   ? followed by pcp and cardiology  ? Light-headed feeling   ? 07-06-2020  per pt when he goes in and out of afib get light headed  ? Macular degeneration, right eye   ? Multiple pulmonary nodules   ? followed by pcp  ? Persistent atrial fibrillation [facility]) followed by cardiology--- dr a. Rogue Jury -- first dx 12/ 2017  ? a. on Eliquis,  amiodarone, Toprol; b. s/p successful dccv 03/28/16  and 06-07-2020; c. CHADS2VASc at least 3 (HTN, age x 2)  ? Personal history of colonic adenoma 05/23/2007  ? 04/2007 - 6 mm adenoma  ? Prostate cancer [facility]) urologist--- dr Diona Fanti  ? dx 2017 ,  post HIFU ablative therapy 07/ 2017 and completed external radiation 04/ 2019  ? Rosacea   ? followed by dr d. Nehemiah Massed (dermotology)  ? Schatzki's ring   ? grade 1 varices  ? Wears dentures   ? upper  ? Wears glasses   ? ? ?Past Surgical History:  ?Procedure Laterality Date  ? CARDIOVERSION N/A 06/07/2020  ? Procedure: CARDIOVERSION;  Surgeon: Wellington Hampshire, MD;  Location: [facility] ORS;  Service: Cardiovascular;  Laterality: N/A;  ? CATARACT EXTRACTION W/ INTRAOCULAR LENS  IMPLANT, BILATERAL  2010 ;  2011  ? COLONOSCOPY  last one 2014  '@[facility]'$   ? ELECTROPHYSIOLOGIC STUDY N/A 03/28/2016  ? Procedure: CARDIOVERSION;  Surgeon: Minna Merritts, MD;  Location: [facility] ORS;  Service: Cardiovascular;  Laterality: N/A;  ? ESOPHAGOGASTRODUODENOSCOPY  last one 03-04-2018  '@[facility]'$   ? ESOPHAGOGASTRODUODENOSCOPY (EGD) WITH PROPOFOL N/A 08/18/2016  ? Procedure: ESOPHAGOGASTRODUODENOSCOPY (EGD) WITH PROPOFOL;  Surgeon: Lollie Sails, MD;  Location: [facility] ENDOSCOPY;  Service: Endoscopy;  Laterality: N/A;  ? GREEN LIGHT LASER TURP (TRANSURETHRAL RESECTION OF PROSTATE N/A 08/17/2015  ? Procedure: GREEN LIGHT LASER TURP (TRANSURETHRAL RESECTION OF PROSTATE;  Surgeon: Royston Cowper, MD;  Location: [facility] ORS;  Service: Urology;  Laterality: N/A;  ? NM MYOVIEW LTD    ? [place]  ? pin placed  ? TONSILLECTOMY  1949  ? TRANSURETHRAL RESECTION OF BLADDER NECK N/A 07/12/2020  ? Procedure: TRANSURETHRAL RESECTION OF BLADDER NECK;  Surgeon: Franchot Gallo, MD;  Location: [facility];  Service: Urology;  Laterality: N/A;  ? UPPER ESOPHAGEAL ENDOSCOPIC ULTRASOUND (EUS) N/A 09/21/2016  ? Procedure: UPPER ESOPHAGEAL ENDOSCOPIC ULTRASOUND (EUS);  Surgeon: Reita Cliche,  MD;  Location: [facility] ENDOSCOPY;  Service: Gastroenterology;  Laterality: N/A;  ? ? ?Current Medications: ?Current Meds  ?Medication Sig  ? Cholecalciferol (VITAMIN D3) 50 MCG (2000 UT) TABS Take 2,000 Units by

## 2021-07-11 ENCOUNTER — Ambulatory Visit: Payer: Medicare HMO | Admitting: Physician Assistant

## 2021-07-11 ENCOUNTER — Encounter: Payer: Self-pay | Admitting: Physician Assistant

## 2021-07-11 VITALS — BP 138/72 | HR 79 | Ht 67.0 in | Wt 215.8 lb

## 2021-07-11 DIAGNOSIS — R5381 Other malaise: Secondary | ICD-10-CM

## 2021-07-11 DIAGNOSIS — E785 Hyperlipidemia, unspecified: Secondary | ICD-10-CM

## 2021-07-11 DIAGNOSIS — R5383 Other fatigue: Secondary | ICD-10-CM

## 2021-07-11 DIAGNOSIS — I272 Pulmonary hypertension, unspecified: Secondary | ICD-10-CM

## 2021-07-11 DIAGNOSIS — I5032 Chronic diastolic (congestive) heart failure: Secondary | ICD-10-CM | POA: Diagnosis not present

## 2021-07-11 DIAGNOSIS — I10 Essential (primary) hypertension: Secondary | ICD-10-CM | POA: Diagnosis not present

## 2021-07-11 DIAGNOSIS — I4821 Permanent atrial fibrillation: Secondary | ICD-10-CM

## 2021-07-11 NOTE — Patient Instructions (Signed)
Medication Instructions:  ?Hold pravastatin until your next visit. ? ?*If you need a refill on your cardiac medications before your next appointment, please call your pharmacy* ? ? ?Lab Work: ?None ? ?If you have labs (blood work) drawn today and your tests are completely normal, you will receive your results only by: ?MyChart Message (if you have MyChart) OR ?A paper copy in the mail ?If you have any lab test that is abnormal or we need to change your treatment, we will call you to review the results. ? ? ?Testing/Procedures: ?None ? ? ?Follow-Up: ?At [facility], you and your health needs are our priority.  As part of our continuing mission to provide you with exceptional heart care, we have created designated Provider Care Teams.  These Care Teams include your primary Cardiologist (physician) and Advanced Practice Providers (APPs -  Physician Assistants and Nurse Practitioners) who all work together to provide you with the care you need, when you need it. ? ? ?Your next appointment:   ?1 month(s) ? ?The format for your next appointment:   ?In Person ? ?Provider:   ?[name], MD or [name], PA-C ? ? ? ? ? ?Important Information About Sugar ? ? ? ? ?  ?

## 2021-07-21 ENCOUNTER — Other Ambulatory Visit: Payer: Self-pay | Admitting: Cardiology

## 2021-08-15 NOTE — Progress Notes (Unsigned)
Cardiology Office Note    Date:  08/17/2021   ID:  NILO FALLIN, DOB Sep 30, 1935, MRN [id number]  PCP:  [name], MD  Cardiologist:  [name], MD  Electrophysiologist:  Vickie Epley, MD   Chief Complaint: Follow-up  History of Present Illness:   Zachary Green is a 86 y.o. male with history of permanent Afib on Eliquis s/p prior DCCV s/p repeat unsuccessful DCCV 06/07/2020 for recurrent Afib, aortic atherosclerosis, HFpEF/pulmonary hypertension, pulmonary nodules, BPH and prostate cancer s/p radiation therapy, HTN, HLD, prior tobacco use for 5 years in his 77s, and family history notable for Afib who presents for follow up A. fib.    Echo in 2017 showed a normal LVSF, moderate MR/TR, and moderate pulmonary hypertension. He was treated with low dose Lasix with stable symptoms noted in follow up. Nuclear stress test in 2017 was normal.    In follow up in 11/2019, he was maintaining sinus rhythm.   He was seen in 04/2020 with recurrent palpitations, dizziness, fatigue, and increased SOB that dated back to 03/2020. He was noted to be in Afib with ventricular rate of 100 bpm. He was on amiodarone 100 mg daily and was reloaded followed by increased dose of maintenance dose of 200 mg daily. His Toprol was also increased to 25 mg daily. Echo on 05/27/2020 showed an EF of 60-65%, no RWMA, indeterminate LV diastolic function parameters, normal RVSF and ventricular cavity size, mildly elevated PASP at 36.2 mmHg, moderately dilated left atrium at 4.6 cm, and mild mitral regurgitation. When he was seen in follow up in 05/2020, he remained in Afib with controlled ventricular response and continued to note exertional dyspnea.  He underwent briefly successful DCCV on 7/89/3810 without complication.  When he was seen in follow-up on 07/01/2020 he was noted to be in atrial flutter.  He noted persistent lightheadedness and fatigue which were largely unchanged when compared to his prior visits.  His  case was discussed with his primary cardiologist, and given he had failed amiodarone this was discontinued.  Toprol-XL was titrated to 37.'5mg'$  twice daily.  In an effort to prevent significant hypotension losartan was tapered to 50 mg.  He was referred to EP for further management, at the request of his cardiologist.  Upon establishing with EP in 07/2020, it was felt his underlying A. fib was not contributing to the majority of his symptoms with recommendation to pursue rate control.  Main complaint at that time was brain fog and headaches.  He was seen by EP in 10/2020 and continued to report fatigue and underlying brain fogginess.  It was unclear what was leading to this.  Metoprolol was decreased to 25 mg 2 times daily with recommendation for him to follow-up with his PCP.  Of note, MRI of the brain in 06/2020 showed no acute intracranial abnormality.  He was seen in the office on 04/29/2021, and noted a significant improvement in his functional status and quality of life following a steroid hip injection.  He was without symptoms of angina or decompensation.  He did continue to note some cognitive decline and "fogginess."  Toprol was decreased from 25 mg to 12.5 mg twice daily.  He was seen in the office on 06/09/2021 and overall felt similar to how he did at his last visit with stable exertional dyspnea and fatigue, without symptoms of angina or decompensation.  His main complaint at that time continued to be cognitive decline and mental "fogginess."  He did not notice  a significant change in the symptoms following the decreasing of metoprolol.  BP and ventricular rates remained well controlled.  We discontinued metoprolol altogether and underwent a trial of Cardizem CD 120 mg daily.  He was last seen in the office on 07/11/2021 and continued to feel the same as he had at his prior visits without any significant change in the way he felt off beta-blocker.  He was tolerating Cardizem CD.  We underwent a trial of holding  his statin to see if this would improve his malaise, fatigue, and mental "fogginess."  He comes in today accompanied by his son and continues to feel the same as he has at his prior visits with ongoing generalized malaise, fatigue, lightheadedness, and mental fogginess with headache.  He is frustrated at his persistent symptoms.  He also ran out of losartan for 2 days while out of town while his bathroom was being remodeled.  With this, he noted elevated BP readings approximately 10 mmHg greater in the systolic and diastolic readings with associated headache.  He did not notice any change or improvement in his generalized malaise or fatigue while off losartan.  He did not note any significant change in symptoms off pravastatin.  No falls or symptoms concerning for bleeding.  He remains adherent to anticoagulation and has not missed any doses.  Of note, he was previously on amiodarone while pursuing rhythm control, though this medication was discontinued due to the above constellation of symptoms.  However, despite discontinuation of this medication, his symptoms have persisted.   Labs independently reviewed: 02/2021 - potassium 4.8, BUN 18, serum creatinine 1.0, albumin 4.5, AST/ALT normal, Hgb 13.6, PLT 163, A1c 5.9, TC 164, TG 135, HDL 55, LDL 81 06/2020 - magnesium 1.9 02/2016 - TSH normal  Past Medical History:  Diagnosis Date   Actinic keratosis    Arthritis    Bladder neck contracture    BPH associated with nocturia    Chronic diastolic CHF (congestive heart failure) [facility]) cardiologist--- dr a. muhammad   a. echo 12/17: 55-60%, no RWMA, trivial AI, moderate MR with systolic bowing without prolapse, moderate TR, mild biatrial enlargement, PASP 50 mmHg   Diverticulosis of colon    DOE (dyspnea on exertion)    07-06-2020  per pt sob with chores around the house, with yard work, stairs, and long distances gets sob   ED (erectile dysfunction)    GERD (gastroesophageal reflux disease)     History of basal cell carcinoma (BCC) excision    excision's ;   09/ 2015 left upper lateral chest;  09/ 2016  right lateral top shoulder;  10/ 2017 right anterior clavical area    History of diverticulitis of colon    History of gastric polyp    followed by dr Jerilynn Mages. Guadlupe Spanish (gi)---  previous adenomatous low grade   History of urinary retention    Hyperlipidemia    Hypertension    followed by pcp and cardiology   Light-headed feeling    07-06-2020  per pt when he goes in and out of afib get light headed   Macular degeneration, right eye    Multiple pulmonary nodules    followed by pcp   Persistent atrial fibrillation [facility]) followed by cardiology--- dr a. Rogue Jury -- first dx 12/ 2017   a. on Eliquis, amiodarone, Toprol; b. s/p successful dccv 03/28/16  and 06-07-2020; c. CHADS2VASc at least 3 (HTN, age x 2)   Personal history of colonic adenoma 05/23/2007   04/2007 - 6  mm adenoma   Prostate cancer [facility]) urologist--- dr Diona Fanti   dx 2017 ,  post HIFU ablative therapy 07/ 2017 and completed external radiation 04/ 2019   Rosacea    followed by dr d. Nehemiah Massed (dermotology)   Schatzki's ring    grade 1 varices   Wears dentures    upper   Wears glasses     Past Surgical History:  Procedure Laterality Date   CARDIOVERSION N/A 06/07/2020   Procedure: CARDIOVERSION;  Surgeon: Wellington Hampshire, MD;  Location: [facility] ORS;  Service: Cardiovascular;  Laterality: N/A;   CATARACT EXTRACTION W/ INTRAOCULAR LENS  IMPLANT, BILATERAL  2010 ;  2011   COLONOSCOPY  last one 2014  '@[facility]'$    ELECTROPHYSIOLOGIC STUDY N/A 03/28/2016   Procedure: CARDIOVERSION;  Surgeon: Minna Merritts, MD;  Location: [facility] ORS;  Service: Cardiovascular;  Laterality: N/A;   ESOPHAGOGASTRODUODENOSCOPY  last one 03-04-2018  '@[facility]'$    ESOPHAGOGASTRODUODENOSCOPY (EGD) WITH PROPOFOL N/A 08/18/2016   Procedure: ESOPHAGOGASTRODUODENOSCOPY (EGD) WITH PROPOFOL;  Surgeon: Lollie Sails, MD;  Location: [facility] ENDOSCOPY;  Service: Endoscopy;   Laterality: N/A;   GREEN LIGHT LASER TURP (TRANSURETHRAL RESECTION OF PROSTATE N/A 08/17/2015   Procedure: GREEN LIGHT LASER TURP (TRANSURETHRAL RESECTION OF PROSTATE;  Surgeon: Royston Cowper, MD;  Location: [facility] ORS;  Service: Urology;  Laterality: N/A;   NM MYOVIEW LTD     PATELLA FRACTURE SURGERY  1984   pin placed   TONSILLECTOMY  1949   TRANSURETHRAL RESECTION OF BLADDER NECK N/A 07/12/2020   Procedure: TRANSURETHRAL RESECTION OF BLADDER NECK;  Surgeon: Franchot Gallo, MD;  Location: [facility];  Service: Urology;  Laterality: N/A;   UPPER ESOPHAGEAL ENDOSCOPIC ULTRASOUND (EUS) N/A 09/21/2016   Procedure: UPPER ESOPHAGEAL ENDOSCOPIC ULTRASOUND (EUS);  Surgeon: Reita Cliche, MD;  Location: [facility];  Service: Gastroenterology;  Laterality: N/A;    Current Medications: Current Meds  Medication Sig   amiodarone (PACERONE) 200 MG tablet Take 1 tablet (200 mg total) by mouth 2 (two) times daily for 14 days, THEN 1 tablet (200 mg total) daily for 16 days.   [START ON 09/15/2021] amiodarone (PACERONE) 200 MG tablet Take 1 tablet (200 mg total) by mouth daily.   Cholecalciferol (VITAMIN D3) 50 MCG (2000 UT) TABS Take 2,000 Units by mouth daily after supper.   Diclofenac Sodium 3 % GEL Apply 1 application. topically as needed (pain).   diltiazem (CARDIZEM CD) 120 MG 24 hr capsule Take 1 capsule (120 mg total) by mouth daily.   docusate sodium (COLACE) 100 MG capsule Take 100 mg by mouth 2 (two) times daily as needed (constipation).   doxycycline (VIBRAMYCIN) 50 MG capsule 1 CAPSULE BY MOUTH DAILY TAKE WITH FOOD AND PLENTY OF FLUID   ELIQUIS 5 MG TABS tablet TAKE 1 TABLET BY MOUTH TWICE A DAY   esomeprazole (NEXIUM) 20 MG capsule Take 20 mg by mouth daily before breakfast.   furosemide (LASIX) 20 MG tablet TAKE 1 TABLET BY MOUTH EVERY DAY   losartan (COZAAR) 50 MG tablet TAKE 1 TABLET BY MOUTH EVERY DAY   metroNIDAZOLE (METROGEL) 1 % gel APPLY 1 GRAM ON THE SKIN AT  BEDTIME   Multiple Vitamins-Minerals (PRESERVISION AREDS PO) Take 1 tablet by mouth in the morning and at bedtime.   mupirocin ointment (BACTROBAN) 2 % Apply 1 application. topically as needed (rash).   Omega-3 Fatty Acids (FISH OIL) 1000 MG CAPS Take 1,000 mg by mouth in the morning and at bedtime.   permethrin (ELIMITE)  5 % cream Apply 1 application topically 2 (two) times daily. Applied to cheeks   Polyethyl Glycol-Propyl Glycol (SYSTANE OP) Place 1 drop into both eyes 2 (two) times daily as needed (scheduled in the morning & in the evening if needed for dry/irritated eyes.).   pravastatin (PRAVACHOL) 40 MG tablet TAKE ONE TABLET BY MOUTH EVERY DAY   tamsulosin (FLOMAX) 0.4 MG CAPS capsule Take 1 capsule (0.4 mg total) by mouth daily after supper.   vitamin E 180 MG (400 UNITS) capsule Take 400 Units by mouth in the morning.    Allergies:   Codeine   Social History   Socioeconomic History   Marital status: Married    Spouse name: Katharine Look   Number of children: 4   Years of education: Not on file   Highest education level: Not on file  Occupational History   Occupation: retired Teacher, music: retired  Tobacco Use   Smoking status: Former    Packs/day: 1.00    Years: 3.00    Pack years: 3.00    Types: Cigarettes    Quit date: 03/28/1959    Years since quitting: 62.4   Smokeless tobacco: Former    Types: Chew    Quit date: 07/06/1968  Vaping Use   Vaping Use: Never used  Substance and Sexual Activity   Alcohol use: No   Drug use: Never   Sexual activity: Not on file  Other Topics Concern   Not on file  Social History Narrative   Has living will   Wife is [place] [facility] care POA (then oldest son, Sherren Mocha)   Not sure about DNR---will accept resuscitation for now   Probably would not want a feeding tube   Social Determinants of Health   Financial Resource Strain: Not on file  Food Insecurity: Not on file  Transportation Needs: Not on file  Physical Activity: Not on  file  Stress: Not on file  Social Connections: Not on file     Family History:  The patient's family history includes Alzheimer's disease in his mother; Cancer in his mother; Diabetes in his brother; Heart disease in his brother; Lung cancer in his brother.  ROS:   12-point review of systems is negative unless otherwise noted in the HPI.   EKGs/Labs/Other Studies Reviewed:    Studies reviewed were summarized above. The additional studies were reviewed today:  Lexiscan MPI 07/19/2020: There was no ST segment deviation noted during stress. The study is normal. The left ventricular ejection fraction is normal (55-65%). This is a low risk study. CT attenuation images showed mild coronary and aortic calcifications. __________   2D echo 05/27/2020: 1. Left ventricular ejection fraction, by estimation, is 60 to 65%. The  left ventricle has normal function. The left ventricle has no regional  wall motion abnormalities. There is moderate left ventricular hypertrophy.  Left ventricular diastolic  parameters are indeterminate.   2. Right ventricular systolic function is normal. The right ventricular  size is normal. There is mildly elevated pulmonary artery systolic  pressure.   3. Left atrial size was moderately dilated.   4. The mitral valve is normal in structure. Mild mitral valve  Regurgitation. __________   Carlton Adam MPI 03/16/2016: There was no ST segment deviation noted during stress. The study is normal. This is a low risk study. The left ventricular ejection fraction is normal (55-65%). __________   2D echo 03/09/2016: - Left ventricle: The cavity size was normal. There was moderate  concentric hypertrophy. Systolic function was normal. The    estimated ejection fraction was in the range of 55% to 60%. Wall    motion was normal; there were no regional wall motion    abnormalities.  - Aortic valve: There was trivial regurgitation.  - Mitral valve: Systolic bowing  without prolapse. There was    moderate regurgitation.  - Left atrium: The atrium was mildly dilated.  - Right atrium: The atrium was mildly dilated.  - Tricuspid valve: There was moderate regurgitation.  - Pulmonary arteries: Systolic pressure was moderately increased.    PA peak pressure: 50 mm Hg (S).   EKG:  EKG is ordered today.  The EKG ordered today demonstrates A-fib, 90 bpm,, no acute ST-T changes  Recent Labs: 08/17/2021: Hemoglobin 12.9; Platelets 172  Recent Lipid Panel    Component Value Date/Time   CHOL 181 11/22/2010 1012   TRIG 82.0 11/22/2010 1012   HDL 54.40 11/22/2010 1012   CHOLHDL 3 11/22/2010 1012   VLDL 16.4 11/22/2010 1012   LDLCALC 110 (H) 11/22/2010 1012   LDLDIRECT 126.2 05/01/2007 0954    PHYSICAL EXAM:    VS:  BP 132/70   Pulse 90   Ht '5\' 7"'$  (1.702 m)   Wt 215 lb (97.5 kg)   SpO2 95%   BMI 33.67 kg/m   BMI: Body mass index is 33.67 kg/m.  Physical Exam Constitutional:      Appearance: He is well-developed.  HENT:     Head: Normocephalic and atraumatic.  Eyes:     General:        Right eye: No discharge.        Left eye: No discharge.  Cardiovascular:     Rate and Rhythm: Normal rate. Rhythm irregularly irregular.     Pulses:          Posterior tibial pulses are 2+ on the right side and 2+ on the left side.     Heart sounds: Normal heart sounds, S1 normal and S2 normal. Heart sounds not distant. No midsystolic click and no opening snap. No murmur heard.   No friction rub.  Pulmonary:     Effort: Pulmonary effort is normal. No respiratory distress.     Breath sounds: Normal breath sounds. No decreased breath sounds, wheezing or rales.  Chest:     Chest wall: No tenderness.  Abdominal:     General: There is no distension.     Palpations: Abdomen is soft.  Musculoskeletal:     Cervical back: Normal range of motion.  Skin:    General: Skin is warm and dry.     Nails: There is no clubbing.  Neurological:     Mental Status: He is  alert and oriented to person, place, and time.  Psychiatric:        Speech: Speech normal.        Behavior: Behavior normal.        Thought Content: Thought content normal.        Judgment: Judgment normal.    Wt Readings from Last 3 Encounters:  08/17/21 215 lb (97.5 kg)  07/11/21 215 lb 12.8 oz (97.9 kg)  06/09/21 210 lb (95.3 kg)     ASSESSMENT & PLAN:   Permanent A-fib: He remains in A-fib with controlled ventricular response.  Prior DCCV was unsuccessful and plan was to pursue rate control strategy.  However, I do wonder if he would feel better in sinus rhythm.  He was previously on amiodarone, though  this was discontinued as it was felt his constellation of symptoms of malaise, fatigue, lightheadedness, and mental fogginess were in the setting of this medication.  However, despite discontinuing amiodarone, the symptoms have persisted.  We will reload him with amiodarone 200 mg twice daily for 2 weeks followed by 200 mg daily thereafter with plans to repeat a DCCV in attempt to restore normal rhythm and see if this improves his constellation of symptoms.  He will continue Cardizem CD 120 mg daily.  We are avoiding beta-blockers given underlying generalized malaise and fatigue.  Given a CHA2DS2-VASc of at least 5, he remains on apixaban without symptoms concerning for bleeding.  Check TSH, CMP, and CBC.  HFpEF/pulmonary hypertension: He appears euvolemic and well compensated.  Recent echo demonstrated preserved LV systolic function with stable mild mitral regurgitation and improved PASP.  He remains on low-dose furosemide.  Check renal function and electrolytes.  HTN: Blood pressure is well controlled in the office today.  He remains on Cardizem CD and losartan.  HLD: 81 in 02/2021.  He did not notice any significant improvement in symptoms off pravastatin.  Okay to resume statin therapy.  Generalized malaise/fatigue and mental "fogginess": Uncertain etiology to his underlying symptoms.   We have undergone tapering and ultimate discontinuation of beta-blocker along with statin holiday and discontinuation of amiodarone without significant improvement in symptoms.  I do not think his symptoms are related to ARB.  We will undergo a retrial of restoring normal rhythm and reassess there after.  Recent Lexiscan MPI was low risk.    Disposition: F/u with Dr. Fletcher Anon or an APP in 4 weeks, and EP as directed.   Medication Adjustments/Labs and Tests Ordered: Current medicines are reviewed at length with the patient today.  Concerns regarding medicines are outlined above. Medication changes, Labs and Tests ordered today are summarized above and listed in the Patient Instructions accessible in Encounters.   Signed, [name], PA-C 08/17/2021 4:36 PM     [address] [place], [place] [address] [phone]

## 2021-08-17 ENCOUNTER — Encounter: Payer: Self-pay | Admitting: Physician Assistant

## 2021-08-17 ENCOUNTER — Ambulatory Visit: Payer: Medicare HMO | Admitting: Physician Assistant

## 2021-08-17 ENCOUNTER — Other Ambulatory Visit
Admission: RE | Admit: 2021-08-17 | Discharge: 2021-08-17 | Disposition: A | Discharge status: Home / Self Care | Payer: Medicare HMO | Source: Ambulatory Visit | Attending: Physician Assistant | Admitting: Physician Assistant

## 2021-08-17 VITALS — BP 132/70 | HR 90 | Ht 67.0 in | Wt 215.0 lb

## 2021-08-17 DIAGNOSIS — I5032 Chronic diastolic (congestive) heart failure: Secondary | ICD-10-CM | POA: Diagnosis present

## 2021-08-17 DIAGNOSIS — I272 Pulmonary hypertension, unspecified: Secondary | ICD-10-CM | POA: Diagnosis not present

## 2021-08-17 DIAGNOSIS — I4821 Permanent atrial fibrillation: Secondary | ICD-10-CM | POA: Diagnosis present

## 2021-08-17 DIAGNOSIS — K219 Gastro-esophageal reflux disease without esophagitis: Secondary | ICD-10-CM | POA: Insufficient documentation

## 2021-08-17 DIAGNOSIS — E785 Hyperlipidemia, unspecified: Secondary | ICD-10-CM

## 2021-08-17 DIAGNOSIS — R5383 Other fatigue: Secondary | ICD-10-CM

## 2021-08-17 DIAGNOSIS — R7303 Prediabetes: Secondary | ICD-10-CM | POA: Insufficient documentation

## 2021-08-17 DIAGNOSIS — I10 Essential (primary) hypertension: Secondary | ICD-10-CM

## 2021-08-17 DIAGNOSIS — R5381 Other malaise: Secondary | ICD-10-CM

## 2021-08-17 LAB — CBC
HCT: 40.5 % (ref 39.0–52.0)
Hemoglobin: 12.9 g/dL — ABNORMAL LOW (ref 13.0–17.0)
MCH: 28.5 pg (ref 26.0–34.0)
MCHC: 31.9 g/dL (ref 30.0–36.0)
MCV: 89.4 fL (ref 80.0–100.0)
Platelets: 172 10*3/uL (ref 150–400)
RBC: 4.53 MIL/uL (ref 4.22–5.81)
RDW: 14.7 % (ref 11.5–15.5)
WBC: 4.2 10*3/uL (ref 4.0–10.5)
nRBC: 0 % (ref 0.0–0.2)

## 2021-08-17 LAB — TSH: TSH: 2.501 u[IU]/mL (ref 0.350–4.500)

## 2021-08-17 LAB — COMPREHENSIVE METABOLIC PANEL
ALT: 18 U/L (ref 0–44)
AST: 21 U/L (ref 15–41)
Albumin: 4.3 g/dL (ref 3.5–5.0)
Alkaline Phosphatase: 56 U/L (ref 38–126)
Anion gap: 12 (ref 5–15)
BUN: 27 mg/dL — ABNORMAL HIGH (ref 8–23)
CO2: 24 mmol/L (ref 22–32)
Calcium: 9.6 mg/dL (ref 8.9–10.3)
Chloride: 101 mmol/L (ref 98–111)
Creatinine, Ser: 0.99 mg/dL (ref 0.61–1.24)
GFR, Estimated: >60 mL/min (ref >60)
Glucose, Bld: 102 mg/dL — ABNORMAL HIGH (ref 70–99)
Potassium: 4.3 mmol/L (ref 3.5–5.1)
Sodium: 137 mmol/L (ref 135–145)
Total Bilirubin: 0.9 mg/dL (ref 0.3–1.2)
Total Protein: 6.9 g/dL (ref 6.5–8.1)

## 2021-08-17 MED ORDER — AMIODARONE HCL 200 MG PO TABS: 200.0000 mg | ORAL_TABLET | Freq: Every day | ORAL | 3 refills | Status: DC

## 2021-08-17 MED ORDER — AMIODARONE HCL 200 MG PO TABS: ORAL_TABLET | ORAL | 0 refills | Status: DC

## 2021-08-17 NOTE — Patient Instructions (Signed)
Medication Instructions:  Your physician has recommended you make the following change in your medication:   START Amiodarone 200 mg twice a day for 2 weeks THEN go to 200 mg once daily  *If you need a refill on your cardiac medications before your next appointment, please call your pharmacy*   Lab Work: TSH, CMET, and CBC today at the [facility] entrance and check in at registrations.   If you have labs (blood work) drawn today and your tests are completely normal, you will receive your results only by: [place] (if you have MyChart) OR A paper copy in the mail If you have any lab test that is abnormal or we need to change your treatment, we will call you to review the results.   Testing/Procedures: None   Follow-Up: At [facility], you and your health needs are our priority.  As part of our continuing mission to provide you with exceptional heart care, we have created designated Provider Care Teams.  These Care Teams include your primary Cardiologist (physician) and Advanced Practice Providers (APPs -  Physician Assistants and Nurse Practitioners) who all work together to provide you with the care you need, when you need it.    Your next appointment:   1 month(s)  The format for your next appointment:   In Person  Provider:   [name], MD or [name], PA-C        Important Information About Sugar

## 2021-08-25 ENCOUNTER — Ambulatory Visit: Payer: Medicare HMO | Admitting: Dermatology

## 2021-08-25 DIAGNOSIS — L570 Actinic keratosis: Secondary | ICD-10-CM

## 2021-08-25 DIAGNOSIS — L821 Other seborrheic keratosis: Secondary | ICD-10-CM

## 2021-08-25 DIAGNOSIS — D692 Other nonthrombocytopenic purpura: Secondary | ICD-10-CM

## 2021-08-25 DIAGNOSIS — T148XXA Other injury of unspecified body region, initial encounter: Secondary | ICD-10-CM

## 2021-08-25 DIAGNOSIS — L578 Other skin changes due to chronic exposure to nonionizing radiation: Secondary | ICD-10-CM | POA: Diagnosis not present

## 2021-08-25 DIAGNOSIS — L719 Rosacea, unspecified: Secondary | ICD-10-CM | POA: Diagnosis not present

## 2021-08-25 DIAGNOSIS — L820 Inflamed seborrheic keratosis: Secondary | ICD-10-CM

## 2021-08-25 MED ORDER — PERMETHRIN 5 % EX CREA: 1.0000 "application " | TOPICAL_CREAM | Freq: Two times a day (BID) | CUTANEOUS | 11 refills | Status: AC

## 2021-08-25 MED ORDER — MUPIROCIN 2 % EX OINT: TOPICAL_OINTMENT | CUTANEOUS | 11 refills | Status: DC

## 2021-08-25 MED ORDER — METRONIDAZOLE 1 % EX GEL: CUTANEOUS | 11 refills | Status: AC

## 2021-08-25 NOTE — Progress Notes (Signed)
Follow-Up Visit   Subjective  Zachary Green is a 86 y.o. male who presents for the following: Skin Problem (The patient has spots, moles and lesions to be evaluated, some may be new or changing and the patient has concerns that these could be cancer. ).  The following portions of the chart were reviewed this encounter and updated as appropriate:   Tobacco  Allergies  Meds  Problems  Med Hx  Surg Hx  Fam Hx     Review of Systems:  No other skin or systemic complaints except as noted in HPI or Assessment and Plan.  Objective  Well appearing patient in no apparent distress; mood and affect are within normal limits.  A focused examination was performed including face,arms,scalp. Relevant physical exam findings are noted in the Assessment and Plan.  left side burn, scalp, right low back x 3 (3) Stuck-on, waxy, tan-brown papule --Discussed benign etiology and prognosis.   face,ears x 17 (17) Erythematous thin papules/macules with gritty scale.   face Mid face erythema with telangiectasias   body Clear skin   Assessment & Plan  Inflamed seborrheic keratosis (3) left side burn, scalp, right low back x 3  Symptomatic, irritating, patient would like treated.   Destruction of lesion - left side burn, scalp, right low back x 3 Complexity: simple   Destruction method: cryotherapy   Informed consent: discussed and consent obtained   Timeout:  patient name, date of birth, surgical site, and procedure verified Lesion destroyed using liquid nitrogen: Yes   Region frozen until ice ball extended beyond lesion: Yes   Outcome: patient tolerated procedure well with no complications   Post-procedure details: wound care instructions given    AK (actinic keratosis) (17) face,ears x 17  Actinic keratoses are precancerous spots that appear secondary to cumulative UV radiation exposure/sun exposure over time. They are chronic with expected duration over 1 year. A portion of actinic  keratoses will progress to squamous cell carcinoma of the skin. It is not possible to reliably predict which spots will progress to skin cancer and so treatment is recommended to prevent development of skin cancer.  Recommend daily broad spectrum sunscreen SPF 30+ to sun-exposed areas, reapply every 2 hours as needed.  Recommend staying in the shade or wearing long sleeves, sun glasses (UVA+UVB protection) and wide brim hats (4-inch brim around the entire circumference of the hat). Call for new or changing lesions.   Destruction of lesion - face,ears x 17 Complexity: simple   Destruction method: cryotherapy   Informed consent: discussed and consent obtained   Timeout:  patient name, date of birth, surgical site, and procedure verified Lesion destroyed using liquid nitrogen: Yes   Region frozen until ice ball extended beyond lesion: Yes   Outcome: patient tolerated procedure well with no complications   Post-procedure details: wound care instructions given    Rosacea face  Rosacea is a chronic progressive skin condition usually affecting the face of adults, causing redness and/or acne bumps. It is treatable but not curable. It sometimes affects the eyes (ocular rosacea) as well. It may respond to topical and/or systemic medication and can flare with stress, sun exposure, alcohol, exercise and some foods.  Daily application of broad spectrum spf 30+ sunscreen to face is recommended to reduce flares.   Continue Permetherin cream apply to face at night  Continue Metronidazole gel pump apply to face at nighty    Excoriation body  Hx of excoriations/scratches  Continue Mupirocin ointment apply to  affected skin qd-bid prn  Seborrheic Keratoses - Stuck-on, waxy, tan-brown papules and/or plaques  - Benign-appearing - Discussed benign etiology and prognosis. - Observe - Call for any changes  Actinic Damage - chronic, secondary to cumulative UV radiation exposure/sun exposure over  time - diffuse scaly erythematous macules with underlying dyspigmentation - Recommend daily broad spectrum sunscreen SPF 30+ to sun-exposed areas, reapply every 2 hours as needed.  - Recommend staying in the shade or wearing long sleeves, sun glasses (UVA+UVB protection) and wide brim hats (4-inch brim around the entire circumference of the hat). - Call for new or changing lesions.   Purpura - Chronic; persistent and recurrent.  Treatable, but not curable. - Violaceous macules and patches - Benign - Related to trauma, age, sun damage and/or use of blood thinners, chronic use of topical and/or oral steroids - Observe - Can use OTC arnica containing moisturizer such as Dermend Bruise Formula if desired - Call for worsening or other concerns   Return in about 8 months (around 04/27/2022) for Aks, ISK .  I, Marye Round, CMA, am acting as scribe for Sarina Ser, MD .  Documentation: I have reviewed the above documentation for accuracy and completeness, and I agree with the above.  Sarina Ser, MD

## 2021-08-25 NOTE — Patient Instructions (Addendum)

## 2021-08-29 ENCOUNTER — Other Ambulatory Visit: Payer: Self-pay | Admitting: Cardiology

## 2021-08-29 ENCOUNTER — Encounter: Payer: Self-pay | Admitting: Dermatology

## 2021-08-29 NOTE — Telephone Encounter (Signed)
Prescription refill request for Eliquis received. Indication:  Atrial Fib Last office visit: 08/17/21  R Dunn PA-C Scr: 0.99 on 08/17/21 Age:  86 Weight: 97.5kg  Based on above findings Eliquis '5mg'$  twice daily is the appropriate dose.  Refill approved.

## 2021-09-06 ENCOUNTER — Encounter (INDEPENDENT_AMBULATORY_CARE_PROVIDER_SITE_OTHER): Payer: Self-pay

## 2021-09-09 ENCOUNTER — Other Ambulatory Visit: Payer: Self-pay | Admitting: Physician Assistant

## 2021-09-15 ENCOUNTER — Ambulatory Visit (INDEPENDENT_AMBULATORY_CARE_PROVIDER_SITE_OTHER): Payer: Medicare HMO | Admitting: Ophthalmology

## 2021-09-15 ENCOUNTER — Encounter (INDEPENDENT_AMBULATORY_CARE_PROVIDER_SITE_OTHER): Payer: Self-pay | Admitting: Ophthalmology

## 2021-09-15 DIAGNOSIS — H348112 Central retinal vein occlusion, right eye, stable: Secondary | ICD-10-CM

## 2021-09-15 DIAGNOSIS — H353114 Nonexudative age-related macular degeneration, right eye, advanced atrophic with subfoveal involvement: Secondary | ICD-10-CM

## 2021-09-15 DIAGNOSIS — H3521 Other non-diabetic proliferative retinopathy, right eye: Secondary | ICD-10-CM | POA: Diagnosis not present

## 2021-09-15 DIAGNOSIS — H353121 Nonexudative age-related macular degeneration, left eye, early dry stage: Secondary | ICD-10-CM | POA: Insufficient documentation

## 2021-09-15 DIAGNOSIS — G453 Amaurosis fugax: Secondary | ICD-10-CM

## 2021-09-15 DIAGNOSIS — H35371 Puckering of macula, right eye: Secondary | ICD-10-CM

## 2021-09-15 NOTE — Assessment & Plan Note (Signed)
Minor no substantial impact on acuity

## 2021-09-15 NOTE — Assessment & Plan Note (Signed)
Zachary Green out of vision lasting several days in March 2023.  Patient is on Eliquis.  I explained to the patient that should this occur again he should proceed immediately to the emergency room and initiated stroke protocol and evaluation with the ophthalmologist on-call and/or the neuro stroke team.  On Eliquis currently.

## 2021-09-15 NOTE — Assessment & Plan Note (Signed)
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

## 2021-09-15 NOTE — Assessment & Plan Note (Signed)
Geographic atrophy centrally stable

## 2021-09-19 NOTE — H&P (View-Only) (Signed)
Cardiology Office Note    Date:  09/20/2021   ID:  Zachary Green, DOB July 06, 1935, MRN [id number]  PCP:  [name], MD  Cardiologist:  [name], MD  Electrophysiologist:  Vickie Epley, MD   Chief Complaint: Follow-up  History of Present Illness:   Zachary Green is a 86 y.o. male with history of permanent Afib on Eliquis s/p prior DCCV s/p repeat unsuccessful DCCV 06/07/2020 for recurrent Afib, aortic atherosclerosis, HFpEF/pulmonary hypertension, pulmonary nodules, BPH and prostate cancer s/p radiation therapy, HTN, HLD, prior tobacco use for 5 years in his 43s, and family history notable for Afib who presents for follow up A. fib.    Echo in 2017 showed a normal LVSF, moderate MR/TR, and moderate pulmonary hypertension. He was treated with low dose Lasix with stable symptoms noted in follow up. Nuclear stress test in 2017 was normal.    In follow up in 11/2019, he was maintaining sinus rhythm.   He was seen in 04/2020 with recurrent palpitations, dizziness, fatigue, and increased SOB that dated back to 03/2020. He was noted to be in Afib with ventricular rate of 100 bpm. He was on amiodarone 100 mg daily and was reloaded followed by increased dose of maintenance dose of 200 mg daily. His Toprol was also increased to 25 mg daily. Echo on 05/27/2020 showed an EF of 60-65%, no RWMA, indeterminate LV diastolic function parameters, normal RVSF and ventricular cavity size, mildly elevated PASP at 36.2 mmHg, moderately dilated left atrium at 4.6 cm, and mild mitral regurgitation. When he was seen in follow up in 05/2020, he remained in Afib with controlled ventricular response and continued to note exertional dyspnea.  He underwent briefly successful DCCV on 4/74/2595 without complication.  When he was seen in follow-up on 07/01/2020 he was noted to be in atrial flutter.  He noted persistent lightheadedness and fatigue which were largely unchanged when compared to his prior visits.  His  case was discussed with his primary cardiologist, and given he had failed amiodarone this was discontinued.  Toprol-XL was titrated to 37.'5mg'$  twice daily.  In an effort to prevent significant hypotension losartan was tapered to 50 mg.  He was referred to EP for further management, at the request of his cardiologist.  Upon establishing with EP in 07/2020, it was felt his underlying A. fib was not contributing to the majority of his symptoms with recommendation to pursue rate control.  Main complaint at that time was brain fog and headaches.  He was seen by EP in 10/2020 and continued to report fatigue and underlying brain fogginess.  It was unclear what was leading to this.  Metoprolol was decreased to 25 mg 2 times daily with recommendation for him to follow-up with his PCP.  Of note, MRI of the brain in 06/2020 showed no acute intracranial abnormality.  He was seen in the office on 04/29/2021, and noted a significant improvement in his functional status and quality of life following a steroid hip injection.  He was without symptoms of angina or decompensation.  He did continue to note some cognitive decline and "fogginess."  Toprol was decreased from 25 mg to 12.5 mg twice daily.  He was seen in the office on 06/09/2021 and overall felt similar to how he did at his last visit with stable exertional dyspnea and fatigue, without symptoms of angina or decompensation.  His main complaint at that time continued to be cognitive decline and mental "fogginess."  He did not notice  a significant change in the symptoms following the decreasing of metoprolol.  BP and ventricular rates remained well controlled.  We discontinued metoprolol altogether and underwent a trial of Cardizem CD 120 mg daily.  He was seen in the office on 07/11/2021 and continued to feel the same as he had at his prior visits without any significant change in the way he felt off beta-blocker.  He was tolerating Cardizem CD.  We underwent a trial of holding his  statin to see if this would improve his malaise, fatigue, and mental "fogginess."  He was last seen on 08/17/2021 and continued to feel the same, despite holding statin.  It was also noted he had ran out of losartan for a couple of days and also noted no change in his symptoms.  Despite close follow-up and extensive medication tapering/holidays he noted no change in symptoms.  Given this, it was felt further medication holidays/tapering would likely have a low yield and we elected to again pursue rhythm control strategy to see if this helps with his symptoms.  With this, he was initiated on amiodarone with plans for repeat DCCV.   He comes in accompanied by his son today continues to feel similar to how he has felt at his prior visits.  He did load amiodarone, though inadvertently discontinued this medication approximately 5 days ago.  No symptoms of angina or decompensation.  Blood pressure has been stable.  No falls or symptoms concerning for bleeding.  He has remained adherent to apixaban.  No syncope.   Labs independently reviewed: 08/2021 - potassium 4.8, BUN 28, serum creatinine 0.9, albumin 4.3, AST/ALT normal, Hgb 12.7, PLT 172, TC 169, TG 126, HDL 54, LDL 90, A1c 6.2 07/2021 - TSH normal 06/2020 - magnesium 1.9   Past Medical History:  Diagnosis Date   Actinic keratosis    Arthritis    Bladder neck contracture    BPH associated with nocturia    Chronic diastolic CHF (congestive heart failure) [facility]) cardiologist--- dr a. muhammad   a. echo 12/17: 55-60%, no RWMA, trivial AI, moderate MR with systolic bowing without prolapse, moderate TR, mild biatrial enlargement, PASP 50 mmHg   Diverticulosis of colon    DOE (dyspnea on exertion)    07-06-2020  per pt sob with chores around the house, with yard work, stairs, and long distances gets sob   ED (erectile dysfunction)    GERD (gastroesophageal reflux disease)    History of basal cell carcinoma (BCC) excision    excision's ;   09/ 2015 left  upper lateral chest;  09/ 2016  right lateral top shoulder;  10/ 2017 right anterior clavical area    History of diverticulitis of colon    History of gastric polyp    followed by dr Jerilynn Mages. [place]lupe Spanish (gi)---  previous adenomatous low grade   History of urinary retention    Hyperlipidemia    Hypertension    followed by pcp and cardiology   Light-headed feeling    07-06-2020  per pt when he goes in and out of afib get light headed   Macular degeneration, right eye    Multiple pulmonary nodules    followed by pcp   Persistent atrial fibrillation [facility]) followed by cardiology--- dr a. Rogue Jury -- first dx 12/ 2017   a. on Eliquis, amiodarone, Toprol; b. s/p successful dccv 03/28/16  and 06-07-2020; c. CHADS2VASc at least 3 (HTN, age x 2)   Personal history of colonic adenoma 05/23/2007   04/2007 - 6  mm adenoma   Prostate cancer [facility]) urologist--- dr Diona Fanti   dx 2017 ,  post HIFU ablative therapy 07/ 2017 and completed external radiation 04/ 2019   Rosacea    followed by dr d. Nehemiah Massed (dermotology)   Schatzki's ring    grade 1 varices   Wears dentures    upper   Wears glasses     Past Surgical History:  Procedure Laterality Date   CARDIOVERSION N/A 06/07/2020   Procedure: CARDIOVERSION;  Surgeon: Wellington Hampshire, MD;  Location: [facility] ORS;  Service: Cardiovascular;  Laterality: N/A;   CATARACT EXTRACTION W/ INTRAOCULAR LENS  IMPLANT, BILATERAL  2010 ;  2011   COLONOSCOPY  last one 2014  '@[facility]'$    ELECTROPHYSIOLOGIC STUDY N/A 03/28/2016   Procedure: CARDIOVERSION;  Surgeon: Minna Merritts, MD;  Location: [facility] ORS;  Service: Cardiovascular;  Laterality: N/A;   ESOPHAGOGASTRODUODENOSCOPY  last one 03-04-2018  '@[facility]'$    ESOPHAGOGASTRODUODENOSCOPY (EGD) WITH PROPOFOL N/A 08/18/2016   Procedure: ESOPHAGOGASTRODUODENOSCOPY (EGD) WITH PROPOFOL;  Surgeon: Lollie Sails, MD;  Location: [facility] ENDOSCOPY;  Service: Endoscopy;  Laterality: N/A;   GREEN LIGHT LASER TURP (TRANSURETHRAL RESECTION OF PROSTATE  N/A 08/17/2015   Procedure: GREEN LIGHT LASER TURP (TRANSURETHRAL RESECTION OF PROSTATE;  Surgeon: Royston Cowper, MD;  Location: [facility] ORS;  Service: Urology;  Laterality: N/A;   NM MYOVIEW LTD     PATELLA FRACTURE SURGERY  1984   pin placed   TONSILLECTOMY  1949   TRANSURETHRAL RESECTION OF BLADDER NECK N/A 07/12/2020   Procedure: TRANSURETHRAL RESECTION OF BLADDER NECK;  Surgeon: Franchot Gallo, MD;  Location: [facility];  Service: Urology;  Laterality: N/A;   UPPER ESOPHAGEAL ENDOSCOPIC ULTRASOUND (EUS) N/A 09/21/2016   Procedure: UPPER ESOPHAGEAL ENDOSCOPIC ULTRASOUND (EUS);  Surgeon: Reita Cliche, MD;  Location: [facility] ENDOSCOPY;  Service: Gastroenterology;  Laterality: N/A;    Current Medications: Current Meds  Medication Sig   amiodarone (PACERONE) 200 MG tablet Take 1 tablet (200 mg total) by mouth daily.   Diclofenac Sodium 3 % GEL Apply 1 application. topically as needed (pain).   diltiazem (CARDIZEM CD) 120 MG 24 hr capsule Take 1 capsule (120 mg total) by mouth daily.   docusate sodium (COLACE) 100 MG capsule Take 100 mg by mouth 2 (two) times daily as needed (constipation).   doxycycline (VIBRAMYCIN) 50 MG capsule 1 CAPSULE BY MOUTH DAILY TAKE WITH FOOD AND PLENTY OF FLUID   ELIQUIS 5 MG TABS tablet TAKE 1 TABLET BY MOUTH TWICE A DAY   esomeprazole (NEXIUM) 20 MG capsule Take 20 mg by mouth daily before breakfast.   furosemide (LASIX) 20 MG tablet TAKE 1 TABLET BY MOUTH EVERY DAY   losartan (COZAAR) 50 MG tablet TAKE 1 TABLET BY MOUTH EVERY DAY   metroNIDAZOLE (METROGEL) 1 % gel Apply to face at night   Multiple Vitamins-Minerals (PRESERVISION AREDS PO) Take 1 tablet by mouth in the morning and at bedtime.   Multiple Vitamins-Minerals (PRESERVISION AREDS PO) Take 1 capsule by mouth in the morning and at bedtime.   mupirocin ointment (BACTROBAN) 2 % Apply 1 application. topically as needed (rash).   Omega-3 Fatty Acids (FISH OIL) 1000 MG CAPS Take 1,000 mg  by mouth in the morning and at bedtime.   permethrin (ELIMITE) 5 % cream Apply 1 application. topically 2 (two) times daily. Applied to cheeks   Polyethyl Glycol-Propyl Glycol (SYSTANE OP) Place 1 drop into both eyes 2 (two) times daily as needed (scheduled in the morning &  in the evening if needed for dry/irritated eyes.).   pravastatin (PRAVACHOL) 40 MG tablet TAKE ONE TABLET BY MOUTH EVERY DAY   tamsulosin (FLOMAX) 0.4 MG CAPS capsule Take 1 capsule (0.4 mg total) by mouth daily after supper.   vitamin E 180 MG (400 UNITS) capsule Take 400 Units by mouth in the morning.    Allergies:   Codeine   Social History   Socioeconomic History   Marital status: Married    Spouse name: Katharine Look   Number of children: 4   Years of education: Not on file   Highest education level: Not on file  Occupational History   Occupation: retired Teacher, music: retired  Tobacco Use   Smoking status: Former    Packs/day: 1.00    Years: 3.00    Total pack years: 3.00    Types: Cigarettes    Quit date: 03/28/1959    Years since quitting: 62.5   Smokeless tobacco: Former    Types: Chew    Quit date: 07/06/1968  Vaping Use   Vaping Use: Never used  Substance and Sexual Activity   Alcohol use: No   Drug use: Never   Sexual activity: Not on file  Other Topics Concern   Not on file  Social History Narrative   Has living will   Wife is [place] health care POA (then oldest son, Sherren Mocha)   Not sure about DNR---will accept resuscitation for now   Probably would not want a feeding tube   Social Determinants of Health   Financial Resource Strain: Not on file  Food Insecurity: Not on file  Transportation Needs: Not on file  Physical Activity: Not on file  Stress: Not on file  Social Connections: Not on file     Family History:  The patient's family history includes Alzheimer's disease in his mother; Cancer in his mother; Diabetes in his brother; Heart disease in his brother; Lung cancer in his  brother.  ROS:   12-point review of systems is negative unless otherwise noted in the HPI.   EKGs/Labs/Other Studies Reviewed:    Studies reviewed were summarized above. The additional studies were reviewed today:  Lexiscan MPI 07/19/2020: There was no ST segment deviation noted during stress. The study is normal. The left ventricular ejection fraction is normal (55-65%). This is a low risk study. CT attenuation images showed mild coronary and aortic calcifications. __________   2D echo 05/27/2020: 1. Left ventricular ejection fraction, by estimation, is 60 to 65%. The  left ventricle has normal function. The left ventricle has no regional  wall motion abnormalities. There is moderate left ventricular hypertrophy.  Left ventricular diastolic  parameters are indeterminate.   2. Right ventricular systolic function is normal. The right ventricular  size is normal. There is mildly elevated pulmonary artery systolic  pressure.   3. Left atrial size was moderately dilated.   4. The mitral valve is normal in structure. Mild mitral valve  Regurgitation. __________   Carlton Adam MPI 03/16/2016: There was no ST segment deviation noted during stress. The study is normal. This is a low risk study. The left ventricular ejection fraction is normal (55-65%). __________   2D echo 03/09/2016: - Left ventricle: The cavity size was normal. There was moderate    concentric hypertrophy. Systolic function was normal. The    estimated ejection fraction was in the range of 55% to 60%. Wall    motion was normal; there were no regional wall motion  abnormalities.  - Aortic valve: There was trivial regurgitation.  - Mitral valve: Systolic bowing without prolapse. There was    moderate regurgitation.  - Left atrium: The atrium was mildly dilated.  - Right atrium: The atrium was mildly dilated.  - Tricuspid valve: There was moderate regurgitation.  - Pulmonary arteries: Systolic pressure was  moderately increased.    PA peak pressure: 50 mm Hg (S).   EKG:  EKG is ordered today.  The EKG ordered today demonstrates A-fib, 79 bpm, nonspecific ST-T changes  Recent Labs: 08/17/2021: ALT 18; BUN 27; Creatinine, Ser 0.99; Hemoglobin 12.9; Platelets 172; Potassium 4.3; Sodium 137; TSH 2.501  Recent Lipid Panel    Component Value Date/Time   CHOL 181 11/22/2010 1012   TRIG 82.0 11/22/2010 1012   HDL 54.40 11/22/2010 1012   CHOLHDL 3 11/22/2010 1012   VLDL 16.4 11/22/2010 1012   LDLCALC 110 (H) 11/22/2010 1012   LDLDIRECT 126.2 05/01/2007 0954    PHYSICAL EXAM:    VS:  BP 130/82 (BP Location: Left Arm, Patient Position: Sitting, Cuff Size: Large)   Pulse 79   Ht '5\' 7"'$  (1.702 m)   Wt 215 lb (97.5 kg)   SpO2 97%   BMI 33.67 kg/m   BMI: Body mass index is 33.67 kg/m.  Physical Exam Vitals reviewed.  Constitutional:      Appearance: He is well-developed.  HENT:     Head: Normocephalic and atraumatic.  Eyes:     General:        Right eye: No discharge.        Left eye: No discharge.  Neck:     Vascular: No JVD.  Cardiovascular:     Rate and Rhythm: Normal rate. Rhythm irregularly irregular.     Heart sounds: Normal heart sounds, S1 normal and S2 normal. Heart sounds not distant. No midsystolic click and no opening snap. No murmur heard.    No friction rub.  Pulmonary:     Effort: Pulmonary effort is normal. No respiratory distress.     Breath sounds: Normal breath sounds. No decreased breath sounds, wheezing or rales.  Chest:     Chest wall: No tenderness.  Abdominal:     General: There is no distension.     Palpations: Abdomen is soft.  Musculoskeletal:     Cervical back: Normal range of motion.  Skin:    General: Skin is warm and dry.     Nails: There is no clubbing.  Neurological:     Mental Status: He is alert and oriented to person, place, and time.  Psychiatric:        Speech: Speech normal.        Behavior: Behavior normal.        Thought Content:  Thought content normal.        Judgment: Judgment normal.     Wt Readings from Last 3 Encounters:  09/20/21 215 lb (97.5 kg)  08/17/21 215 lb (97.5 kg)  07/11/21 215 lb 12.8 oz (97.9 kg)     ASSESSMENT & PLAN:   Longstanding persistent A-fib: He remains in A-fib with controlled ventricular response.  We will have him restart amiodarone 200 mg daily with plans for a repeat DCCV in approximately 2 weeks to see if this improves his generalized malaise/fatigue/lightheadedness.  I do not think this will significantly improve his "mental fogginess."  He will continue current dose diltiazem and apixaban.  CHA2DS2-VASc at least 5.  We have avoided beta-blockers secondary to underlying  generalized malaise and fatigue.  HFpEF/pulmonary hypertension: He appears euvolemic and well compensated.  Recent echo demonstrated preserved LV systolic function with stable mild mitral regurgitation and improved PASP.  He remains on low-dose furosemide.  Recent renal function electrolytes stable.  HTN: Blood pressure is reasonably controlled in the office today.  He remains on Cardizem CD and losartan.  HLD: LDL 81 in 02/2021.  No change in symptoms with statin holiday.  He is back on pravastatin.  Generalized malaise/fatigue and mental "fogginess": Uncertain etiology to his underlying symptoms.  We have undergone tapering and ultimate discontinuation of beta-blocker along with statin holiday and discontinuation of amiodarone without significant improvement in symptoms.  Pursue rhythm control from A-fib perspective as outlined above.  Recent Lexiscan MPI low risk making coronary insufficiency less likely.  If symptoms persist despite restoration of sinus rhythm, it would likely be beneficial for him to follow-up with his PCP.   Shared Decision Making/Informed Consent{  The risks (stroke, cardiac arrhythmias rarely resulting in the need for a temporary or permanent pacemaker, skin irritation or burns and  complications associated with conscious sedation including aspiration, arrhythmia, respiratory failure and death), benefits (restoration of normal sinus rhythm) and alternatives of a direct current cardioversion were explained in detail to Mr. Querry and he agrees to proceed.       Disposition: F/u with Dr. Fletcher Anon or an APP in 1 month.   Medication Adjustments/Labs and Tests Ordered: Current medicines are reviewed at length with the patient today.  Concerns regarding medicines are outlined above. Medication changes, Labs and Tests ordered today are summarized above and listed in the Patient Instructions accessible in Encounters.   Signed, [name], PA-C 09/20/2021 4:26 PM     [facility] [address] [place] [address] [place], [place] [address] [phone]

## 2021-09-19 NOTE — Progress Notes (Signed)
Cardiology Office Note    Date:  09/20/2021   ID:  Zachary Green, DOB July 06, 1935, MRN [id number]  PCP:  [name], MD  Cardiologist:  [name], MD  Electrophysiologist:  Vickie Epley, MD   Chief Complaint: Follow-up  History of Present Illness:   Zachary Green is a 86 y.o. male with history of permanent Afib on Eliquis s/p prior DCCV s/p repeat unsuccessful DCCV 06/07/2020 for recurrent Afib, aortic atherosclerosis, HFpEF/pulmonary hypertension, pulmonary nodules, BPH and prostate cancer s/p radiation therapy, HTN, HLD, prior tobacco use for 5 years in his 43s, and family history notable for Afib who presents for follow up A. fib.    Echo in 2017 showed a normal LVSF, moderate MR/TR, and moderate pulmonary hypertension. He was treated with low dose Lasix with stable symptoms noted in follow up. Nuclear stress test in 2017 was normal.    In follow up in 11/2019, he was maintaining sinus rhythm.   He was seen in 04/2020 with recurrent palpitations, dizziness, fatigue, and increased SOB that dated back to 03/2020. He was noted to be in Afib with ventricular rate of 100 bpm. He was on amiodarone 100 mg daily and was reloaded followed by increased dose of maintenance dose of 200 mg daily. His Toprol was also increased to 25 mg daily. Echo on 05/27/2020 showed an EF of 60-65%, no RWMA, indeterminate LV diastolic function parameters, normal RVSF and ventricular cavity size, mildly elevated PASP at 36.2 mmHg, moderately dilated left atrium at 4.6 cm, and mild mitral regurgitation. When he was seen in follow up in 05/2020, he remained in Afib with controlled ventricular response and continued to note exertional dyspnea.  He underwent briefly successful DCCV on 4/74/2595 without complication.  When he was seen in follow-up on 07/01/2020 he was noted to be in atrial flutter.  He noted persistent lightheadedness and fatigue which were largely unchanged when compared to his prior visits.  His  case was discussed with his primary cardiologist, and given he had failed amiodarone this was discontinued.  Toprol-XL was titrated to 37.'5mg'$  twice daily.  In an effort to prevent significant hypotension losartan was tapered to 50 mg.  He was referred to EP for further management, at the request of his cardiologist.  Upon establishing with EP in 07/2020, it was felt his underlying A. fib was not contributing to the majority of his symptoms with recommendation to pursue rate control.  Main complaint at that time was brain fog and headaches.  He was seen by EP in 10/2020 and continued to report fatigue and underlying brain fogginess.  It was unclear what was leading to this.  Metoprolol was decreased to 25 mg 2 times daily with recommendation for him to follow-up with his PCP.  Of note, MRI of the brain in 06/2020 showed no acute intracranial abnormality.  He was seen in the office on 04/29/2021, and noted a significant improvement in his functional status and quality of life following a steroid hip injection.  He was without symptoms of angina or decompensation.  He did continue to note some cognitive decline and "fogginess."  Toprol was decreased from 25 mg to 12.5 mg twice daily.  He was seen in the office on 06/09/2021 and overall felt similar to how he did at his last visit with stable exertional dyspnea and fatigue, without symptoms of angina or decompensation.  His main complaint at that time continued to be cognitive decline and mental "fogginess."  He did not notice  a significant change in the symptoms following the decreasing of metoprolol.  BP and ventricular rates remained well controlled.  We discontinued metoprolol altogether and underwent a trial of Cardizem CD 120 mg daily.  He was seen in the office on 07/11/2021 and continued to feel the same as he had at his prior visits without any significant change in the way he felt off beta-blocker.  He was tolerating Cardizem CD.  We underwent a trial of holding his  statin to see if this would improve his malaise, fatigue, and mental "fogginess."  He was last seen on 08/17/2021 and continued to feel the same, despite holding statin.  It was also noted he had ran out of losartan for a couple of days and also noted no change in his symptoms.  Despite close follow-up and extensive medication tapering/holidays he noted no change in symptoms.  Given this, it was felt further medication holidays/tapering would likely have a low yield and we elected to again pursue rhythm control strategy to see if this helps with his symptoms.  With this, he was initiated on amiodarone with plans for repeat DCCV.   He comes in accompanied by his son today continues to feel similar to how he has felt at his prior visits.  He did load amiodarone, though inadvertently discontinued this medication approximately 5 days ago.  No symptoms of angina or decompensation.  Blood pressure has been stable.  No falls or symptoms concerning for bleeding.  He has remained adherent to apixaban.  No syncope.   Labs independently reviewed: 08/2021 - potassium 4.8, BUN 28, serum creatinine 0.9, albumin 4.3, AST/ALT normal, Hgb 12.7, PLT 172, TC 169, TG 126, HDL 54, LDL 90, A1c 6.2 07/2021 - TSH normal 06/2020 - magnesium 1.9   Past Medical History:  Diagnosis Date   Actinic keratosis    Arthritis    Bladder neck contracture    BPH associated with nocturia    Chronic diastolic CHF (congestive heart failure) [facility]) cardiologist--- dr a. muhammad   a. echo 12/17: 55-60%, no RWMA, trivial AI, moderate MR with systolic bowing without prolapse, moderate TR, mild biatrial enlargement, PASP 50 mmHg   Diverticulosis of colon    DOE (dyspnea on exertion)    07-06-2020  per pt sob with chores around the house, with yard work, stairs, and long distances gets sob   ED (erectile dysfunction)    GERD (gastroesophageal reflux disease)    History of basal cell carcinoma (BCC) excision    excision's ;   09/ 2015 left  upper lateral chest;  09/ 2016  right lateral top shoulder;  10/ 2017 right anterior clavical area    History of diverticulitis of colon    History of gastric polyp    followed by dr Jerilynn Mages. [place]pe Spanish (gi)---  previous adenomatous low grade   History of urinary retention    Hyperlipidemia    Hypertension    followed by pcp and cardiology   Light-headed feeling    07-06-2020  per pt when he goes in and out of afib get light headed   Macular degeneration, right eye    Multiple pulmonary nodules    followed by pcp   Persistent atrial fibrillation [facility]) followed by cardiology--- dr a. Rogue Jury -- first dx 12/ 2017   a. on Eliquis, amiodarone, Toprol; b. s/p successful dccv 03/28/16  and 06-07-2020; c. CHADS2VASc at least 3 (HTN, age x 2)   Personal history of colonic adenoma 05/23/2007   04/2007 - 6  mm adenoma   Prostate cancer [facility]) urologist--- dr Diona Fanti   dx 2017 ,  post HIFU ablative therapy 07/ 2017 and completed external radiation 04/ 2019   Rosacea    followed by dr d. Nehemiah Massed (dermotology)   Schatzki's ring    grade 1 varices   Wears dentures    upper   Wears glasses     Past Surgical History:  Procedure Laterality Date   CARDIOVERSION N/A 06/07/2020   Procedure: CARDIOVERSION;  Surgeon: Wellington Hampshire, MD;  Location: [facility] ORS;  Service: Cardiovascular;  Laterality: N/A;   CATARACT EXTRACTION W/ INTRAOCULAR LENS  IMPLANT, BILATERAL  2010 ;  2011   COLONOSCOPY  last one 2014  '@[facility]'$    ELECTROPHYSIOLOGIC STUDY N/A 03/28/2016   Procedure: CARDIOVERSION;  Surgeon: Minna Merritts, MD;  Location: [facility] ORS;  Service: Cardiovascular;  Laterality: N/A;   ESOPHAGOGASTRODUODENOSCOPY  last one 03-04-2018  '@[facility]'$    ESOPHAGOGASTRODUODENOSCOPY (EGD) WITH PROPOFOL N/A 08/18/2016   Procedure: ESOPHAGOGASTRODUODENOSCOPY (EGD) WITH PROPOFOL;  Surgeon: Lollie Sails, MD;  Location: [facility] ENDOSCOPY;  Service: Endoscopy;  Laterality: N/A;   GREEN LIGHT LASER TURP (TRANSURETHRAL RESECTION OF PROSTATE  N/A 08/17/2015   Procedure: GREEN LIGHT LASER TURP (TRANSURETHRAL RESECTION OF PROSTATE;  Surgeon: Royston Cowper, MD;  Location: [facility] ORS;  Service: Urology;  Laterality: N/A;   NM MYOVIEW LTD     PATELLA FRACTURE SURGERY  1984   pin placed   TONSILLECTOMY  1949   TRANSURETHRAL RESECTION OF BLADDER NECK N/A 07/12/2020   Procedure: TRANSURETHRAL RESECTION OF BLADDER NECK;  Surgeon: Franchot Gallo, MD;  Location: [facility];  Service: Urology;  Laterality: N/A;   UPPER ESOPHAGEAL ENDOSCOPIC ULTRASOUND (EUS) N/A 09/21/2016   Procedure: UPPER ESOPHAGEAL ENDOSCOPIC ULTRASOUND (EUS);  Surgeon: Reita Cliche, MD;  Location: [facility] ENDOSCOPY;  Service: Gastroenterology;  Laterality: N/A;    Current Medications: Current Meds  Medication Sig   amiodarone (PACERONE) 200 MG tablet Take 1 tablet (200 mg total) by mouth daily.   Diclofenac Sodium 3 % GEL Apply 1 application. topically as needed (pain).   diltiazem (CARDIZEM CD) 120 MG 24 hr capsule Take 1 capsule (120 mg total) by mouth daily.   docusate sodium (COLACE) 100 MG capsule Take 100 mg by mouth 2 (two) times daily as needed (constipation).   doxycycline (VIBRAMYCIN) 50 MG capsule 1 CAPSULE BY MOUTH DAILY TAKE WITH FOOD AND PLENTY OF FLUID   ELIQUIS 5 MG TABS tablet TAKE 1 TABLET BY MOUTH TWICE A DAY   esomeprazole (NEXIUM) 20 MG capsule Take 20 mg by mouth daily before breakfast.   furosemide (LASIX) 20 MG tablet TAKE 1 TABLET BY MOUTH EVERY DAY   losartan (COZAAR) 50 MG tablet TAKE 1 TABLET BY MOUTH EVERY DAY   metroNIDAZOLE (METROGEL) 1 % gel Apply to face at night   Multiple Vitamins-Minerals (PRESERVISION AREDS PO) Take 1 tablet by mouth in the morning and at bedtime.   Multiple Vitamins-Minerals (PRESERVISION AREDS PO) Take 1 capsule by mouth in the morning and at bedtime.   mupirocin ointment (BACTROBAN) 2 % Apply 1 application. topically as needed (rash).   Omega-3 Fatty Acids (FISH OIL) 1000 MG CAPS Take 1,000 mg  by mouth in the morning and at bedtime.   permethrin (ELIMITE) 5 % cream Apply 1 application. topically 2 (two) times daily. Applied to cheeks   Polyethyl Glycol-Propyl Glycol (SYSTANE OP) Place 1 drop into both eyes 2 (two) times daily as needed (scheduled in the morning &  in the evening if needed for dry/irritated eyes.).   pravastatin (PRAVACHOL) 40 MG tablet TAKE ONE TABLET BY MOUTH EVERY DAY   tamsulosin (FLOMAX) 0.4 MG CAPS capsule Take 1 capsule (0.4 mg total) by mouth daily after supper.   vitamin E 180 MG (400 UNITS) capsule Take 400 Units by mouth in the morning.    Allergies:   Codeine   Social History   Socioeconomic History   Marital status: Married    Spouse name: Katharine Look   Number of children: 4   Years of education: Not on file   Highest education level: Not on file  Occupational History   Occupation: retired Teacher, music: retired  Tobacco Use   Smoking status: Former    Packs/day: 1.00    Years: 3.00    Total pack years: 3.00    Types: Cigarettes    Quit date: 03/28/1959    Years since quitting: 62.5   Smokeless tobacco: Former    Types: Chew    Quit date: 07/06/1968  Vaping Use   Vaping Use: Never used  Substance and Sexual Activity   Alcohol use: No   Drug use: Never   Sexual activity: Not on file  Other Topics Concern   Not on file  Social History Narrative   Has living will   Wife is [place] health care POA (then oldest son, Sherren Mocha)   Not sure about DNR---will accept resuscitation for now   Probably would not want a feeding tube   Social Determinants of Health   Financial Resource Strain: Not on file  Food Insecurity: Not on file  Transportation Needs: Not on file  Physical Activity: Not on file  Stress: Not on file  Social Connections: Not on file     Family History:  The patient's family history includes Alzheimer's disease in his mother; Cancer in his mother; Diabetes in his brother; Heart disease in his brother; Lung cancer in his  brother.  ROS:   12-point review of systems is negative unless otherwise noted in the HPI.   EKGs/Labs/Other Studies Reviewed:    Studies reviewed were summarized above. The additional studies were reviewed today:  Lexiscan MPI 07/19/2020: There was no ST segment deviation noted during stress. The study is normal. The left ventricular ejection fraction is normal (55-65%). This is a low risk study. CT attenuation images showed mild coronary and aortic calcifications. __________   2D echo 05/27/2020: 1. Left ventricular ejection fraction, by estimation, is 60 to 65%. The  left ventricle has normal function. The left ventricle has no regional  wall motion abnormalities. There is moderate left ventricular hypertrophy.  Left ventricular diastolic  parameters are indeterminate.   2. Right ventricular systolic function is normal. The right ventricular  size is normal. There is mildly elevated pulmonary artery systolic  pressure.   3. Left atrial size was moderately dilated.   4. The mitral valve is normal in structure. Mild mitral valve  Regurgitation. __________   Carlton Adam MPI 03/16/2016: There was no ST segment deviation noted during stress. The study is normal. This is a low risk study. The left ventricular ejection fraction is normal (55-65%). __________   2D echo 03/09/2016: - Left ventricle: The cavity size was normal. There was moderate    concentric hypertrophy. Systolic function was normal. The    estimated ejection fraction was in the range of 55% to 60%. Wall    motion was normal; there were no regional wall motion  abnormalities.  - Aortic valve: There was trivial regurgitation.  - Mitral valve: Systolic bowing without prolapse. There was    moderate regurgitation.  - Left atrium: The atrium was mildly dilated.  - Right atrium: The atrium was mildly dilated.  - Tricuspid valve: There was moderate regurgitation.  - Pulmonary arteries: Systolic pressure was  moderately increased.    PA peak pressure: 50 mm Hg (S).   EKG:  EKG is ordered today.  The EKG ordered today demonstrates A-fib, 79 bpm, nonspecific ST-T changes  Recent Labs: 08/17/2021: ALT 18; BUN 27; Creatinine, Ser 0.99; Hemoglobin 12.9; Platelets 172; Potassium 4.3; Sodium 137; TSH 2.501  Recent Lipid Panel    Component Value Date/Time   CHOL 181 11/22/2010 1012   TRIG 82.0 11/22/2010 1012   HDL 54.40 11/22/2010 1012   CHOLHDL 3 11/22/2010 1012   VLDL 16.4 11/22/2010 1012   LDLCALC 110 (H) 11/22/2010 1012   LDLDIRECT 126.2 05/01/2007 0954    PHYSICAL EXAM:    VS:  BP 130/82 (BP Location: Left Arm, Patient Position: Sitting, Cuff Size: Large)   Pulse 79   Ht '5\' 7"'$  (1.702 m)   Wt 215 lb (97.5 kg)   SpO2 97%   BMI 33.67 kg/m   BMI: Body mass index is 33.67 kg/m.  Physical Exam Vitals reviewed.  Constitutional:      Appearance: He is well-developed.  HENT:     Head: Normocephalic and atraumatic.  Eyes:     General:        Right eye: No discharge.        Left eye: No discharge.  Neck:     Vascular: No JVD.  Cardiovascular:     Rate and Rhythm: Normal rate. Rhythm irregularly irregular.     Heart sounds: Normal heart sounds, S1 normal and S2 normal. Heart sounds not distant. No midsystolic click and no opening snap. No murmur heard.    No friction rub.  Pulmonary:     Effort: Pulmonary effort is normal. No respiratory distress.     Breath sounds: Normal breath sounds. No decreased breath sounds, wheezing or rales.  Chest:     Chest wall: No tenderness.  Abdominal:     General: There is no distension.     Palpations: Abdomen is soft.  Musculoskeletal:     Cervical back: Normal range of motion.  Skin:    General: Skin is warm and dry.     Nails: There is no clubbing.  Neurological:     Mental Status: He is alert and oriented to person, place, and time.  Psychiatric:        Speech: Speech normal.        Behavior: Behavior normal.        Thought Content:  Thought content normal.        Judgment: Judgment normal.     Wt Readings from Last 3 Encounters:  09/20/21 215 lb (97.5 kg)  08/17/21 215 lb (97.5 kg)  07/11/21 215 lb 12.8 oz (97.9 kg)     ASSESSMENT & PLAN:   Longstanding persistent A-fib: He remains in A-fib with controlled ventricular response.  We will have him restart amiodarone 200 mg daily with plans for a repeat DCCV in approximately 2 weeks to see if this improves his generalized malaise/fatigue/lightheadedness.  I do not think this will significantly improve his "mental fogginess."  He will continue current dose diltiazem and apixaban.  CHA2DS2-VASc at least 5.  We have avoided beta-blockers secondary to underlying  generalized malaise and fatigue.  HFpEF/pulmonary hypertension: He appears euvolemic and well compensated.  Recent echo demonstrated preserved LV systolic function with stable mild mitral regurgitation and improved PASP.  He remains on low-dose furosemide.  Recent renal function electrolytes stable.  HTN: Blood pressure is reasonably controlled in the office today.  He remains on Cardizem CD and losartan.  HLD: LDL 81 in 02/2021.  No change in symptoms with statin holiday.  He is back on pravastatin.  Generalized malaise/fatigue and mental "fogginess": Uncertain etiology to his underlying symptoms.  We have undergone tapering and ultimate discontinuation of beta-blocker along with statin holiday and discontinuation of amiodarone without significant improvement in symptoms.  Pursue rhythm control from A-fib perspective as outlined above.  Recent Lexiscan MPI low risk making coronary insufficiency less likely.  If symptoms persist despite restoration of sinus rhythm, it would likely be beneficial for him to follow-up with his PCP.   Shared Decision Making/Informed Consent{  The risks (stroke, cardiac arrhythmias rarely resulting in the need for a temporary or permanent pacemaker, skin irritation or burns and  complications associated with conscious sedation including aspiration, arrhythmia, respiratory failure and death), benefits (restoration of normal sinus rhythm) and alternatives of a direct current cardioversion were explained in detail to Mr. Querry and he agrees to proceed.       Disposition: F/u with Dr. Fletcher Anon or an APP in 1 month.   Medication Adjustments/Labs and Tests Ordered: Current medicines are reviewed at length with the patient today.  Concerns regarding medicines are outlined above. Medication changes, Labs and Tests ordered today are summarized above and listed in the Patient Instructions accessible in Encounters.   Signed, [name], PA-C 09/20/2021 4:26 PM     [facility] [address] [place] [place] [address] [place], [place] [address] [phone]

## 2021-09-20 ENCOUNTER — Encounter: Payer: Self-pay | Admitting: Physician Assistant

## 2021-09-20 ENCOUNTER — Ambulatory Visit: Payer: Medicare HMO | Admitting: Physician Assistant

## 2021-09-20 ENCOUNTER — Telehealth: Payer: Self-pay | Admitting: Physician Assistant

## 2021-09-20 VITALS — BP 130/82 | HR 79 | Ht 67.0 in | Wt 215.0 lb

## 2021-09-20 DIAGNOSIS — I10 Essential (primary) hypertension: Secondary | ICD-10-CM

## 2021-09-20 DIAGNOSIS — I4819 Other persistent atrial fibrillation: Secondary | ICD-10-CM

## 2021-09-20 DIAGNOSIS — I4811 Longstanding persistent atrial fibrillation: Secondary | ICD-10-CM | POA: Diagnosis not present

## 2021-09-20 DIAGNOSIS — I5032 Chronic diastolic (congestive) heart failure: Secondary | ICD-10-CM

## 2021-09-20 DIAGNOSIS — I272 Pulmonary hypertension, unspecified: Secondary | ICD-10-CM

## 2021-09-20 DIAGNOSIS — R5383 Other fatigue: Secondary | ICD-10-CM

## 2021-09-20 DIAGNOSIS — E785 Hyperlipidemia, unspecified: Secondary | ICD-10-CM

## 2021-09-20 DIAGNOSIS — R5381 Other malaise: Secondary | ICD-10-CM

## 2021-09-20 DIAGNOSIS — I4821 Permanent atrial fibrillation: Secondary | ICD-10-CM

## 2021-09-20 MED ORDER — AMIODARONE HCL 200 MG PO TABS: 200.0000 mg | ORAL_TABLET | Freq: Every day | ORAL | 3 refills | Status: DC

## 2021-09-21 ENCOUNTER — Other Ambulatory Visit
Admission: RE | Admit: 2021-09-21 | Discharge: 2021-09-21 | Disposition: A | Discharge status: Home / Self Care | Payer: Medicare HMO | Attending: Physician Assistant | Admitting: Physician Assistant

## 2021-09-21 DIAGNOSIS — I5032 Chronic diastolic (congestive) heart failure: Secondary | ICD-10-CM | POA: Diagnosis present

## 2021-09-21 DIAGNOSIS — I4821 Permanent atrial fibrillation: Secondary | ICD-10-CM | POA: Diagnosis present

## 2021-09-21 DIAGNOSIS — I4811 Longstanding persistent atrial fibrillation: Secondary | ICD-10-CM | POA: Diagnosis present

## 2021-09-21 DIAGNOSIS — I4819 Other persistent atrial fibrillation: Secondary | ICD-10-CM | POA: Insufficient documentation

## 2021-09-21 LAB — CBC
HCT: 37.2 % — ABNORMAL LOW (ref 39.0–52.0)
Hemoglobin: 12.1 g/dL — ABNORMAL LOW (ref 13.0–17.0)
MCH: 28.5 pg (ref 26.0–34.0)
MCHC: 32.5 g/dL (ref 30.0–36.0)
MCV: 87.7 fL (ref 80.0–100.0)
Platelets: 166 10*3/uL (ref 150–400)
RBC: 4.24 MIL/uL (ref 4.22–5.81)
RDW: 15 % (ref 11.5–15.5)
WBC: 3.3 10*3/uL — ABNORMAL LOW (ref 4.0–10.5)
nRBC: 0 % (ref 0.0–0.2)

## 2021-09-21 LAB — BASIC METABOLIC PANEL
Anion gap: 9 (ref 5–15)
BUN: 27 mg/dL — ABNORMAL HIGH (ref 8–23)
CO2: 26 mmol/L (ref 22–32)
Calcium: 9.3 mg/dL (ref 8.9–10.3)
Chloride: 101 mmol/L (ref 98–111)
Creatinine, Ser: 1.1 mg/dL (ref 0.61–1.24)
GFR, Estimated: >60 mL/min (ref >60)
Glucose, Bld: 103 mg/dL — ABNORMAL HIGH (ref 70–99)
Potassium: 4.6 mmol/L (ref 3.5–5.1)
Sodium: 136 mmol/L (ref 135–145)

## 2021-09-21 NOTE — Telephone Encounter (Signed)
Reviewed with patient that we would need repeat labs to be done because his previous labs will have expired for his upcoming cardioversion. Instructed him to go to [facility] and check in at registration at his convenience. He inquired if he could go today for those labs and advised that would be fine. He verbalized understanding with no further questions at this time.

## 2021-10-02 MED ORDER — SODIUM CHLORIDE 0.9 % IV SOLN
INTRAVENOUS | Status: DC
Start: 2021-10-02 — End: 2021-10-03

## 2021-10-03 ENCOUNTER — Ambulatory Visit: Payer: Medicare HMO | Admitting: Anesthesiology

## 2021-10-03 ENCOUNTER — Ambulatory Visit
Admission: RE | Admit: 2021-10-03 | Discharge: 2021-10-03 | Disposition: A | Discharge status: Home / Self Care | Payer: Medicare HMO | Attending: Cardiovascular Disease | Admitting: Cardiovascular Disease

## 2021-10-03 ENCOUNTER — Encounter
Admission: RE | Disposition: A | Discharge status: Home / Self Care | Payer: Self-pay | Source: Home / Self Care | Attending: Cardiovascular Disease

## 2021-10-03 ENCOUNTER — Encounter: Payer: Self-pay | Admitting: Cardiovascular Disease

## 2021-10-03 DIAGNOSIS — Z79899 Other long term (current) drug therapy: Secondary | ICD-10-CM | POA: Insufficient documentation

## 2021-10-03 DIAGNOSIS — E785 Hyperlipidemia, unspecified: Secondary | ICD-10-CM | POA: Diagnosis not present

## 2021-10-03 DIAGNOSIS — R5381 Other malaise: Secondary | ICD-10-CM | POA: Diagnosis not present

## 2021-10-03 DIAGNOSIS — I4819 Other persistent atrial fibrillation: Secondary | ICD-10-CM

## 2021-10-03 DIAGNOSIS — I4891 Unspecified atrial fibrillation: Secondary | ICD-10-CM

## 2021-10-03 DIAGNOSIS — I272 Pulmonary hypertension, unspecified: Secondary | ICD-10-CM | POA: Insufficient documentation

## 2021-10-03 DIAGNOSIS — R001 Bradycardia, unspecified: Secondary | ICD-10-CM

## 2021-10-03 DIAGNOSIS — I110 Hypertensive heart disease with heart failure: Secondary | ICD-10-CM | POA: Diagnosis not present

## 2021-10-03 DIAGNOSIS — R5383 Other fatigue: Secondary | ICD-10-CM | POA: Diagnosis not present

## 2021-10-03 DIAGNOSIS — I4811 Longstanding persistent atrial fibrillation: Secondary | ICD-10-CM | POA: Diagnosis not present

## 2021-10-03 DIAGNOSIS — I5032 Chronic diastolic (congestive) heart failure: Secondary | ICD-10-CM | POA: Diagnosis not present

## 2021-10-03 HISTORY — PX: CARDIOVERSION: SHX1299

## 2021-10-03 SURGERY — CARDIOVERSION
Anesthesia: General

## 2021-10-03 MED ORDER — PROPOFOL 10 MG/ML IV BOLUS
INTRAVENOUS | Status: DC | PRN
  Administered 2021-10-03: 60 mg via INTRAVENOUS

## 2021-10-03 MED ORDER — PROPOFOL 10 MG/ML IV BOLUS
INTRAVENOUS | Status: AC
  Filled 2021-10-03: qty 20

## 2021-10-03 NOTE — Interval H&P Note (Signed)
History and Physical Interval Note:  10/03/2021 7:40 AM  Zachary Green  has presented today for surgery, with the diagnosis of Cardioversion   AFib.  The various methods of treatment have been discussed with the patient and family. After consideration of risks, benefits and other options for treatment, the patient has consented to  Procedure(s): CARDIOVERSION (N/A) as a surgical intervention.  The patient's history has been reviewed, patient examined, no change in status, stable for surgery.  I have reviewed the patient's chart and labs.  Questions were answered to the patient's satisfaction.     [name]

## 2021-10-03 NOTE — Transfer of Care (Signed)
Immediate Anesthesia Transfer of Care Note  Patient: Zachary Green  Procedure(s) Performed: CARDIOVERSION  Patient Location: PACU  Anesthesia Type:General  Level of Consciousness: awake  Airway & Oxygen Therapy: Patient Spontanous Breathing and Patient connected to nasal cannula oxygen  Post-op Assessment: Report given to RN and Post -op Vital signs reviewed and stable  Post vital signs: Reviewed and stable  Last Vitals:  Vitals Value Taken Time  BP 132/75 10/03/21 0735  Temp    Pulse 61 10/03/21 0738  Resp 21 10/03/21 0738  SpO2 96 % 10/03/21 0738    Last Pain:  Vitals:   10/03/21 0657  TempSrc: Oral  PainSc: 0-No pain         Complications: No notable events documented.

## 2021-10-03 NOTE — CV Procedure (Signed)
Cardioversion note: A standard informed consent was obtained. Timeout was performed. The pads were placed in the anterior posterior fashion. The patient was given propofol by the anesthesia team.  Successful cardioversion was performed with a 200 J. The patient converted to sinus rhythm. Pre-and post EKGs were reviewed. The patient tolerated the procedure with no immediate complications.  Recommendations: Continue same medications and follow-up in 2-3 weeks.  

## 2021-10-03 NOTE — Anesthesia Preprocedure Evaluation (Signed)
Anesthesia Evaluation  Patient identified by MRN, date of birth, ID band Patient awake    Reviewed: Allergy & Precautions, NPO status , Patient's Chart, lab work & pertinent test results  History of Anesthesia Complications Negative for: history of anesthetic complications  Airway Mallampati: II  TM Distance: >3 FB Neck ROM: Full    Dental  (+) Upper Dentures   Pulmonary shortness of breath (from a fib), neg sleep apnea, neg COPD, neg recent URI, Patient abstained from smoking.Not current smoker, former smoker,    Pulmonary exam normal breath sounds clear to auscultation       Cardiovascular Exercise Tolerance: Good METShypertension, (-) angina+CHF  (-) CAD, (-) Past MI and (-) Cardiac Stents + dysrhythmias Atrial Fibrillation + Valvular Problems/Murmurs  Rhythm:Irregular Rate:Normal - Systolic murmurs 1. Left ventricular ejection fraction, by estimation, is 60 to 65%. The  left ventricle has normal function. The left ventricle has no regional  wall motion abnormalities. There is moderate left ventricular hypertrophy.  Left ventricular diastolic  parameters are indeterminate.  2. Right ventricular systolic function is normal. The right ventricular  size is normal. There is mildly elevated pulmonary artery systolic  pressure.  3. Left atrial size was moderately dilated.  4. The mitral valve is normal in structure. Mild mitral valve  regurgitation.    Neuro/Psych negative neurological ROS  negative psych ROS   GI/Hepatic GERD  Controlled,(+)     (-) substance abuse  ,   Endo/Other  neg diabetes  Renal/GU negative Renal ROS     Musculoskeletal   Abdominal   Peds  Hematology   Anesthesia Other Findings Past Medical History: No date: Actinic keratosis No date: Arthritis 12/24/2013: Basal cell carcinoma     Comment:  Left sup. lat. chest. Nodular pattern. Tx: [facility] 12/23/2014: Basal cell carcinoma     Comment:   Right lateral top of shoulder. Nodular pattern. Tx: [facility] 01/05/2016: Basal cell carcinoma     Comment:  Right ant. shoulder infraclavicular. Nodular pattern.               Tx: EDC No date: BPH (benign prostatic hypertrophy) No date: Chronic diastolic CHF (congestive heart failure) ([place])     Comment:  a. echo 12/17: 55-60%, no RWMA, trivial AI, moderate MR               with systolic bowing without prolapse, moderate TR, mild               biatrial enlargement, PASP 50 mmHg No date: Diverticulitis No date: ED (erectile dysfunction) No date: GERD (gastroesophageal reflux disease) No date: Hyperlipidemia No date: Hypertension No date: Impaired fasting glucose No date: Persistent atrial fibrillation (HCC)     Comment:  a. on Eliquis, amiodarone, Toprol; b. s/p successful               dccv 03/28/16; c. CHADS2VASc at least 3 (HTN, age x 2) 05/23/2007: Personal history of colonic adenoma     Comment:  04/2007 - 6 mm adenoma No date: Prostate cancer ([place]) No date: Rosacea No date: Schatzki's ring     Comment:  grade 1 varices  Reproductive/Obstetrics                             Anesthesia Physical  Anesthesia Plan  ASA: 3  Anesthesia Plan: General   Post-op Pain Management:    Induction: Intravenous  PONV Risk Score and Plan: 2 and Propofol infusion  and TIVA  Airway Management Planned: Nasal Cannula  Additional Equipment: None  Intra-op Plan:   Post-operative Plan:   Informed Consent: I have reviewed the patients History and Physical, chart, labs and discussed the procedure including the risks, benefits and alternatives for the proposed anesthesia with the patient or authorized representative who has indicated his/her understanding and acceptance.     Dental advisory given  Plan Discussed with: CRNA and Surgeon  Anesthesia Plan Comments: (Discussed risks of anesthesia with patient, including possibility of difficulty with spontaneous ventilation  under anesthesia necessitating airway intervention, PONV, and rare risks such as cardiac or respiratory or neurological events. Patient understands.)        Anesthesia Quick Evaluation

## 2021-10-07 NOTE — Anesthesia Postprocedure Evaluation (Signed)
Anesthesia Post Note  Patient: Zachary Green  Procedure(s) Performed: CARDIOVERSION  Patient location during evaluation: PACU Anesthesia Type: General Level of consciousness: awake and alert Pain management: pain level controlled Vital Signs Assessment: post-procedure vital signs reviewed and stable Respiratory status: spontaneous breathing, nonlabored ventilation, respiratory function stable and patient connected to nasal cannula oxygen Cardiovascular status: blood pressure returned to baseline and stable Postop Assessment: no apparent nausea or vomiting Anesthetic complications: no   No notable events documented.   Last Vitals:  Vitals:   10/03/21 0800 10/03/21 0815  BP: 128/69 127/73  Pulse: 67 (!) 59  Resp: 12 16  Temp:    SpO2: 95% 98%    Last Pain:  Vitals:   10/03/21 0815  TempSrc:   PainSc: 0-No pain                 Martha Clan

## 2021-10-17 NOTE — Progress Notes (Unsigned)
Cardiology Office Note    Date:  10/18/2021   ID:  CAPRI RABEN, DOB Apr 01, 1935, MRN [id number]  PCP:  [name], MD  Cardiologist:  [name], MD  Electrophysiologist:  Vickie Epley, MD   Chief Complaint: Follow-up  History of Present Illness:   CASHEL BELLINA is a 86 y.o. male with history of permanent Afib on Eliquis s/p prior DCCV s/p repeat unsuccessful DCCV 06/07/2020 for recurrent Afib status post repeat DCCV in 09/2021, aortic atherosclerosis, HFpEF/pulmonary hypertension, pulmonary nodules, BPH and prostate cancer s/p radiation therapy, HTN, HLD, prior tobacco use for 5 years in his 64s, and family history notable for Afib who presents for follow up of DCCV.    Echo in 2017 showed a normal LVSF, moderate MR/TR, and moderate pulmonary hypertension. He was treated with low dose Lasix with stable symptoms noted in follow up. Nuclear stress test in 2017 was normal.    In follow up in 11/2019, he was maintaining sinus rhythm.   He was seen in 04/2020 with recurrent palpitations, dizziness, fatigue, and increased SOB that dated back to 03/2020. He was noted to be in Afib with ventricular rate of 100 bpm. He was on amiodarone 100 mg daily and was reloaded followed by increased dose of maintenance dose of 200 mg daily. His Toprol was also increased to 25 mg daily. Echo on 05/27/2020 showed an EF of 60-65%, no RWMA, indeterminate LV diastolic function parameters, normal RVSF and ventricular cavity size, mildly elevated PASP at 36.2 mmHg, moderately dilated left atrium at 4.6 cm, and mild mitral regurgitation. When he was seen in follow up in 05/2020, he remained in Afib with controlled ventricular response and continued to note exertional dyspnea.  He underwent briefly successful DCCV on 7/98/9211 without complication.  When he was seen in follow-up on 07/01/2020 he was noted to be in atrial flutter.  He noted persistent lightheadedness and fatigue which were largely unchanged when  compared to his prior visits.  His case was discussed with his primary cardiologist, and given he had failed amiodarone this was discontinued.  Toprol-XL was titrated to 37.'5mg'$  twice daily.  In an effort to prevent significant hypotension losartan was tapered to 50 mg.  He was referred to EP for further management, at the request of his cardiologist.  Upon establishing with EP in 07/2020, it was felt his underlying A. fib was not contributing to the majority of his symptoms with recommendation to pursue rate control.  Main complaint at that time was brain fog and headaches.  He was seen by EP in 10/2020 and continued to report fatigue and underlying brain fogginess.  It was unclear what was leading to this.  Metoprolol was decreased to 25 mg 2 times daily with recommendation for him to follow-up with his PCP.  Of note, MRI of the brain in 06/2020 showed no acute intracranial abnormality.  He was seen in the office on 04/29/2021, and noted a significant improvement in his functional status and quality of life following a steroid hip injection.  He was without symptoms of angina or decompensation.  He did continue to note some cognitive decline and "fogginess."  Toprol was decreased from 25 mg to 12.5 mg twice daily.  He was seen in the office on 06/09/2021 and overall felt similar to how he did at his last visit with stable exertional dyspnea and fatigue, without symptoms of angina or decompensation.  His main complaint at that time continued to be cognitive decline and mental "  fogginess."  He did not notice a significant change in the symptoms following the decreasing of metoprolol.  BP and ventricular rates remained well controlled.  We discontinued metoprolol altogether and underwent a trial of Cardizem CD 120 mg daily.  He was seen in the office on 07/11/2021 and continued to feel the same as he had at his prior visits without any significant change in the way he felt off beta-blocker.  He was tolerating Cardizem CD.   We underwent a trial of holding his statin to see if this would improve his malaise, fatigue, and mental "fogginess."  He was seen on 08/17/2021 and continued to feel the same, despite holding statin.  It was also noted he had ran out of losartan for a couple of days and also noted no change in his symptoms.  Despite close follow-up and extensive medication tapering/holidays he noted no change in symptoms.  Given this, it was felt further medication holidays/tapering would likely have a low yield and we elected to again pursue rhythm control strategy to see if this helps with his symptoms.  With this, he was initiated on amiodarone with plans for repeat DCCV.  He was last seen in the office on 09/20/2021 and continued to feel the same.  He had inadvertently discontinued amiodarone several days prior to his visit with recommendation to resume medication.  He underwent successful DCCV on 10/03/2021.  He comes in today accompanied by his son.  He notes while he was in documented sinus rhythm following DCCV from 7/10 to approximately 7/22 or 7/23, he had an improvement in his overall functional status with noted less shortness of breath, less lightheadedness, and an improvement in his overall quality of life.  However, he noted a return of his exertional dyspnea, lightheadedness, and decreased functional status on 7/22 or 7/23.  Around this time, he also noted an increase in his heart rates.  While he was in documented sinus rhythm his rates were frequently in the 60s to 70s bpm.  However, around 7/23 he noted his heart rates trended into the 80s to 90s bpm.  He is documented to be back in A-fib this afternoon in the office.  He does feel like there was an improvement in his symptoms while in sinus rhythm.  No falls or symptoms concerning for bleeding.  No angina or decompensation.  Blood pressure has been stable.  No syncope.  He remains very interested in pursuing rhythm control strategy as well as possible  ablation.   Labs independently reviewed: 08/2021 - Hgb 12.1, PLT 166, potassium 4.6, BUN 27, serum creatinine 1.1 08/2021 - albumin 4.3, AST/ALT normal, TC 169, TG 126, HDL 54, LDL 90, A1c 6.2 07/2021 - TSH normal 06/2020 - magnesium 1.9  Past Medical History:  Diagnosis Date   Actinic keratosis    Arthritis    Bladder neck contracture    BPH associated with nocturia    Chronic diastolic CHF (congestive heart failure) [facility]) cardiologist--- dr a. muhammad   a. echo 12/17: 55-60%, no RWMA, trivial AI, moderate MR with systolic bowing without prolapse, moderate TR, mild biatrial enlargement, PASP 50 mmHg   Diverticulosis of colon    DOE (dyspnea on exertion)    07-06-2020  per pt sob with chores around the house, with yard work, stairs, and long distances gets sob   ED (erectile dysfunction)    GERD (gastroesophageal reflux disease)    History of basal cell carcinoma (BCC) excision    excision's ;   09/  2015 left upper lateral chest;  09/ 2016  right lateral top shoulder;  10/ 2017 right anterior clavical area    History of diverticulitis of colon    History of gastric polyp    followed by dr Jerilynn Mages. Guadlupe Spanish (gi)---  previous adenomatous low grade   History of urinary retention    Hyperlipidemia    Hypertension    followed by pcp and cardiology   Light-headed feeling    07-06-2020  per pt when he goes in and out of afib get light headed   Macular degeneration, right eye    Multiple pulmonary nodules    followed by pcp   Persistent atrial fibrillation [facility]) followed by cardiology--- dr a. Rogue Jury -- first dx 12/ 2017   a. on Eliquis, amiodarone, Toprol; b. s/p successful dccv 03/28/16  and 06-07-2020; c. CHADS2VASc at least 3 (HTN, age x 2)   Personal history of colonic adenoma 05/23/2007   04/2007 - 6 mm adenoma   Prostate cancer [facility]) urologist--- dr Diona Fanti   dx 2017 ,  post HIFU ablative therapy 07/ 2017 and completed external radiation 04/ 2019   Rosacea    followed by dr d.  Nehemiah Massed (dermotology)   Schatzki's ring    grade 1 varices   Wears dentures    upper   Wears glasses     Past Surgical History:  Procedure Laterality Date   CARDIOVERSION N/A 06/07/2020   Procedure: CARDIOVERSION;  Surgeon: Wellington Hampshire, MD;  Location: [facility] ORS;  Service: Cardiovascular;  Laterality: N/A;   CARDIOVERSION N/A 10/03/2021   Procedure: CARDIOVERSION;  Surgeon: Wellington Hampshire, MD;  Location: [facility] ORS;  Service: Cardiovascular;  Laterality: N/A;   CATARACT EXTRACTION W/ INTRAOCULAR LENS  IMPLANT, BILATERAL  2010 ;  2011   COLONOSCOPY  last one 2014  '@[facility]'$    ELECTROPHYSIOLOGIC STUDY N/A 03/28/2016   Procedure: CARDIOVERSION;  Surgeon: Minna Merritts, MD;  Location: [facility] ORS;  Service: Cardiovascular;  Laterality: N/A;   ESOPHAGOGASTRODUODENOSCOPY  last one 03-04-2018  '@[facility]'$    ESOPHAGOGASTRODUODENOSCOPY (EGD) WITH PROPOFOL N/A 08/18/2016   Procedure: ESOPHAGOGASTRODUODENOSCOPY (EGD) WITH PROPOFOL;  Surgeon: Lollie Sails, MD;  Location: [facility] ENDOSCOPY;  Service: Endoscopy;  Laterality: N/A;   GREEN LIGHT LASER TURP (TRANSURETHRAL RESECTION OF PROSTATE N/A 08/17/2015   Procedure: GREEN LIGHT LASER TURP (TRANSURETHRAL RESECTION OF PROSTATE;  Surgeon: Royston Cowper, MD;  Location: [facility] ORS;  Service: Urology;  Laterality: N/A;   NM MYOVIEW LTD     PATELLA FRACTURE SURGERY  1984   pin placed   TONSILLECTOMY  1949   TRANSURETHRAL RESECTION OF BLADDER NECK N/A 07/12/2020   Procedure: TRANSURETHRAL RESECTION OF BLADDER NECK;  Surgeon: Franchot Gallo, MD;  Location: [facility];  Service: Urology;  Laterality: N/A;   UPPER ESOPHAGEAL ENDOSCOPIC ULTRASOUND (EUS) N/A 09/21/2016   Procedure: UPPER ESOPHAGEAL ENDOSCOPIC ULTRASOUND (EUS);  Surgeon: Reita Cliche, MD;  Location: [facility] ENDOSCOPY;  Service: Gastroenterology;  Laterality: N/A;    Current Medications: Current Meds  Medication Sig   Diclofenac Sodium 3 % GEL Apply 1 application. topically as  needed (pain).   diltiazem (CARDIZEM CD) 240 MG 24 hr capsule Take 1 capsule (240 mg total) by mouth daily.   docusate sodium (COLACE) 100 MG capsule Take 100 mg by mouth 2 (two) times daily as needed (constipation).   doxycycline (VIBRAMYCIN) 50 MG capsule 1 CAPSULE BY MOUTH DAILY TAKE WITH FOOD AND PLENTY OF FLUID   ELIQUIS 5 MG TABS tablet TAKE 1  TABLET BY MOUTH TWICE A DAY   esomeprazole (NEXIUM) 20 MG capsule Take 20 mg by mouth daily before breakfast.   furosemide (LASIX) 20 MG tablet TAKE 1 TABLET BY MOUTH EVERY DAY   losartan (COZAAR) 50 MG tablet TAKE 1 TABLET BY MOUTH EVERY DAY   metroNIDAZOLE (METROGEL) 1 % gel Apply to face at night   Multiple Vitamins-Minerals (PRESERVISION AREDS PO) Take 1 tablet by mouth in the morning and at bedtime.   mupirocin ointment (BACTROBAN) 2 % Apply 1 application. topically as needed (rash).   Omega-3 Fatty Acids (FISH OIL) 1000 MG CAPS Take 1,000 mg by mouth in the morning and at bedtime.   permethrin (ELIMITE) 5 % cream Apply 1 application. topically 2 (two) times daily. Applied to cheeks   Polyethyl Glycol-Propyl Glycol (SYSTANE OP) Place 1 drop into both eyes 2 (two) times daily as needed (scheduled in the morning & in the evening if needed for dry/irritated eyes.).   pravastatin (PRAVACHOL) 40 MG tablet TAKE ONE TABLET BY MOUTH EVERY DAY   tamsulosin (FLOMAX) 0.4 MG CAPS capsule Take 1 capsule (0.4 mg total) by mouth daily after supper.   vitamin E 180 MG (400 UNITS) capsule Take 400 Units by mouth in the morning.   [DISCONTINUED] amiodarone (PACERONE) 200 MG tablet Take 1 tablet (200 mg total) by mouth daily.   [DISCONTINUED] diltiazem (CARDIZEM CD) 120 MG 24 hr capsule Take 1 capsule (120 mg total) by mouth daily.    Allergies:   Codeine   Social History   Socioeconomic History   Marital status: Married    Spouse name: Katharine Look   Number of children: 4   Years of education: Not on file   Highest education level: Not on file  Occupational  History   Occupation: retired Teacher, music: retired  Tobacco Use   Smoking status: Former    Packs/day: 1.00    Years: 3.00    Total pack years: 3.00    Types: Cigarettes    Quit date: 03/28/1959    Years since quitting: 62.6   Smokeless tobacco: Former    Types: Chew    Quit date: 07/06/1968  Vaping Use   Vaping Use: Never used  Substance and Sexual Activity   Alcohol use: No   Drug use: Never   Sexual activity: Not on file  Other Topics Concern   Not on file  Social History Narrative   Has living will   Wife is [facility] health care POA (then oldest son, Sherren Mocha)   Not sure about DNR---will accept resuscitation for now   Probably would not want a feeding tube   Social Determinants of Health   Financial Resource Strain: Not on file  Food Insecurity: Not on file  Transportation Needs: Not on file  Physical Activity: Not on file  Stress: Not on file  Social Connections: Not on file     Family History:  The patient's family history includes Alzheimer's disease in his mother; Cancer in his mother; Diabetes in his brother; Heart disease in his brother; Lung cancer in his brother.  ROS:   12-point review of systems is negative unless otherwise noted in the HPI.   EKGs/Labs/Other Studies Reviewed:    Studies reviewed were summarized above. The additional studies were reviewed today:  Lexiscan MPI 07/19/2020: There was no ST segment deviation noted during stress. The study is normal. The left ventricular ejection fraction is normal (55-65%). This is a low risk study. CT attenuation images  showed mild coronary and aortic calcifications. __________   2D echo 05/27/2020: 1. Left ventricular ejection fraction, by estimation, is 60 to 65%. The  left ventricle has normal function. The left ventricle has no regional  wall motion abnormalities. There is moderate left ventricular hypertrophy.  Left ventricular diastolic  parameters are indeterminate.   2. Right ventricular  systolic function is normal. The right ventricular  size is normal. There is mildly elevated pulmonary artery systolic  pressure.   3. Left atrial size was moderately dilated.   4. The mitral valve is normal in structure. Mild mitral valve  Regurgitation. __________   Carlton Adam MPI 03/16/2016: There was no ST segment deviation noted during stress. The study is normal. This is a low risk study. The left ventricular ejection fraction is normal (55-65%). __________   2D echo 03/09/2016: - Left ventricle: The cavity size was normal. There was moderate    concentric hypertrophy. Systolic function was normal. The    estimated ejection fraction was in the range of 55% to 60%. Wall    motion was normal; there were no regional wall motion    abnormalities.  - Aortic valve: There was trivial regurgitation.  - Mitral valve: Systolic bowing without prolapse. There was    moderate regurgitation.  - Left atrium: The atrium was mildly dilated.  - Right atrium: The atrium was mildly dilated.  - Tricuspid valve: There was moderate regurgitation.  - Pulmonary arteries: Systolic pressure was moderately increased.    PA peak pressure: 50 mm Hg (S).   EKG:  EKG is ordered today.  The EKG ordered today demonstrates A-fib, 89 bpm, nonspecific ST-T changes, no significant change when compared to prior tracings in the office  Recent Labs: 08/17/2021: ALT 18; TSH 2.501 09/21/2021: BUN 27; Creatinine, Ser 1.10; Hemoglobin 12.1; Platelets 166; Potassium 4.6; Sodium 136  Recent Lipid Panel    Component Value Date/Time   CHOL 181 11/22/2010 1012   TRIG 82.0 11/22/2010 1012   HDL 54.40 11/22/2010 1012   CHOLHDL 3 11/22/2010 1012   VLDL 16.4 11/22/2010 1012   LDLCALC 110 (H) 11/22/2010 1012   LDLDIRECT 126.2 05/01/2007 0954    PHYSICAL EXAM:    VS:  BP 130/80 (BP Location: Left Arm, Patient Position: Sitting, Cuff Size: Large)   Pulse 89   Ht '5\' 7"'$  (1.702 m)   Wt 213 lb (96.6 kg)   SpO2 96%   BMI  33.36 kg/m   BMI: Body mass index is 33.36 kg/m.  Physical Exam Vitals reviewed.  Constitutional:      Appearance: He is well-developed.  HENT:     Head: Normocephalic and atraumatic.  Eyes:     General:        Right eye: No discharge.        Left eye: No discharge.  Cardiovascular:     Rate and Rhythm: Normal rate. Rhythm irregularly irregular.     Heart sounds: Normal heart sounds, S1 normal and S2 normal. Heart sounds not distant. No midsystolic click and no opening snap. No murmur heard.    No friction rub.  Pulmonary:     Effort: Pulmonary effort is normal. No respiratory distress.     Breath sounds: Normal breath sounds. No decreased breath sounds, wheezing or rales.  Chest:     Chest wall: No tenderness.  Abdominal:     General: There is no distension.  Musculoskeletal:     Cervical back: Normal range of motion.  Skin:    General:  Skin is warm and dry.     Nails: There is no clubbing.  Neurological:     Mental Status: He is alert and oriented to person, place, and time.  Psychiatric:        Speech: Speech normal.        Behavior: Behavior normal.        Thought Content: Thought content normal.        Judgment: Judgment normal.     Wt Readings from Last 3 Encounters:  10/18/21 213 lb (96.6 kg)  10/03/21 212 lb (96.2 kg)  09/20/21 215 lb (97.5 kg)     ASSESSMENT & PLAN:   Persistent A-fib: He underwent repeat DCCV following amiodarone loading earlier this month with noted improvement in his functional status, dyspnea, lightheadedness, and quality of life.  However, around 7/22 he noted an increase in his heart rates along with a return of his dyspnea, lightheadedness, and decreased functional status.  She notes an improvement in his symptoms while he was in documented sinus rhythm.  He has noted to be back in A-fib today with suspicion that his rhythm reverted back to A-fib around 7/22 or 7/23.  He remains very interested in pursuing rhythm control strategies as  he feels like he had improvement while in documented sinus rhythm.  He has failed amiodarone.  We will rerefer him back to EP for consideration of Tikosyn loading (renal function normal) versus A-fib ablation given it appears he has a symptoms improved in sinus rhythm, and in following his wishes to pursue rhythm control strategy.  We will titrate Cardizem CD to 240 mg daily.  He will send [place] an update with BP readings and heart rates in 1 week with recommendation to further escalate diltiazem as/if indicated.  Given a CHA2DS2-VASc of at least 5 he remains on apixaban and does not meet reduced dosing criteria.  No symptoms concerning for bleeding.  HFpEF/pulmonary hypertension: He appears euvolemic and well compensated.  Recent echo demonstrated preserved LV systolic function with stable mild mitral regurgitation and improved PASP.  He remains on low-dose furosemide.  Renal function and electrolytes stable.  HTN: Blood pressure is reasonably controlled in the office.  Continue Cardizem CD as outlined above along with losartan.  HLD: LDL 81 in 02/2021.  He remains on pravastatin.    Disposition: F/u with Dr. Fletcher Anon or an APP in 3 months.   Medication Adjustments/Labs and Tests Ordered: Current medicines are reviewed at length with the patient today.  Concerns regarding medicines are outlined above. Medication changes, Labs and Tests ordered today are summarized above and listed in the Patient Instructions accessible in Encounters.   Signed, [name], PA-C 10/18/2021 4:35 PM     [address], [place] [address] [phone]

## 2021-10-18 ENCOUNTER — Ambulatory Visit: Payer: Medicare HMO | Admitting: Physician Assistant

## 2021-10-18 ENCOUNTER — Encounter: Payer: Self-pay | Admitting: Physician Assistant

## 2021-10-18 VITALS — BP 130/80 | HR 89 | Ht 67.0 in | Wt 213.0 lb

## 2021-10-18 DIAGNOSIS — R5381 Other malaise: Secondary | ICD-10-CM | POA: Diagnosis not present

## 2021-10-18 DIAGNOSIS — I4811 Longstanding persistent atrial fibrillation: Secondary | ICD-10-CM | POA: Diagnosis not present

## 2021-10-18 DIAGNOSIS — I5032 Chronic diastolic (congestive) heart failure: Secondary | ICD-10-CM

## 2021-10-18 DIAGNOSIS — R0609 Other forms of dyspnea: Secondary | ICD-10-CM | POA: Diagnosis not present

## 2021-10-18 DIAGNOSIS — E785 Hyperlipidemia, unspecified: Secondary | ICD-10-CM

## 2021-10-18 DIAGNOSIS — I10 Essential (primary) hypertension: Secondary | ICD-10-CM | POA: Diagnosis not present

## 2021-10-18 MED ORDER — DILTIAZEM HCL ER COATED BEADS 240 MG PO CP24: 240.0000 mg | ORAL_CAPSULE | Freq: Every day | ORAL | 3 refills | Status: DC

## 2021-10-18 NOTE — Patient Instructions (Signed)
Please monitor blood pressures and heart rates for one week. Send those in My Chart message for provider to review.   We also placed a second referral to see Dr. Quentin Ore for consideration of Tikosyn.     Medication Instructions:  Your physician has recommended you make the following change in your medication:   STOP Amiodarone INCREASE Cardizem 240 mg once daily   *If you need a refill on your cardiac medications before your next appointment, please call your pharmacy*   Lab Work: None  If you have labs (blood work) drawn today and your tests are completely normal, you will receive your results only by: [facility] (if you have MyChart) OR A paper copy in the mail If you have any lab test that is abnormal or we need to change your treatment, we will call you to review the results.   Testing/Procedures: None   Follow-Up: At [facility], you and your health needs are our priority.  As part of our continuing mission to provide you with exceptional heart care, we have created designated Provider Care Teams.  These Care Teams include your primary Cardiologist (physician) and Advanced Practice Providers (APPs -  Physician Assistants and Nurse Practitioners) who all work together to provide you with the care you need, when you need it.   Your next appointment:   3 month(s)  The format for your next appointment:   In Person  Provider:   [name], MD or [name], PA-C       Important Information About Sugar

## 2021-10-26 ENCOUNTER — Telehealth: Payer: Self-pay | Admitting: Cardiovascular Disease

## 2021-10-26 NOTE — Telephone Encounter (Signed)
Pt's son is calling to give vitals as requested by [name], PA:    7/26 - 138/70  89 7/27 - 150/84  85 7/28 - 146/77  88 7/29 - 143/77  98 7/30 - 145/80  75 7/31 - 136/82  88 8/1 -   146/95  87 8/2 -   145/78  49  Pt's son states that the pt is tolerating dosage increase of the diltiazem pretty well.

## 2021-10-27 MED ORDER — DILTIAZEM HCL ER 360 MG PO TB24: 360.0000 mg | ORAL_TABLET | Freq: Every day | ORAL | 3 refills | Status: DC

## 2021-10-27 MED ORDER — DILTIAZEM HCL ER COATED BEADS 360 MG PO CP24: 360.0000 mg | ORAL_CAPSULE | Freq: Every day | ORAL | 3 refills | Status: DC

## 2021-10-27 NOTE — Telephone Encounter (Signed)
Thank you for the update.  Blood pressure and heart rates are overall stable and consistent with what he has been running in the office.  If patient and son are agreeable, we can titrate Cardizem CD to 360 mg daily in an effort to more consistently get ventricular rates hopefully to the 70s bpm.

## 2021-10-27 NOTE — Telephone Encounter (Signed)
Spoke with patients son per release form. Reviewed provider recommendations and he was agreeable with plan. No further questions at this time.

## 2021-11-17 ENCOUNTER — Other Ambulatory Visit: Payer: Self-pay | Admitting: Cardiovascular Disease

## 2021-12-13 NOTE — Progress Notes (Unsigned)
Electrophysiology Office Follow up Visit Note:    Date:  12/14/2021   ID:  Zachary Green, DOB Oct 26, 1935, MRN [id number]  PCP:  [name], MD  [facility] Cardiologist:  [name], MD  [facility] Electrophysiologist:  Vickie Epley, MD    Interval History:    Zachary Green is a 86 y.o. male who presents for a follow up visit. I last saw him 10/27/2020.  Zachary Green is a 86 y.o. male who presents for an evaluation of AF at the request of [name], PA-C. Their medical history includes AF on Eliquis, aortic atherosclerosis, HFpEF, pulm nodules, BPH and prostate cancer, HTN, HLD, tobacco use hx. He has been off and on amiodarone in the past and had multiple prior cardioversions. His most recent cardioversion 10/03/2021 lasted until 10/15/2021 or 10/16/2021. He reported improved functional status while in normal rhythm. At his appointment with Thurmond Butts, the amiodarone was discontinued and he is referred to discuss alternative rhythm control strategies (dofetilide vs ablation).   At my last appt, I felt like his AF was likely permanent.   He is with his son today in clinic who I previously met.  It is not clear to me that when he was in normal rhythm for those several days that he felt any different.  His main complaint to me today is headache and feeling like he is in a "fog".     Past Medical History:  Diagnosis Date   Actinic keratosis    Arthritis    Bladder neck contracture    BPH associated with nocturia    Chronic diastolic CHF (congestive heart failure) [facility]) cardiologist--- dr a. muhammad   a. echo 12/17: 55-60%, no RWMA, trivial AI, moderate MR with systolic bowing without prolapse, moderate TR, mild biatrial enlargement, PASP 50 mmHg   Diverticulosis of colon    DOE (dyspnea on exertion)    07-06-2020  per pt sob with chores around the house, with yard work, stairs, and long distances gets sob   ED (erectile dysfunction)    GERD (gastroesophageal reflux  disease)    History of basal cell carcinoma (BCC) excision    excision's ;   09/ 2015 left upper lateral chest;  09/ 2016  right lateral top shoulder;  10/ 2017 right anterior clavical area    History of diverticulitis of colon    History of gastric polyp    followed by dr Jerilynn Mages. Guadlupe Spanish (gi)---  previous adenomatous low grade   History of urinary retention    Hyperlipidemia    Hypertension    followed by pcp and cardiology   Light-headed feeling    07-06-2020  per pt when he goes in and out of afib get light headed   Macular degeneration, right eye    Multiple pulmonary nodules    followed by pcp   Persistent atrial fibrillation [facility]) followed by cardiology--- dr a. Rogue Jury -- first dx 12/ 2017   a. on Eliquis, amiodarone, Toprol; b. s/p successful dccv 03/28/16  and 06-07-2020; c. CHADS2VASc at least 3 (HTN, age x 2)   Personal history of colonic adenoma 05/23/2007   04/2007 - 6 mm adenoma   Prostate cancer [facility]) urologist--- dr Diona Fanti   dx 2017 ,  post HIFU ablative therapy 07/ 2017 and completed external radiation 04/ 2019   Rosacea    followed by dr d. Nehemiah Massed (dermotology)   Schatzki's ring    grade 1 varices   Wears dentures    upper  Wears glasses     Past Surgical History:  Procedure Laterality Date   CARDIOVERSION N/A 06/07/2020   Procedure: CARDIOVERSION;  Surgeon: Wellington Hampshire, MD;  Location: [facility] ORS;  Service: Cardiovascular;  Laterality: N/A;   CARDIOVERSION N/A 10/03/2021   Procedure: CARDIOVERSION;  Surgeon: Wellington Hampshire, MD;  Location: [facility] ORS;  Service: Cardiovascular;  Laterality: N/A;   CATARACT EXTRACTION W/ INTRAOCULAR LENS  IMPLANT, BILATERAL  2010 ;  2011   COLONOSCOPY  last one 2014  '@[facility]'    ELECTROPHYSIOLOGIC STUDY N/A 03/28/2016   Procedure: CARDIOVERSION;  Surgeon: Minna Merritts, MD;  Location: [facility] ORS;  Service: Cardiovascular;  Laterality: N/A;   ESOPHAGOGASTRODUODENOSCOPY  last one 03-04-2018  '@[facility]'    ESOPHAGOGASTRODUODENOSCOPY (EGD)  WITH PROPOFOL N/A 08/18/2016   Procedure: ESOPHAGOGASTRODUODENOSCOPY (EGD) WITH PROPOFOL;  Surgeon: Lollie Sails, MD;  Location: [facility] ENDOSCOPY;  Service: Endoscopy;  Laterality: N/A;   GREEN LIGHT LASER TURP (TRANSURETHRAL RESECTION OF PROSTATE N/A 08/17/2015   Procedure: GREEN LIGHT LASER TURP (TRANSURETHRAL RESECTION OF PROSTATE;  Surgeon: Royston Cowper, MD;  Location: [facility] ORS;  Service: Urology;  Laterality: N/A;   NM MYOVIEW LTD     PATELLA FRACTURE SURGERY  1984   pin placed   TONSILLECTOMY  1949   TRANSURETHRAL RESECTION OF BLADDER NECK N/A 07/12/2020   Procedure: TRANSURETHRAL RESECTION OF BLADDER NECK;  Surgeon: Franchot Gallo, MD;  Location: [facility];  Service: Urology;  Laterality: N/A;   UPPER ESOPHAGEAL ENDOSCOPIC ULTRASOUND (EUS) N/A 09/21/2016   Procedure: UPPER ESOPHAGEAL ENDOSCOPIC ULTRASOUND (EUS);  Surgeon: Reita Cliche, MD;  Location: [facility] ENDOSCOPY;  Service: Gastroenterology;  Laterality: N/A;    Current Medications: Current Meds  Medication Sig   Diclofenac Sodium 3 % GEL Apply 1 application. topically as needed (pain).   diltiazem (CARDIZEM CD) 360 MG 24 hr capsule Take 1 capsule (360 mg total) by mouth daily.   docusate sodium (COLACE) 100 MG capsule Take 100 mg by mouth 2 (two) times daily as needed (constipation).   doxycycline (VIBRAMYCIN) 50 MG capsule 1 CAPSULE BY MOUTH DAILY TAKE WITH FOOD AND PLENTY OF FLUID   ELIQUIS 5 MG TABS tablet TAKE 1 TABLET BY MOUTH TWICE A DAY   esomeprazole (NEXIUM) 20 MG capsule Take 20 mg by mouth daily before breakfast.   furosemide (LASIX) 20 MG tablet TAKE 1 TABLET BY MOUTH EVERY DAY   losartan (COZAAR) 50 MG tablet TAKE 1 TABLET BY MOUTH EVERY DAY   metroNIDAZOLE (METROGEL) 1 % gel Apply to face at night   Multiple Vitamins-Minerals (PRESERVISION AREDS PO) Take 1 tablet by mouth in the morning and at bedtime.   mupirocin ointment (BACTROBAN) 2 % Apply 1 application. topically as needed (rash).    Omega-3 Fatty Acids (FISH OIL) 1000 MG CAPS Take 1,000 mg by mouth in the morning and at bedtime.   permethrin (ELIMITE) 5 % cream Apply 1 application. topically 2 (two) times daily. Applied to cheeks   Polyethyl Glycol-Propyl Glycol (SYSTANE OP) Place 1 drop into both eyes 2 (two) times daily as needed (scheduled in the morning & in the evening if needed for dry/irritated eyes.).   pravastatin (PRAVACHOL) 40 MG tablet TAKE ONE TABLET BY MOUTH EVERY DAY   tamsulosin (FLOMAX) 0.4 MG CAPS capsule Take 1 capsule (0.4 mg total) by mouth daily after supper.   vitamin E 180 MG (400 UNITS) capsule Take 400 Units by mouth in the morning.     Allergies:   Codeine   Social  History   Socioeconomic History   Marital status: Married    Spouse name: Katharine Look   Number of children: 4   Years of education: Not on file   Highest education level: Not on file  Occupational History   Occupation: retired Teacher, music: retired  Tobacco Use   Smoking status: Former    Packs/day: 1.00    Years: 3.00    Total pack years: 3.00    Types: Cigarettes    Quit date: 03/28/1959    Years since quitting: 62.7   Smokeless tobacco: Former    Types: Chew    Quit date: 07/06/1968  Vaping Use   Vaping Use: Never used  Substance and Sexual Activity   Alcohol use: No   Drug use: Never   Sexual activity: Not on file  Other Topics Concern   Not on file  Social History Narrative   Has living will   Wife is [place] health care POA (then oldest son, Sherren Mocha)   Not sure about DNR---will accept resuscitation for now   Probably would not want a feeding tube   Social Determinants of Health   Financial Resource Strain: Not on file  Food Insecurity: Not on file  Transportation Needs: Not on file  Physical Activity: Not on file  Stress: Not on file  Social Connections: Not on file     Family History: The patient's family history includes Alzheimer's disease in his mother; Cancer in his mother; Diabetes in his  brother; Heart disease in his brother; Lung cancer in his brother.  ROS:   Please see the history of present illness.    All other systems reviewed and are negative.  EKGs/Labs/Other Studies Reviewed:    The following studies were reviewed today:     Recent Labs: 08/17/2021: ALT 18; TSH 2.501 09/21/2021: BUN 27; Creatinine, Ser 1.10; Hemoglobin 12.1; Platelets 166; Potassium 4.6; Sodium 136  Recent Lipid Panel    Component Value Date/Time   CHOL 181 11/22/2010 1012   TRIG 82.0 11/22/2010 1012   HDL 54.40 11/22/2010 1012   CHOLHDL 3 11/22/2010 1012   VLDL 16.4 11/22/2010 1012   LDLCALC 110 (H) 11/22/2010 1012   LDLDIRECT 126.2 05/01/2007 0954    Physical Exam:    VS:  BP 130/64   Pulse 80   Ht '5\' 7"'  (1.702 m)   Wt 219 lb (99.3 kg)   BMI 34.30 kg/m     Wt Readings from Last 3 Encounters:  12/14/21 219 lb (99.3 kg)  10/18/21 213 lb (96.6 kg)  10/03/21 212 lb (96.2 kg)     GEN:  Well nourished, well developed in no acute distress.  Obese.  Elderly. HEENT: Normal NECK: No JVD; No carotid bruits LYMPHATICS: No lymphadenopathy CARDIAC: Irregularly irregular, no murmurs, rubs, gallops RESPIRATORY:  Clear to auscultation without rales, wheezing or rhonchi  ABDOMEN: Soft, non-tender, non-distended MUSCULOSKELETAL: 1+ pitting bilateral lower extremity edema to the knees; No deformity  SKIN: Warm and dry NEUROLOGIC:  Alert and oriented x 3 PSYCHIATRIC:  Normal affect        ASSESSMENT:    1. Longstanding persistent atrial fibrillation ([place])   2. Chronic diastolic CHF (congestive heart failure) ([place])   3. Aortic atherosclerosis (HCC)    PLAN:    In order of problems listed above:  #AF Longstanding persistent versus permanent.  I suspect he is actually in permanent atrial fibrillation although the patient and his son are very interested in trying to pursue rhythm control.  I do not think this is unreasonable.  His options are very limited at this time.  Catheter  ablation is not an option.  I discussed Tikosyn with the patient and his son during today's visit including the risks and need for inpatient hospitalization for loading.  They are interested in pursuing this after his amiodarone has had time to washout.  I will plan to check an amiodarone level in December.  If his amiodarone level is acceptable, plan to bring into the hospital for Tikosyn loading.  I discussed the very real possibility that he would not maintain sinus rhythm on Tikosyn.  I also discussed using a permanent pacemaker and AV junction ablation with the patient has 100 today's visit.  They would like to hold off on this option until they give Tikosyn a shot.  #Chronic diastolic HF NYHA class II-III.  Warm and volume overloaded on exam today.  I will have him increase his Lasix to 40 mg by mouth once daily for 5 days.  I will have him touch base with [name], PA-C in 1 to 2 weeks to reassess his volume status.    Total time spent with patient today 41 minutes. This includes reviewing records, evaluating the patient and coordinating care.   Medication Adjustments/Labs and Tests Ordered: Current medicines are reviewed at length with the patient today.  Concerns regarding medicines are outlined above.  Orders Placed This Encounter  Procedures   Amiodarone level   No orders of the defined types were placed in this encounter.    Signed, Lars Mage, MD, [facility], [facility] 12/14/2021 5:30 PM    Electrophysiology  [facility]

## 2021-12-14 ENCOUNTER — Encounter: Payer: Self-pay | Admitting: Cardiology

## 2021-12-14 ENCOUNTER — Ambulatory Visit: Payer: Medicare HMO | Attending: Cardiology | Admitting: Cardiology

## 2021-12-14 VITALS — BP 130/64 | HR 80 | Ht 67.0 in | Wt 219.0 lb

## 2021-12-14 DIAGNOSIS — I700 Atherosclerosis of aorta: Secondary | ICD-10-CM

## 2021-12-14 DIAGNOSIS — I5032 Chronic diastolic (congestive) heart failure: Secondary | ICD-10-CM | POA: Diagnosis not present

## 2021-12-14 DIAGNOSIS — I4811 Longstanding persistent atrial fibrillation: Secondary | ICD-10-CM | POA: Diagnosis not present

## 2021-12-14 NOTE — Patient Instructions (Signed)
Medication Instructions:  Increase lasix to 40 mg for for 5 days then decrease back down to 20 mg  *If you need a refill on your cardiac medications before your next appointment, please call your pharmacy*   Lab Work: Amiodarone Level in December Medical Mall Entrance at [facility] 1st desk on the right to check in Lab hours: 7:30 am- 5:30 pm (walk in basis)  If you have labs (blood work) drawn today and your tests are completely normal, you will receive your results only by: MyChart Message (if you have MyChart) OR A paper copy in the mail If you have any lab test that is abnormal or we need to change your treatment, we will call you to review the results.   Testing/Procedures: None    Follow-Up: At [facility], you and your health needs are our priority.  As part of our continuing mission to provide you with exceptional heart care, we have created designated Provider Care Teams.  These Care Teams include your primary Cardiologist (physician) and Advanced Practice Providers (APPs -  Physician Assistants and Nurse Practitioners) who all work together to provide you with the care you need, when you need it.  We recommend signing up for the patient portal called "MyChart".  Sign up information is provided on this After Visit Summary.  MyChart is used to connect with patients for Virtual Visits (Telemedicine).  Patients are able to view lab/test results, encounter notes, upcoming appointments, etc.  Non-urgent messages can be sent to your provider as well.   To learn more about what you can do with MyChart, go to [email or web address].    Your next appointment:   1 -2 week(s)  The format for your next appointment:   In Person  Provider:   You will see one of the following Advanced Practice Providers on your designated Care Team:    [name], PA-C   Then, Dr. Lars Mage will plan to see you again in 6 month(s).    Other Instructions None   Important Information  About [place]

## 2021-12-18 ENCOUNTER — Other Ambulatory Visit: Payer: Self-pay | Admitting: Dermatology

## 2021-12-26 ENCOUNTER — Ambulatory Visit: Payer: Medicare HMO | Admitting: Physician Assistant

## 2022-01-31 ENCOUNTER — Ambulatory Visit: Payer: Medicare HMO | Admitting: Cardiovascular Disease

## 2022-02-02 NOTE — Progress Notes (Signed)
Cardiology Office Note    Date:  02/03/2022   ID:  RAMESSES CRAMPTON, DOB Oct 11, 1935, MRN [id number]  PCP:  [name], MD  Cardiologist:  [name], MD  Electrophysiologist:  Vickie Epley, MD   Chief Complaint: Follow-up  History of Present Illness:   THEDFORD BUNTON is a 86 y.o. male with history of longstanding persistent Afib on Eliquis s/p prior DCCV s/p repeat unsuccessful DCCV 06/07/2020 for recurrent Afib status post repeat DCCV in 09/2021, aortic atherosclerosis, HFpEF/pulmonary hypertension, pulmonary nodules, BPH and prostate cancer s/p radiation therapy, HTN, HLD, prior tobacco use for 5 years in his 90s, and family history notable for Afib who presents for follow up of Afib.    Echo in 2017 showed a normal LVSF, moderate MR/TR, and moderate pulmonary hypertension. He was treated with low dose Lasix with stable symptoms noted in follow up. Nuclear stress test in 2017 was normal.    In follow up in 11/2019, he was maintaining sinus rhythm.   He was seen in 04/2020 with recurrent palpitations, dizziness, fatigue, and increased SOB that dated back to 03/2020. He was noted to be in Afib with ventricular rate of 100 bpm. He was on amiodarone 100 mg daily and was reloaded followed by increased dose of maintenance dose of 200 mg daily. His Toprol was also increased to 25 mg daily. Echo on 05/27/2020 showed an EF of 60-65%, no RWMA, indeterminate LV diastolic function parameters, normal RVSF and ventricular cavity size, mildly elevated PASP at 36.2 mmHg, moderately dilated left atrium at 4.6 cm, and mild mitral regurgitation. When he was seen in follow up in 05/2020, he remained in Afib with controlled ventricular response and continued to note exertional dyspnea.  He underwent briefly successful DCCV on 0/05/4915 without complication.  When he was seen in follow-up on 07/01/2020 he was noted to be in atrial flutter.  He noted persistent lightheadedness and fatigue which were largely  unchanged when compared to his prior visits.  His case was discussed with his primary cardiologist, and given he had failed amiodarone this was discontinued.  Toprol-XL was titrated to 37.'5mg'$  twice daily.  In an effort to prevent significant hypotension losartan was tapered to 50 mg.  He was referred to EP for further management, at the request of his cardiologist.  Upon establishing with EP in 07/2020, it was felt his underlying A. fib was not contributing to the majority of his symptoms with recommendation to pursue rate control.  Main complaint at that time was brain fog and headaches.  He was seen by EP in 10/2020 and continued to report fatigue and underlying brain fogginess.  It was unclear what was leading to this.  Metoprolol was decreased to 25 mg 2 times daily with recommendation for him to follow-up with his PCP.  Of note, MRI of the brain in 06/2020 showed no acute intracranial abnormality.  He was seen in the office on 04/29/2021, and noted a significant improvement in his functional status and quality of life following a steroid hip injection.  He was without symptoms of angina or decompensation.  He did continue to note some cognitive decline and "fogginess."  Toprol was decreased from 25 mg to 12.5 mg twice daily.  He was seen in the office on 06/09/2021 and overall felt similar to how he did at his last visit with stable exertional dyspnea and fatigue, without symptoms of angina or decompensation.  His main complaint at that time continued to be cognitive decline and  mental "fogginess."  He did not notice a significant change in the symptoms following the decreasing of metoprolol.  BP and ventricular rates remained well controlled.  We discontinued metoprolol altogether and underwent a trial of Cardizem CD 120 mg daily.  He was seen in the office on 07/11/2021 and continued to feel the same as he had at his prior visits without any significant change in the way he felt off beta-blocker.  He was tolerating  Cardizem CD.  We underwent a trial of holding his statin to see if this would improve his malaise, fatigue, and mental "fogginess."  He was seen on 08/17/2021 and continued to feel the same, despite holding statin.  It was also noted he had ran out of losartan for a couple of days and also noted no change in his symptoms.  Despite close follow-up and extensive medication tapering/holidays he noted no change in symptoms.  Given this, it was felt further medication holidays/tapering would likely have a low yield and we elected to again pursue rhythm control strategy to see if this helps with his symptoms.  With this, he was initiated on amiodarone with plans for repeat DCCV.  He was seen in the office on 09/20/2021 and continued to feel the same.  He had inadvertently discontinued amiodarone several days prior to his visit with recommendation to resume medication.  He underwent successful DCCV on 10/03/2021.  He was seen in follow-up on 10/18/2021 and noted he did feel some improvement in his functional status with noted less shortness of breath, less lightheadedness, and an overall improvement in his quality of life while he was in documented sinus rhythm.  However, he noted a return of his exertional dyspnea, lightheadedness, and decreased functional status with concerns he had redeveloped A-fib.  He was noted to be back in A-fib at his visit on 7/25.  He remains interested in pursuing rhythm control.  Cardizem CD was titrated to 40 mg daily and he was referred to EP for further discussion of rhythm control strategy.  He was evaluated by EP on 12/14/2021 with recommendation to washout amiodarone with subsequent Tikosyn initiation there after.  Patient and son preferred to hold off on AV nodal ablation with PPM implantation until he underwent a trial of Tikosyn.  He comes in doing reasonably well from a cardiac perspective.  He is without symptoms of angina or decompensation.  He notes largely unchanged dyspnea and  fatigue as well as headaches.  Lower extremity swelling is mild and stable.  No progressive orthopnea.  He has been under increased stress lately at home surrounding the health of his family.  No falls or symptoms concerning for bleeding.  No presyncope or syncope.  He remains interested in pursuing rhythm control strategy, though at this time is uncertain if he would want to pursue Tikosyn versus AV nodal ablation with PPM implantation as catheter ablation is not an option.   Labs independently reviewed: 08/2021 - Hgb 12.1, PLT 166, potassium 4.6, BUN 27, serum creatinine 1.1 08/2021 - albumin 4.3, AST/ALT normal, TC 169, TG 126, HDL 54, LDL 90, A1c 6.2 07/2021 - TSH normal 06/2020 - magnesium 1.9  Past Medical History:  Diagnosis Date   Actinic keratosis    Arthritis    Bladder neck contracture    BPH associated with nocturia    Chronic diastolic CHF (congestive heart failure) [facility]) cardiologist--- dr a. muhammad   a. echo 12/17: 55-60%, no RWMA, trivial AI, moderate MR with systolic bowing without prolapse, moderate  TR, mild biatrial enlargement, PASP 50 mmHg   Diverticulosis of colon    DOE (dyspnea on exertion)    07-06-2020  per pt sob with chores around the house, with yard work, stairs, and long distances gets sob   ED (erectile dysfunction)    GERD (gastroesophageal reflux disease)    History of basal cell carcinoma (BCC) excision    excision's ;   09/ 2015 left upper lateral chest;  09/ 2016  right lateral top shoulder;  10/ 2017 right anterior clavical area    History of diverticulitis of colon    History of gastric polyp    followed by dr Jerilynn Mages. Guadlupe Spanish (gi)---  previous adenomatous low grade   History of urinary retention    Hyperlipidemia    Hypertension    followed by pcp and cardiology   Light-headed feeling    07-06-2020  per pt when he goes in and out of afib get light headed   Macular degeneration, right eye    Multiple pulmonary nodules    followed by pcp   Persistent  atrial fibrillation [facility]) followed by cardiology--- dr a. Rogue Jury -- first dx 12/ 2017   a. on Eliquis, amiodarone, Toprol; b. s/p successful dccv 03/28/16  and 06-07-2020; c. CHADS2VASc at least 3 (HTN, age x 2)   Personal history of colonic adenoma 05/23/2007   04/2007 - 6 mm adenoma   Prostate cancer [facility]) urologist--- dr Diona Fanti   dx 2017 ,  post HIFU ablative therapy 07/ 2017 and completed external radiation 04/ 2019   Rosacea    followed by dr d. Nehemiah Massed (dermotology)   Schatzki's ring    grade 1 varices   Wears dentures    upper   Wears glasses     Past Surgical History:  Procedure Laterality Date   CARDIOVERSION N/A 06/07/2020   Procedure: CARDIOVERSION;  Surgeon: Wellington Hampshire, MD;  Location: [facility] ORS;  Service: Cardiovascular;  Laterality: N/A;   CARDIOVERSION N/A 10/03/2021   Procedure: CARDIOVERSION;  Surgeon: Wellington Hampshire, MD;  Location: [facility] ORS;  Service: Cardiovascular;  Laterality: N/A;   CATARACT EXTRACTION W/ INTRAOCULAR LENS  IMPLANT, BILATERAL  2010 ;  2011   COLONOSCOPY  last one 2014  '@[facility]'$    ELECTROPHYSIOLOGIC STUDY N/A 03/28/2016   Procedure: CARDIOVERSION;  Surgeon: Minna Merritts, MD;  Location: [facility] ORS;  Service: Cardiovascular;  Laterality: N/A;   ESOPHAGOGASTRODUODENOSCOPY  last one 03-04-2018  '@[facility]'$    ESOPHAGOGASTRODUODENOSCOPY (EGD) WITH PROPOFOL N/A 08/18/2016   Procedure: ESOPHAGOGASTRODUODENOSCOPY (EGD) WITH PROPOFOL;  Surgeon: Lollie Sails, MD;  Location: [facility] ENDOSCOPY;  Service: Endoscopy;  Laterality: N/A;   GREEN LIGHT LASER TURP (TRANSURETHRAL RESECTION OF PROSTATE N/A 08/17/2015   Procedure: GREEN LIGHT LASER TURP (TRANSURETHRAL RESECTION OF PROSTATE;  Surgeon: Royston Cowper, MD;  Location: [facility] ORS;  Service: Urology;  Laterality: N/A;   NM MYOVIEW LTD     PATELLA FRACTURE SURGERY  1984   pin placed   TONSILLECTOMY  1949   TRANSURETHRAL RESECTION OF BLADDER NECK N/A 07/12/2020   Procedure: TRANSURETHRAL RESECTION OF BLADDER  NECK;  Surgeon: Franchot Gallo, MD;  Location: [facility];  Service: Urology;  Laterality: N/A;   UPPER ESOPHAGEAL ENDOSCOPIC ULTRASOUND (EUS) N/A 09/21/2016   Procedure: UPPER ESOPHAGEAL ENDOSCOPIC ULTRASOUND (EUS);  Surgeon: Reita Cliche, MD;  Location: [facility] ENDOSCOPY;  Service: Gastroenterology;  Laterality: N/A;    Current Medications: Current Meds  Medication Sig   Diclofenac Sodium 3 % GEL Apply 1 application.  topically as needed (pain).   diltiazem (CARDIZEM CD) 360 MG 24 hr capsule Take 1 capsule (360 mg total) by mouth daily.   docusate sodium (COLACE) 100 MG capsule Take 100 mg by mouth 2 (two) times daily as needed (constipation).   doxycycline (VIBRAMYCIN) 50 MG capsule TAKE 1 CAPSULE BY MOUTH DAILY WITH FOOD AND PLENTY OF FLUID   ELIQUIS 5 MG TABS tablet TAKE 1 TABLET BY MOUTH TWICE A DAY   esomeprazole (NEXIUM) 20 MG capsule Take 20 mg by mouth daily before breakfast.   furosemide (LASIX) 20 MG tablet TAKE 1 TABLET BY MOUTH EVERY DAY   losartan (COZAAR) 50 MG tablet TAKE 1 TABLET BY MOUTH EVERY DAY   metroNIDAZOLE (METROGEL) 1 % gel Apply to face at night   Multiple Vitamins-Minerals (PRESERVISION AREDS PO) Take 1 tablet by mouth in the morning and at bedtime.   mupirocin ointment (BACTROBAN) 2 % Apply 1 application. topically as needed (rash).   Omega-3 Fatty Acids (FISH OIL) 1000 MG CAPS Take 1,000 mg by mouth in the morning and at bedtime.   permethrin (ELIMITE) 5 % cream Apply 1 application. topically 2 (two) times daily. Applied to cheeks   Polyethyl Glycol-Propyl Glycol (SYSTANE OP) Place 1 drop into both eyes 2 (two) times daily as needed (scheduled in the morning & in the evening if needed for dry/irritated eyes.).   pravastatin (PRAVACHOL) 40 MG tablet TAKE ONE TABLET BY MOUTH EVERY DAY   tamsulosin (FLOMAX) 0.4 MG CAPS capsule Take 1 capsule (0.4 mg total) by mouth daily after supper.   vitamin E 180 MG (400 UNITS) capsule Take 400 Units by mouth  in the morning.    Allergies:   Codeine   Social History   Socioeconomic History   Marital status: Married    Spouse name: Katharine Look   Number of children: 4   Years of education: Not on file   Highest education level: Not on file  Occupational History   Occupation: retired Teacher, music: retired  Tobacco Use   Smoking status: Former    Packs/day: 1.00    Years: 3.00    Total pack years: 3.00    Types: Cigarettes    Quit date: 03/28/1959    Years since quitting: 62.8   Smokeless tobacco: Former    Types: Chew    Quit date: 07/06/1968  Vaping Use   Vaping Use: Never used  Substance and Sexual Activity   Alcohol use: No   Drug use: Never   Sexual activity: Not on file  Other Topics Concern   Not on file  Social History Narrative   Has living will   Wife is [place] health care POA (then oldest son, Sherren Mocha)   Not sure about DNR---will accept resuscitation for now   Probably would not want a feeding tube   Social Determinants of Health   Financial Resource Strain: Not on file  Food Insecurity: Not on file  Transportation Needs: Not on file  Physical Activity: Not on file  Stress: Not on file  Social Connections: Not on file     Family History:  The patient's family history includes Alzheimer's disease in his mother; Cancer in his mother; Diabetes in his brother; Heart disease in his brother; Lung cancer in his brother.  ROS:   12-point review of systems is negative unless otherwise noted in the HPI.   EKGs/Labs/Other Studies Reviewed:    Studies reviewed were summarized above. The additional studies were reviewed today:  Lexiscan MPI 07/19/2020: There was no ST segment deviation noted during stress. The study is normal. The left ventricular ejection fraction is normal (55-65%). This is a low risk study. CT attenuation images showed mild coronary and aortic calcifications. __________   2D echo 05/27/2020: 1. Left ventricular ejection fraction, by estimation,  is 60 to 65%. The  left ventricle has normal function. The left ventricle has no regional  wall motion abnormalities. There is moderate left ventricular hypertrophy.  Left ventricular diastolic  parameters are indeterminate.   2. Right ventricular systolic function is normal. The right ventricular  size is normal. There is mildly elevated pulmonary artery systolic  pressure.   3. Left atrial size was moderately dilated.   4. The mitral valve is normal in structure. Mild mitral valve  Regurgitation. __________   Carlton Adam MPI 03/16/2016: There was no ST segment deviation noted during stress. The study is normal. This is a low risk study. The left ventricular ejection fraction is normal (55-65%). __________   2D echo 03/09/2016: - Left ventricle: The cavity size was normal. There was moderate    concentric hypertrophy. Systolic function was normal. The    estimated ejection fraction was in the range of 55% to 60%. Wall    motion was normal; there were no regional wall motion    abnormalities.  - Aortic valve: There was trivial regurgitation.  - Mitral valve: Systolic bowing without prolapse. There was    moderate regurgitation.  - Left atrium: The atrium was mildly dilated.  - Right atrium: The atrium was mildly dilated.  - Tricuspid valve: There was moderate regurgitation.  - Pulmonary arteries: Systolic pressure was moderately increased.    PA peak pressure: 50 mm Hg (S).   EKG:  EKG is ordered today.  The EKG ordered today demonstrates A-fib, 81 bpm, nonspecific ST-T changes  Recent Labs: 08/17/2021: ALT 18; TSH 2.501 02/03/2022: BUN 28; Creatinine, Ser 1.22; Hemoglobin 13.3; Platelets 178; Potassium 4.2; Sodium 138  Recent Lipid Panel    Component Value Date/Time   CHOL 181 11/22/2010 1012   TRIG 82.0 11/22/2010 1012   HDL 54.40 11/22/2010 1012   CHOLHDL 3 11/22/2010 1012   VLDL 16.4 11/22/2010 1012   LDLCALC 110 (H) 11/22/2010 1012   LDLDIRECT 126.2 05/01/2007 0954     PHYSICAL EXAM:    VS:  BP 130/70 (BP Location: Left Arm, Patient Position: Sitting, Cuff Size: Normal)   Pulse 81   Ht '5\' 7"'$  (1.702 m)   Wt 212 lb 8 oz (96.4 kg)   SpO2 98%   BMI 33.28 kg/m   BMI: Body mass index is 33.28 kg/m.  Physical Exam Vitals reviewed.  Constitutional:      Appearance: He is well-developed.  HENT:     Head: Normocephalic and atraumatic.  Eyes:     General:        Right eye: No discharge.        Left eye: No discharge.  Neck:     Vascular: No JVD.  Cardiovascular:     Rate and Rhythm: Normal rate. Rhythm irregularly irregular.     Heart sounds: Normal heart sounds, S1 normal and S2 normal. Heart sounds not distant. No midsystolic click and no opening snap. No murmur heard.    No friction rub.  Pulmonary:     Effort: Pulmonary effort is normal. No respiratory distress.     Breath sounds: Normal breath sounds. No decreased breath sounds, wheezing or rales.  Chest:  Chest wall: No tenderness.  Abdominal:     General: There is no distension.  Musculoskeletal:     Cervical back: Normal range of motion.     Right lower leg: Edema present.     Left lower leg: Edema present.     Comments: Mild bilateral lower extremity only consistent with baseline.  Skin:    General: Skin is warm and dry.     Nails: There is no clubbing.  Neurological:     Mental Status: He is alert and oriented to person, place, and time.  Psychiatric:        Speech: Speech normal.        Behavior: Behavior normal.        Thought Content: Thought content normal.        Judgment: Judgment normal.     Wt Readings from Last 3 Encounters:  02/03/22 212 lb 8 oz (96.4 kg)  12/14/21 219 lb (99.3 kg)  10/18/21 213 lb (96.6 kg)     ASSESSMENT & PLAN:   Longstanding persistent A-fib: He remains in A-fib with controlled ventricular response on Cardizem CD.  He remains very interested in pursuing a rhythm control strategy and has been evaluated by EP with recommendation to  have amiodarone level drawn in December with plans for Tikosyn loading thereafter.  Patient is uncertain at this point if he would like to move forward with Tikosyn versus AV nodal ablation with PPM implantation.  I have advised him to discuss this further with EP following amiodarone level next month.  CHA2DS2-VASc at least 5.  He remains on apixaban 5 mg twice daily and does not meet reduced dosing criteria.  No falls or symptoms concerning for bleeding.  Check CBC and BMP.  HFpEF/pulmonary hypertension: He appears euvolemic and well compensated.  His weight is down 7 pounds when compared to his visit in 11/2021.  He remains on low-dose furosemide.  Check BMP.  HTN: Blood pressure is well controlled in the office today.  Continue current therapy including diltiazem and losartan.  HLD: LDL 90 in 08/2021.  He remains on pravastatin.  Followed by PCP.  Anemia: Mild.  No symptoms of melena or hematochezia.  Check CBC.     Disposition: F/u with Dr. Fletcher Anon or an APP in 3 months and EP as directed following amiodarone level next month.   Medication Adjustments/Labs and Tests Ordered: Current medicines are reviewed at length with the patient today.  Concerns regarding medicines are outlined above. Medication changes, Labs and Tests ordered today are summarized above and listed in the Patient Instructions accessible in Encounters.   Signed, [name], PA-C 02/03/2022 4:31 PM     [facility] [address] [place] [address] [place], [place] [address] [phone]

## 2022-02-03 ENCOUNTER — Encounter: Payer: Self-pay | Admitting: Physician Assistant

## 2022-02-03 ENCOUNTER — Other Ambulatory Visit
Admission: RE | Admit: 2022-02-03 | Discharge: 2022-02-03 | Disposition: A | Discharge status: Home / Self Care | Payer: Medicare HMO | Source: Ambulatory Visit | Attending: Physician Assistant | Admitting: Physician Assistant

## 2022-02-03 ENCOUNTER — Ambulatory Visit: Payer: Medicare HMO | Attending: Physician Assistant | Admitting: Physician Assistant

## 2022-02-03 VITALS — BP 130/70 | HR 81 | Ht 67.0 in | Wt 212.5 lb

## 2022-02-03 DIAGNOSIS — E785 Hyperlipidemia, unspecified: Secondary | ICD-10-CM

## 2022-02-03 DIAGNOSIS — I5032 Chronic diastolic (congestive) heart failure: Secondary | ICD-10-CM | POA: Diagnosis present

## 2022-02-03 DIAGNOSIS — R0609 Other forms of dyspnea: Secondary | ICD-10-CM | POA: Insufficient documentation

## 2022-02-03 DIAGNOSIS — I4811 Longstanding persistent atrial fibrillation: Secondary | ICD-10-CM | POA: Insufficient documentation

## 2022-02-03 DIAGNOSIS — I10 Essential (primary) hypertension: Secondary | ICD-10-CM | POA: Diagnosis not present

## 2022-02-03 DIAGNOSIS — I700 Atherosclerosis of aorta: Secondary | ICD-10-CM | POA: Insufficient documentation

## 2022-02-03 DIAGNOSIS — D649 Anemia, unspecified: Secondary | ICD-10-CM

## 2022-02-03 LAB — BASIC METABOLIC PANEL
Anion gap: 8 (ref 5–15)
BUN: 28 mg/dL — ABNORMAL HIGH (ref 8–23)
CO2: 26 mmol/L (ref 22–32)
Calcium: 9.4 mg/dL (ref 8.9–10.3)
Chloride: 104 mmol/L (ref 98–111)
Creatinine, Ser: 1.22 mg/dL (ref 0.61–1.24)
GFR, Estimated: 58 mL/min — ABNORMAL LOW (ref >60)
Glucose, Bld: 119 mg/dL — ABNORMAL HIGH (ref 70–99)
Potassium: 4.2 mmol/L (ref 3.5–5.1)
Sodium: 138 mmol/L (ref 135–145)

## 2022-02-03 LAB — CBC
HCT: 40.7 % (ref 39.0–52.0)
Hemoglobin: 13.3 g/dL (ref 13.0–17.0)
MCH: 28.2 pg (ref 26.0–34.0)
MCHC: 32.7 g/dL (ref 30.0–36.0)
MCV: 86.2 fL (ref 80.0–100.0)
Platelets: 178 10*3/uL (ref 150–400)
RBC: 4.72 MIL/uL (ref 4.22–5.81)
RDW: 15.5 % (ref 11.5–15.5)
WBC: 4.4 10*3/uL (ref 4.0–10.5)
nRBC: 0 % (ref 0.0–0.2)

## 2022-02-03 NOTE — Patient Instructions (Signed)
Medication Instructions:  No changes at this time.   *If you need a refill on your cardiac medications before your next appointment, please call your pharmacy*   Lab Work: [place] today over at the [facility]. Stop at registration  If you have labs (blood work) drawn today and your tests are completely normal, you will receive your results only by: MyChart Message (if you have MyChart) OR A paper copy in the mail If you have any lab test that is abnormal or we need to change your treatment, we will call you to review the results.   Testing/Procedures: None   Follow-Up: At [facility], you and your health needs are our priority.  As part of our continuing mission to provide you with exceptional heart care, we have created designated Provider Care Teams.  These Care Teams include your primary Cardiologist (physician) and Advanced Practice Providers (APPs -  Physician Assistants and Nurse Practitioners) who all work together to provide you with the care you need, when you need it.   Your next appointment:   3 month(s)  The format for your next appointment:   In Person  Provider:   [name], MD or [name], PA-C        Important Information About Sugar

## 2022-02-11 ENCOUNTER — Other Ambulatory Visit: Payer: Self-pay | Admitting: Cardiovascular Disease

## 2022-03-09 ENCOUNTER — Other Ambulatory Visit: Payer: Self-pay | Admitting: Cardiovascular Disease

## 2022-03-09 ENCOUNTER — Other Ambulatory Visit
Admission: RE | Admit: 2022-03-09 | Discharge: 2022-03-09 | Disposition: A | Discharge status: Home / Self Care | Payer: Medicare HMO | Source: Ambulatory Visit | Attending: Cardiology | Admitting: Cardiology

## 2022-03-09 DIAGNOSIS — I700 Atherosclerosis of aorta: Secondary | ICD-10-CM | POA: Insufficient documentation

## 2022-03-09 DIAGNOSIS — I5032 Chronic diastolic (congestive) heart failure: Secondary | ICD-10-CM | POA: Diagnosis present

## 2022-03-09 DIAGNOSIS — I4811 Longstanding persistent atrial fibrillation: Secondary | ICD-10-CM | POA: Insufficient documentation

## 2022-03-09 NOTE — Telephone Encounter (Signed)
Please review for refill.  

## 2022-03-09 NOTE — Telephone Encounter (Signed)
Prescription refill request for Eliquis received. Indication: AF Last office visit: 02/03/22  R Dunn PA-C Scr: 0.9 on 02/28/22 Age: 86 Weight: 96.4  Based on above findings Eliquis '5mg'$  tice daily is the appropriate dose.  Refill approved.

## 2022-03-14 LAB — AMIODARONE LEVEL
Amiodarone Lvl: <100 ng/mL — ABNORMAL LOW (ref 1000–2500)
N-Desethyl-Amiodarone: <100 ng/mL

## 2022-03-16 ENCOUNTER — Telehealth: Payer: Self-pay | Admitting: Pharmacist

## 2022-03-16 NOTE — Telephone Encounter (Signed)
Medication list reviewed in anticipation of upcoming Tikosyn initiation. Patient is not taking any contraindicated or QTc prolonging medications. Amiodarone level < 0.49mg/mL on 03/09/22, acceptable for Tikosyn start.  Patient is anticoagulated on Eliquis '5mg'$  BID on the appropriate dose. Please ensure that patient has not missed any anticoagulation doses in the 3 weeks prior to Tikosyn initiation.   Patient will need to be counseled to avoid use of Benadryl while on Tikosyn and in the 2-3 days prior to Tikosyn initiation.

## 2022-03-28 ENCOUNTER — Encounter (HOSPITAL_COMMUNITY): Payer: Self-pay

## 2022-03-30 ENCOUNTER — Telehealth (HOSPITAL_COMMUNITY): Payer: Self-pay | Admitting: *Deleted

## 2022-03-30 NOTE — Telephone Encounter (Signed)
Amiodarone level undetectable.   Marzetta Board, can you contact the patient to schedule dofetilide load admission.   Thanks!   Lysbeth Galas T. Quentin Ore, MD, [facility], [facility] Cardiac Electrophysiology   Called patient and scheduled admission - pt then called back the next day and stated he has decided not proceed with Tikosyn loading at this time. He has follow up with [name] & Dr. Quentin Ore in Feb/Mar.

## 2022-04-18 ENCOUNTER — Ambulatory Visit (HOSPITAL_COMMUNITY): Payer: Medicare HMO | Admitting: Nurse Practitioner

## 2022-04-27 ENCOUNTER — Ambulatory Visit: Payer: Medicare HMO | Admitting: Dermatology

## 2022-04-27 VITALS — BP 145/86 | HR 90

## 2022-04-27 DIAGNOSIS — L820 Inflamed seborrheic keratosis: Secondary | ICD-10-CM | POA: Diagnosis not present

## 2022-04-27 DIAGNOSIS — L570 Actinic keratosis: Secondary | ICD-10-CM | POA: Diagnosis not present

## 2022-04-27 DIAGNOSIS — L821 Other seborrheic keratosis: Secondary | ICD-10-CM

## 2022-04-27 DIAGNOSIS — D692 Other nonthrombocytopenic purpura: Secondary | ICD-10-CM | POA: Diagnosis not present

## 2022-04-27 DIAGNOSIS — L578 Other skin changes due to chronic exposure to nonionizing radiation: Secondary | ICD-10-CM | POA: Diagnosis not present

## 2022-04-27 NOTE — Progress Notes (Signed)
   Follow-Up Visit   Subjective  Zachary Green is a 87 y.o. male who presents for the following: Actinic Keratosis (Face, ears, 88mf/u), ISKs (Face, scalp, back, 87m/u), and check spots (Back, waistline, irritating). The patient presents for Upper Body Skin Exam (UBSE) for skin cancer screening and mole check.  The patient has spots, moles and lesions to be evaluated, some may be new or changing and the patient has concerns that these could be cancer.  The following portions of the chart were reviewed this encounter and updated as appropriate:   Tobacco  Allergies  Meds  Problems  Med Hx  Surg Hx  Fam Hx     Review of Systems:  No other skin or systemic complaints except as noted in HPI or Assessment and Plan.  Objective  Well appearing patient in no apparent distress; mood and affect are within normal limits.  A focused examination was performed including face, ears, scalp, back, chest, arms. Relevant physical exam findings are noted in the Assessment and Plan.  back x 15 (15) Stuck on waxy paps with erythema  Scalp, face, ears x 16 (16) Pink scaly macules   Assessment & Plan   Actinic Damage - chronic, secondary to cumulative UV radiation exposure/sun exposure over time - diffuse scaly erythematous macules with underlying dyspigmentation - Recommend daily broad spectrum sunscreen SPF 30+ to sun-exposed areas, reapply every 2 hours as needed.  - Recommend staying in the shade or wearing long sleeves, sun glasses (UVA+UVB protection) and wide brim hats (4-inch brim around the entire circumference of the hat). - Call for new or changing lesions.   Inflamed seborrheic keratosis (15) back x 15 Symptomatic, irritating, patient would like treated. Destruction of lesion - back x 15 Complexity: simple   Destruction method: cryotherapy   Informed consent: discussed and consent obtained   Timeout:  patient name, date of birth, surgical site, and procedure verified Lesion  destroyed using liquid nitrogen: Yes   Region frozen until ice ball extended beyond lesion: Yes   Outcome: patient tolerated procedure well with no complications   Post-procedure details: wound care instructions given    AK (actinic keratosis) (16) Scalp, face, ears x 16 Destruction of lesion - Scalp, face, ears x 16 Complexity: simple   Destruction method: cryotherapy   Informed consent: discussed and consent obtained   Timeout:  patient name, date of birth, surgical site, and procedure verified Lesion destroyed using liquid nitrogen: Yes   Region frozen until ice ball extended beyond lesion: Yes   Outcome: patient tolerated procedure well with no complications   Post-procedure details: wound care instructions given    Seborrheic Keratoses - Stuck-on, waxy, tan-brown papules and/or plaques  - Benign-appearing - Discussed benign etiology and prognosis. - Observe - Call for any changes  Purpura - Chronic; persistent and recurrent.  Treatable, but not curable. - Violaceous macules and patches - Benign - Related to trauma, age, sun damage and/or use of blood thinners, chronic use of topical and/or oral steroids - Observe - Can use OTC arnica containing moisturizer such as Dermend Bruise Formula if desired - Call for worsening or other concerns   Return in about 2 months (around 06/26/2022) for ISK f/u back, AKs face, scalp, ears.  I, SoOthelia PullingRMA, am acting as scribe for DaSarina SerMD . Documentation: I have reviewed the above documentation for accuracy and completeness, and I agree with the above.  DaSarina SerMD

## 2022-04-27 NOTE — Patient Instructions (Addendum)
Cryotherapy Aftercare  Wash gently with soap and water everyday.   Apply Vaseline and Band-Aid daily until healed.     Due to recent changes in healthcare laws, you may see results of your pathology and/or laboratory studies on MyChart before the doctors have had a chance to review them. We understand that in some cases there may be results that are confusing or concerning to you. Please understand that not all results are received at the same time and often the doctors may need to interpret multiple results in order to provide you with the best plan of care or course of treatment. Therefore, we ask that you please give us 2 business days to thoroughly review all your results before contacting the office for clarification. Should we see a critical lab result, you will be contacted sooner.   If You Need Anything After Your Visit  If you have any questions or concerns for your doctor, please call our main line at [phone] and press option 4 to reach your doctor's medical assistant. If no one answers, please leave a voicemail as directed and we will return your call as soon as possible. Messages left after 4 pm will be answered the following business day.   You may also send us a message via MyChart. We typically respond to MyChart messages within 1-2 business days.  For prescription refills, please ask your pharmacy to contact our office. Our fax number is [phone].  If you have an urgent issue when the clinic is closed that cannot wait until the next business day, you can page your doctor at the number below.    Please note that while we do our best to be available for urgent issues outside of office hours, we are not available 24/7.   If you have an urgent issue and are unable to reach us, you may choose to seek medical care at your doctor's office, retail clinic, urgent care center, or emergency room.  If you have a medical emergency, please immediately call 911 or go to the  emergency department.  Pager Numbers  - Dr. Kowalski: [phone]  - Dr. Moye: [phone]  - Dr. Stewart: [phone]  In the event of inclement weather, please call our main line at [phone] for an update on the status of any delays or closures.  Dermatology Medication Tips: Please keep the boxes that topical medications come in in order to help keep track of the instructions about where and how to use these. Pharmacies typically print the medication instructions only on the boxes and not directly on the medication tubes.   If your medication is too expensive, please contact our office at [phone] option 4 or send us a message through MyChart.   We are unable to tell what your co-pay for medications will be in advance as this is different depending on your insurance coverage. However, we may be able to find a substitute medication at lower cost or fill out paperwork to get insurance to cover a needed medication.   If a prior authorization is required to get your medication covered by your insurance company, please allow us 1-2 business days to complete this process.  Drug prices often vary depending on where the prescription is filled and some pharmacies may offer cheaper prices.  The website www.goodrx.com contains coupons for medications through different pharmacies. The prices here do not account for what the cost may be with help from insurance (it may be cheaper with your insurance), but the website can   give you the price if you did not use any insurance.  - You can print the associated coupon and take it with your prescription to the pharmacy.  - You may also stop by our office during regular business hours and pick up a GoodRx coupon card.  - If you need your prescription sent electronically to a different pharmacy, notify our office through [facility] MyChart or by phone at [phone] option 4.     Si Usted Necesita Algo Despus de Su Visita  Tambin puede  enviarnos un mensaje a travs de MyChart. Por lo general respondemos a los mensajes de MyChart en el transcurso de 1 a 2 das hbiles.  Para renovar recetas, por favor pida a su farmacia que se ponga en contacto con nuestra oficina. Nuestro nmero de fax es el [phone].  Si tiene un asunto urgente cuando la clnica est cerrada y que no puede esperar hasta el siguiente da hbil, puede llamar/localizar a su doctor(a) al nmero que aparece a continuacin.   Por favor, tenga en cuenta que aunque hacemos todo lo posible para estar disponibles para asuntos urgentes fuera del horario de oficina, no estamos disponibles las 24 horas del da, los 7 das de la semana.   Si tiene un problema urgente y no puede comunicarse con nosotros, puede optar por buscar atencin mdica  en el consultorio de su doctor(a), en una clnica privada, en un centro de atencin urgente o en una sala de emergencias.  Si tiene una emergencia mdica, por favor llame inmediatamente al 911 o vaya a la sala de emergencias.  Nmeros de bper  - Dr. Kowalski: [phone]  - Dra. Moye: [phone]  - Dra. Stewart: [phone]  En caso de inclemencias del tiempo, por favor llame a nuestra lnea principal al [phone] para una actualizacin sobre el estado de cualquier retraso o cierre.  Consejos para la medicacin en dermatologa: Por favor, guarde las cajas en las que vienen los medicamentos de uso tpico para ayudarle a seguir las instrucciones sobre dnde y cmo usarlos. Las farmacias generalmente imprimen las instrucciones del medicamento slo en las cajas y no directamente en los tubos del medicamento.   Si su medicamento es muy caro, por favor, pngase en contacto con nuestra oficina llamando al [phone] y presione la opcin 4 o envenos un mensaje a travs de MyChart.   No podemos decirle cul ser su copago por los medicamentos por adelantado ya que esto es diferente dependiendo de la cobertura de su seguro.  Sin embargo, es posible que podamos encontrar un medicamento sustituto a menor costo o llenar un formulario para que el seguro cubra el medicamento que se considera necesario.   Si se requiere una autorizacin previa para que su compaa de seguros cubra su medicamento, por favor permtanos de 1 a 2 das hbiles para completar este proceso.  Los precios de los medicamentos varan con frecuencia dependiendo del lugar de dnde se surte la receta y alguna farmacias pueden ofrecer precios ms baratos.  El sitio web www.goodrx.com tiene cupones para medicamentos de diferentes farmacias. Los precios aqu no tienen en cuenta lo que podra costar con la ayuda del seguro (puede ser ms barato con su seguro), pero el sitio web puede darle el precio si no utiliz ningn seguro.  - Puede imprimir el cupn correspondiente y llevarlo con su receta a la farmacia.  - Tambin puede pasar por nuestra oficina durante el horario de atencin regular y recoger una tarjeta de cupones de GoodRx.  -   Si necesita que su receta se enve electrnicamente a una farmacia diferente, informe a nuestra oficina a travs de MyChart de [place] o por telfono llamando al [phone] y presione la opcin 4.  

## 2022-05-01 ENCOUNTER — Encounter: Payer: Self-pay | Admitting: Dermatology

## 2022-05-04 ENCOUNTER — Encounter (HOSPITAL_COMMUNITY): Payer: Self-pay | Admitting: *Deleted

## 2022-05-04 NOTE — Progress Notes (Deleted)
Cardiology Office Note    Date:  05/04/2022   ID:  Zachary Green, DOB 1935-05-25, MRN [id number]  PCP:  Zachary Body, MD  Cardiologist:  Zachary Sacramento, MD  Electrophysiologist:  Zachary Epley, MD   Chief Complaint: ***  History of Present Illness:   Zachary Green is a 87 y.o. male with history of ***  ***   Labs independently reviewed: 02/2022 - Hgb 13.3, PLT 190, potassium 4.5, BUN 12, serum creatinine 0.9, albumin 4.3, AST/ALT normal, A1c 6.0, TC 185, TG 85, HDL 61, LDL 102 07/2021 - TSH normal  Past Medical History:  Diagnosis Date   Actinic keratosis    Arthritis    Bladder neck contracture    BPH associated with nocturia    Chronic diastolic CHF (congestive heart failure) [facility]) cardiologist--- dr Zachary Green   a. echo 12/17: 55-60%, no RWMA, trivial AI, moderate MR with systolic bowing without prolapse, moderate TR, mild biatrial enlargement, PASP 50 mmHg   Diverticulosis of colon    DOE (dyspnea on exertion)    07-06-2020  per pt sob with chores around the house, with yard work, stairs, and long distances gets sob   ED (erectile dysfunction)    GERD (gastroesophageal reflux disease)    History of basal cell carcinoma (BCC) excision    excision's ;   09/ 2015 left upper lateral chest;  09/ 2016  right lateral top shoulder;  10/ 2017 right anterior clavical area    History of diverticulitis of colon    History of gastric polyp    followed by dr Zachary Green. Gu[place]lupe Spanish (gi)---  previous adenomatous low grade   History of urinary retention    Hyperlipidemia    Hypertension    followed by pcp and cardiology   Light-headed feeling    07-06-2020  per pt when he goes in and out of afib get light headed   Macular degeneration, right eye    Multiple pulmonary nodules    followed by pcp   Persistent atrial fibrillation [facility]) followed by cardiology--- dr a. Rogue Green -- first dx 12/ 2017   a. on Eliquis, amiodarone, Toprol; b. s/p successful dccv 03/28/16  and 06-07-2020;  c. CHADS2VASc at least 3 (HTN, age x 2)   Personal history of colonic adenoma 05/23/2007   04/2007 - 6 mm adenoma   Prostate cancer [facility]) urologist--- dr Zachary Green   dx 2017 ,  post HIFU ablative therapy 07/ 2017 and completed external radiation 04/ 2019   Rosacea    followed by dr d. Zachary Green (dermotology)   Schatzki's ring    grade 1 varices   Wears dentures    upper   Wears glasses     Past Surgical History:  Procedure Laterality Date   CARDIOVERSION N/A 06/07/2020   Procedure: CARDIOVERSION;  Surgeon: Zachary Hampshire, MD;  Location: [facility] ORS;  Service: Cardiovascular;  Laterality: N/A;   CARDIOVERSION N/A 10/03/2021   Procedure: CARDIOVERSION;  Surgeon: Zachary Hampshire, MD;  Location: [place] [facility]S;  Service: Cardiovascular;  Laterality: N/A;   CATARACT EXTRACTION W/ INTRAOCULAR LENS  IMPLANT, BILATERAL  2010 ;  2011   COLONOSCOPY  last one 2014  '@[facility]'$    ELECTROPHYSIOLOGIC STUDY N/A 03/28/2016   Procedure: CARDIOVERSION;  Surgeon: Zachary Merritts, MD;  Location: [facility] ORS;  Service: Cardiovascular;  Laterality: N/A;   ESOPHAGOGASTRODUODENOSCOPY  last one 03-04-2018  '@[facility]'$    ESOPHAGOGASTRODUODENOSCOPY (EGD) WITH PROPOFOL N/A 08/18/2016   Procedure: ESOPHAGOGASTRODUODENOSCOPY (EGD) WITH PROPOFOL;  Surgeon: Zachary Green  U, MD;  Location: [facility] ENDOSCOPY;  Service: Endoscopy;  Laterality: N/A;   GREEN LIGHT LASER TURP (TRANSURETHRAL RESECTION OF PROSTATE N/A 08/17/2015   Procedure: GREEN LIGHT LASER TURP (TRANSURETHRAL RESECTION OF PROSTATE;  Surgeon: Zachary Cowper, MD;  Location: [facility] ORS;  Service: Urology;  Laterality: N/A;   NM MYOVIEW LTD     PATELLA FRACTURE SURGERY  1984   pin placed   TONSILLECTOMY  1949   TRANSURETHRAL RESECTION OF BLADDER NECK N/A 07/12/2020   Procedure: TRANSURETHRAL RESECTION OF BLADDER NECK;  Surgeon: Zachary Gallo, MD;  Location: [facility];  Service: Urology;  Laterality: N/A;   UPPER ESOPHAGEAL ENDOSCOPIC ULTRASOUND (EUS) N/A  09/21/2016   Procedure: UPPER ESOPHAGEAL ENDOSCOPIC ULTRASOUND (EUS);  Surgeon: Zachary Cliche, MD;  Location: [facility] ENDOSCOPY;  Service: Gastroenterology;  Laterality: N/A;    Current Medications: No outpatient medications have been marked as taking for the 05/08/22 encounter (Appointment) with Rise Mu, PA-C.    Allergies:   Codeine   Social History   Socioeconomic History   Marital status: Married    Spouse name: Zachary Green   Number of children: 4   Years of education: Not on file   Highest education level: Not on file  Occupational History   Occupation: retired Teacher, music: retired  Tobacco Use   Smoking status: Former    Packs/day: 1.00    Years: 3.00    Total pack years: 3.00    Types: Cigarettes    Quit date: 03/28/1959    Years since quitting: 63.1   Smokeless tobacco: Former    Types: Chew    Quit date: 07/06/1968  Vaping Use   Vaping Use: Never used  Substance and Sexual Activity   Alcohol use: No   Drug use: Never   Sexual activity: Not on file  Other Topics Concern   Not on file  Social History Narrative   Has living will   Wife is [place] health care POA (then oldest son, Zachary Green)   Not sure about DNR---will accept resuscitation for now   Probably would not want a feeding tube   Social Determinants of Health   Financial Resource Strain: Not on file  Food Insecurity: Not on file  Transportation Needs: Not on file  Physical Activity: Not on file  Stress: Not on file  Social Connections: Not on file     Family History:  The patient's family history includes Alzheimer's disease in his mother; Cancer in his mother; Diabetes in his brother; Heart disease in his brother; Lung cancer in his brother.  ROS:   12-point review of systems is negative unless otherwise noted in HPI.   EKGs/Labs/Other Studies Reviewed:    Studies reviewed were summarized above. The additional studies were reviewed today:  Lexiscan MPI 07/19/2020: There was no ST segment  deviation noted during stress. The study is normal. The left ventricular ejection fraction is normal (55-65%). This is a low risk study. CT attenuation images showed mild coronary and aortic calcifications. __________   2D echo 05/27/2020: 1. Left ventricular ejection fraction, by estimation, is 60 to 65%. The  left ventricle has normal function. The left ventricle has no regional  wall motion abnormalities. There is moderate left ventricular hypertrophy.  Left ventricular diastolic  parameters are indeterminate.   2. Right ventricular systolic function is normal. The right ventricular  size is normal. There is mildly elevated pulmonary artery systolic  pressure.   3. Left atrial size was moderately  dilated.   4. The mitral valve is normal in structure. Mild mitral valve  Regurgitation. __________   Carlton Adam MPI 03/16/2016: There was no ST segment deviation noted during stress. The study is normal. This is a low risk study. The left ventricular ejection fraction is normal (55-65%). __________   2D echo 03/09/2016: - Left ventricle: The cavity size was normal. There was moderate    concentric hypertrophy. Systolic function was normal. The    estimated ejection fraction was in the range of 55% to 60%. Wall    motion was normal; there were no regional wall motion    abnormalities.  - Aortic valve: There was trivial regurgitation.  - Mitral valve: Systolic bowing without prolapse. There was    moderate regurgitation.  - Left atrium: The atrium was mildly dilated.  - Right atrium: The atrium was mildly dilated.  - Tricuspid valve: There was moderate regurgitation.  - Pulmonary arteries: Systolic pressure was moderately increased.    PA peak pressure: 50 mm Hg (S).   EKG:  EKG is ordered today.  The EKG ordered today demonstrates ***  Recent Labs: 08/17/2021: ALT 18; TSH 2.501 02/03/2022: BUN 28; Creatinine, Ser 1.22; Hemoglobin 13.3; Platelets 178; Potassium 4.2; Sodium 138   Recent Lipid Panel    Component Value Date/Time   CHOL 181 11/22/2010 1012   TRIG 82.0 11/22/2010 1012   HDL 54.40 11/22/2010 1012   CHOLHDL 3 11/22/2010 1012   VLDL 16.4 11/22/2010 1012   LDLCALC 110 (H) 11/22/2010 1012   LDLDIRECT 126.2 05/01/2007 0954    PHYSICAL EXAM:    VS:  There were no vitals taken for this visit.  BMI: There is no height or weight on file to calculate BMI.  Physical Exam  Wt Readings from Last 3 Encounters:  02/03/22 212 lb 8 oz (96.4 kg)  12/14/21 219 lb (99.3 kg)  10/18/21 213 lb (96.6 kg)     ASSESSMENT & PLAN:   Longstanding persistent A-fib:  HFpEF/pulmonary hypertension:  HTN: Blood pressure  HLD: LDL 90 in 08/2021.  Anemia:   {Are you ordering a CV Procedure (e.g. stress test, cath, DCCV, TEE, etc)?   Press F2        :[id number]}     Disposition: F/u with Dr. Fletcher Anon or an APP in ***.   Medication Adjustments/Labs and Tests Ordered: Current medicines are reviewed at length with the patient today.  Concerns regarding medicines are outlined above. Medication changes, Labs and Tests ordered today are summarized above and listed in the Patient Instructions accessible in Encounters.   Signed, [name], PA-C 05/04/2022 10:33 AM     [place] [address] [place] [address] [place], [place] [address] [phone]

## 2022-05-08 ENCOUNTER — Ambulatory Visit: Payer: Medicare HMO | Admitting: Physician Assistant

## 2022-05-10 ENCOUNTER — Other Ambulatory Visit: Payer: Self-pay | Admitting: Cardiovascular Disease

## 2022-05-11 NOTE — Progress Notes (Unsigned)
Cardiology Office Note    Date:  05/15/2022   ID:  KONOR MONTANTES, DOB 05-14-1935, MRN [id number]  PCP:  [name], MD  Cardiologist:  [name], MD  Electrophysiologist:  Vickie Epley, MD   Chief Complaint: Follow up  History of Present Illness:   Zachary Green is a 87 y.o. male with history of longstanding persistent Afib on Eliquis s/p prior DCCV s/p repeat unsuccessful DCCV 06/07/2020 for recurrent Afib status post repeat DCCV in 09/2021, aortic atherosclerosis, HFpEF/pulmonary hypertension, pulmonary nodules, BPH and prostate cancer s/p radiation therapy, HTN, HLD, prior tobacco use for 5 years in his 42s, and family history notable for Afib who presents for follow up of Afib.    Echo in 2017 showed a normal LVSF, moderate MR/TR, and moderate pulmonary hypertension. He was treated with low dose Lasix with stable symptoms noted in follow up. Nuclear stress test in 2017 was normal.    In follow up in 11/2019, he was maintaining sinus rhythm.   He was seen in 04/2020 with recurrent palpitations, dizziness, fatigue, and increased SOB that dated back to 03/2020. He was noted to be in Afib with ventricular rate of 100 bpm. He was on amiodarone 100 mg daily and was reloaded followed by increased dose of maintenance dose of 200 mg daily. His Toprol was also increased to 25 mg daily. Echo on 05/27/2020 showed an EF of 60-65%, no RWMA, indeterminate LV diastolic function parameters, normal RVSF and ventricular cavity size, mildly elevated PASP at 36.2 mmHg, moderately dilated left atrium at 4.6 cm, and mild mitral regurgitation. When he was seen in follow up in 05/2020, he remained in Afib with controlled ventricular response and continued to note exertional dyspnea.  He underwent briefly successful DCCV on [id number] without complication.  When he was seen in follow-up on 07/01/2020 he was noted to be in atrial flutter.  He noted persistent lightheadedness and fatigue which were largely  unchanged when compared to his prior visits.  His case was discussed with his primary cardiologist, and given he had failed amiodarone this was discontinued.  Toprol-XL was titrated to 37.71m twice daily.  In an effort to prevent significant hypotension losartan was tapered to 50 mg.  He was referred to EP for further management, at the request of his cardiologist.  Upon establishing with EP in 07/2020, it was felt his underlying A. fib was not contributing to the majority of his symptoms with recommendation to pursue rate control.  Main complaint at that time was brain fog and headaches.  He was seen by EP in 10/2020 and continued to report fatigue and underlying brain fogginess.  It was unclear what was leading to this.  Metoprolol was decreased to 25 mg 2 times daily with recommendation for him to follow-up with his PCP.  Of note, MRI of the brain in 06/2020 showed no acute intracranial abnormality.  He was seen in the office on 04/29/2021, and noted a significant improvement in his functional status and quality of life following a steroid hip injection.  He was without symptoms of angina or decompensation.  He did continue to note some cognitive decline and "fogginess."  Toprol was decreased from 25 mg to 12.5 mg twice daily.  He was seen in the office on 06/09/2021 and overall felt similar to how he did at his last visit with stable exertional dyspnea and fatigue, without symptoms of angina or decompensation.  His main complaint at that time continued to be cognitive decline  and mental "fogginess."  He did not notice a significant change in the symptoms following the decreasing of metoprolol.  BP and ventricular rates remained well controlled.  We discontinued metoprolol altogether and underwent a trial of Cardizem CD 120 mg daily.  He was seen in the office on 07/11/2021 and continued to feel the same as he had at his prior visits without any significant change in the way he felt off beta-blocker.  He was tolerating  Cardizem CD.  We underwent a trial of holding his statin to see if this would improve his malaise, fatigue, and mental "fogginess."  He was seen on 08/17/2021 and continued to feel the same, despite holding statin.  It was also noted he had ran out of losartan for a couple of days and also noted no change in his symptoms.  Despite close follow-up and extensive medication tapering/holidays he noted no change in symptoms.  Given this, it was felt further medication holidays/tapering would likely have a low yield and we elected to again pursue rhythm control strategy to see if this helps with his symptoms.  With this, he was initiated on amiodarone with plans for repeat DCCV.  He was seen in the office on 09/20/2021 and continued to feel the same.  He had inadvertently discontinued amiodarone several days prior to his visit with recommendation to resume medication.  He underwent successful DCCV on 10/03/2021.  He was seen in follow-up on 10/18/2021 and noted he did feel some improvement in his functional status with noted less shortness of breath, less lightheadedness, and an overall improvement in his quality of life while he was in documented sinus rhythm.  However, he noted a return of his exertional dyspnea, lightheadedness, and decreased functional status with concerns he had redeveloped A-fib.  He was noted to be back in A-fib at his visit on 7/25.  He remained interested in pursuing rhythm control.  Cardizem CD was titrated and he was referred to EP for further discussion of rhythm control strategy.  He was evaluated by EP on 12/14/2021 with recommendation to washout amiodarone with subsequent Tikosyn initiation there after.  Patient and son preferred to hold off on AV nodal ablation with PPM implantation until he underwent a trial of Tikosyn.  He was last seen in the office in 01/2022 and was without symptoms of angina or decompensation.  He noted largely unchanged dyspnea and fatigue as well as headaches.  He  continued to be interested in rhythm control strategy.  Upon attempting to schedule him for Tikosyn admission, he indicated he did not want to proceed with Tikosyn loading at that time.  He comes in today accompanied by his son and continues to feel the same as he is at his prior visits.  He continues to note intermittent headaches with difficulty concentrating, fatigue, and exertional shortness of breath.  No chest pain or palpitations.  No dizziness, presyncope, or syncope.  With the prior titration of diltiazem, he noted an increase in lower extremity swelling.  With this, PCP titrated his furosemide with stable labs as outlined below.  No falls or symptoms concerning for bleeding.  He remains adherent to apixaban.  He has an appointment with [facility] in 2 weeks for a second opinion regarding his A-fib.  He continues to consider rhythm control strategy, though is hesitant to pursue take his statin admission or ablation.  As previously noted above, he has not been maintained sinus rhythm on amiodarone with subsequent DCCV.   Labs independently reviewed:  02/2022 - Hgb 13.3, PLT 190, potassium 4.5, BUN 12, serum creatinine 0.9, albumin 4.3, AST/ALT normal, A1c 6.0, TC 185, TG 85, HDL 61, LDL 102 07/2021 - TSH normal  Past Medical History:  Diagnosis Date   Actinic keratosis    Arthritis    Bladder neck contracture    BPH associated with nocturia    Chronic diastolic CHF (congestive heart failure) [facility]) cardiologist--- dr a. muhammad   a. echo 12/17: 55-60%, no RWMA, trivial AI, moderate MR with systolic bowing without prolapse, moderate TR, mild biatrial enlargement, PASP 50 mmHg   Diverticulosis of colon    DOE (dyspnea on exertion)    07-06-2020  per pt sob with chores around the house, with yard work, stairs, and long distances gets sob   ED (erectile dysfunction)    GERD (gastroesophageal reflux disease)    History of basal cell carcinoma (BCC) excision    excision's ;   09/  2015 left upper lateral chest;  09/ 2016  right lateral top shoulder;  10/ 2017 right anterior clavical area    History of diverticulitis of colon    History of gastric polyp    followed by dr Jerilynn Mages. [place] Spanish (gi)---  previous adenomatous low grade   History of urinary retention    Hyperlipidemia    Hypertension    followed by pcp and cardiology   Light-headed feeling    07-06-2020  per pt when he goes in and out of afib get light headed   Macular degeneration, right eye    Multiple pulmonary nodules    followed by pcp   Persistent atrial fibrillation [facility]) followed by cardiology--- dr a. Rogue Jury -- first dx 12/ 2017   a. on Eliquis, amiodarone, Toprol; b. s/p successful dccv 03/28/16  and 06-07-2020; c. CHADS2VASc at least 3 (HTN, age x 2)   Personal history of colonic adenoma 05/23/2007   04/2007 - 6 mm adenoma   Prostate cancer [facility]) urologist--- dr Diona Fanti   dx 2017 ,  post HIFU ablative therapy 07/ 2017 and completed external radiation 04/ 2019   Rosacea    followed by dr d. Nehemiah Massed (dermotology)   Schatzki's ring    grade 1 varices   Wears dentures    upper   Wears glasses     Past Surgical History:  Procedure Laterality Date   CARDIOVERSION N/A 06/07/2020   Procedure: CARDIOVERSION;  Surgeon: Wellington Hampshire, MD;  Location: [facility] ORS;  Service: Cardiovascular;  Laterality: N/A;   CARDIOVERSION N/A 10/03/2021   Procedure: CARDIOVERSION;  Surgeon: Wellington Hampshire, MD;  Location: [facility] ORS;  Service: Cardiovascular;  Laterality: N/A;   CATARACT EXTRACTION W/ INTRAOCULAR LENS  IMPLANT, BILATERAL  2010 ;  2011   COLONOSCOPY  last one 2014  @[facility]$    ELECTROPHYSIOLOGIC STUDY N/A 03/28/2016   Procedure: CARDIOVERSION;  Surgeon: Minna Merritts, MD;  Location: [facility] ORS;  Service: Cardiovascular;  Laterality: N/A;   ESOPHAGOGASTRODUODENOSCOPY  last one 03-04-2018  @[facility]    ESOPHAGOGASTRODUODENOSCOPY (EGD) WITH PROPOFOL N/A 08/18/2016   Procedure: ESOPHAGOGASTRODUODENOSCOPY (EGD) WITH  PROPOFOL;  Surgeon: Lollie Sails, MD;  [facility] ENDOSCOPY;  Service: Endoscopy;  Laterality: N/A;   GREEN LIGHT LASER TURP (TRANSURETHRAL RESECTION OF PROSTATE N/A 08/17/2015   Procedure: GREEN LIGHT LASER TURP (TRANSURETHRAL RESECTION OF PROSTATE;  Surgeon: Royston Cowper, MD;  Location: [facility] ORS;  Service: Urology;  Laterality: N/A;   [place]   pin placed  TONSILLECTOMY  1949   TRANSURETHRAL RESECTION OF BLADDER NECK N/A 07/12/2020   Procedure: TRANSURETHRAL RESECTION OF BLADDER NECK;  Surgeon: Franchot Gallo, MD;  Location: [facility];  Service: Urology;  Laterality: N/A;   UPPER ESOPHAGEAL ENDOSCOPIC ULTRASOUND (EUS) N/A 09/21/2016   Procedure: UPPER ESOPHAGEAL ENDOSCOPIC ULTRASOUND (EUS);  Surgeon: Reita Cliche, MD;  Location: [facility] ENDOSCOPY;  Service: Gastroenterology;  Laterality: N/A;    Current Medications: Current Meds  Medication Sig   Diclofenac Sodium 3 % GEL Apply 1 application. topically as needed (pain).   docusate sodium (COLACE) 100 MG capsule Take 100 mg by mouth 2 (two) times daily as needed (constipation).   doxycycline (VIBRAMYCIN) 50 MG capsule TAKE 1 CAPSULE BY MOUTH DAILY WITH FOOD AND PLENTY OF FLUID   ELIQUIS 5 MG TABS tablet TAKE 1 TABLET BY MOUTH TWICE A DAY   esomeprazole (NEXIUM) 20 MG capsule Take 20 mg by mouth daily before breakfast.   furosemide (LASIX) 40 MG tablet Take 40 mg by mouth daily.   losartan (COZAAR) 50 MG tablet TAKE 1 TABLET BY MOUTH EVERY DAY   metoprolol succinate (TOPROL XL) 25 MG 24 hr tablet Take 1 tablet (25 mg total) by mouth daily.   metroNIDAZOLE (METROGEL) 1 % gel Apply to face at night   Multiple Vitamins-Minerals (PRESERVISION AREDS PO) Take 1 tablet by mouth in the morning and at bedtime.   mupirocin ointment (BACTROBAN) 2 % Apply 1 application. topically as needed (rash).   Omega-3 Fatty Acids (FISH OIL) 1000 MG CAPS Take 1,000 mg by mouth in the  morning and at bedtime.   permethrin (ELIMITE) 5 % cream Apply 1 application. topically 2 (two) times daily. Applied to cheeks   Polyethyl Glycol-Propyl Glycol (SYSTANE OP) Place 1 drop into both eyes 2 (two) times daily as needed (scheduled in the morning & in the evening if needed for dry/irritated eyes.).   pravastatin (PRAVACHOL) 40 MG tablet TAKE ONE TABLET BY MOUTH EVERY DAY   tamsulosin (FLOMAX) 0.4 MG CAPS capsule Take 1 capsule (0.4 mg total) by mouth daily after supper.   vitamin E 180 MG (400 UNITS) capsule Take 400 Units by mouth in the morning.   [DISCONTINUED] diltiazem (CARDIZEM CD) 360 MG 24 hr capsule Take 1 capsule (360 mg total) by mouth daily.    Allergies:   Codeine   Social History   Socioeconomic History   Marital status: Married    Spouse name: Katharine Look   Number of children: 4   Years of education: Not on file   Highest education level: Not on file  Occupational History   Occupation: retired Teacher, music: retired  Tobacco Use   Smoking status: Former    Packs/day: 1.00    Years: 3.00    Total pack years: 3.00    Types: Cigarettes    Quit date: 03/28/1959    Years since quitting: 63.1   Smokeless tobacco: Former    Types: Chew    Quit date: 07/06/1968  Vaping Use   Vaping Use: Never used  Substance and Sexual Activity   Alcohol use: No   Drug use: Never   Sexual activity: Not on file  Other Topics Concern   Not on file  Social History Narrative   Has living will   Wife is [place] health care POA (then oldest son, Todd)Not sure about DNR---will accept resuscitation for nowProbably would not want a feeding tube   Social Determinants of Health   Financial  Resource Strain: Not on file  Food Insecurity: Not on file  Transportation Needs: Not on file  Physical Activity: Not on file  Stress: Not on file  Social Connections: Not on file     Family History:  The patient's family history includes Alzheimer's disease in his mother; Cancer in his  mother; Diabetes in his brother; Heart disease in his brother; Lung cancer in his brother.  ROS:   12-point review of systems is negative unless otherwise noted in HPI.   EKGs/Labs/Other Studies Reviewed:    Studies reviewed were summarized above. The additional studies were reviewed today:  Lexiscan MPI 07/19/2020: There was no ST segment deviation noted during stress. The study is normal. The left ventricular ejection fraction is normal (55-65%). This is a low risk study. CT attenuation images showed mild coronary and aortic calcifications. __________   2D echo 05/27/2020: 1. Left ventricular ejection fraction, by estimation, is 60 to 65%. The  left ventricle has normal function. The left ventricle has no regional  wall motion abnormalities. There is moderate left ventricular hypertrophy.  Left ventricular diastolic  parameters are indeterminate.   2. Right ventricular systolic function is normal. The right ventricular  size is normal. There is mildly elevated pulmonary artery systolic  pressure.   3. Left atrial size was moderately dilated.   4. The mitral valve is normal in structure. Mild mitral valve  Regurgitation. __________   Carlton Adam MPI 03/16/2016: There was no ST segment deviation noted during stress. The study is normal. This is a low risk study. The left ventricular ejection fraction is normal (55-65%). __________   2D echo 03/09/2016: - Left ventricle: The cavity size was normal. There was moderate    concentric hypertrophy. Systolic function was normal. The    estimated ejection fraction was in the range of 55% to 60%. Wall    motion was normal; there were no regional wall motion    abnormalities.  - Aortic valve: There was trivial regurgitation.  - Mitral valve: Systolic bowing without prolapse. There was    moderate regurgitation.  - Left atrium: The atrium was mildly dilated.  - Right atrium: The atrium was mildly dilated.  - Tricuspid valve: There  was moderate regurgitation.  - Pulmonary arteries: Systolic pressure was moderately increased.    PA peak pressure: 50 mm Hg (S).   EKG:  EKG is ordered today.  The EKG ordered today demonstrates A-fib, 90 bpm, poor R wave progression along the precordial leads, baseline artifact, nonspecific ST-T changes  Recent Labs: 08/17/2021: ALT 18; TSH 2.501 02/03/2022: BUN 28; Creatinine, Ser 1.22; Hemoglobin 13.3; Platelets 178; Potassium 4.2; Sodium 138  Recent Lipid Panel    Component Value Date/Time   CHOL 181 11/22/2010 1012   TRIG 82.0 11/22/2010 1012   HDL 54.40 11/22/2010 1012   CHOLHDL 3 11/22/2010 1012   VLDL 16.4 11/22/2010 1012   LDLCALC 110 (H) 11/22/2010 1012   LDLDIRECT 126.2 05/01/2007 0954    PHYSICAL EXAM:    VS:  BP (!) 148/80 (BP Location: Left Arm, Patient Position: Sitting, Cuff Size: Normal)   Pulse 98   Ht 5' 7"$  (1.702 m)   Wt 213 lb (96.6 kg)   SpO2 95%   BMI 33.36 kg/m   BMI: Body mass index is 33.36 kg/m.  Physical Exam Constitutional:      Appearance: He is well-developed.  HENT:     Head: Normocephalic and atraumatic.  Eyes:     General:  Right eye: No discharge.        Left eye: No discharge.  Neck:     Vascular: No JVD.  Cardiovascular:     Rate and Rhythm: Normal rate. Rhythm irregularly irregular.     Heart sounds: Normal heart sounds, S1 normal and S2 normal. Heart sounds not distant. No midsystolic click and no opening snap. No murmur heard.    No friction rub.  Pulmonary:     Effort: Pulmonary effort is normal. No respiratory distress.     Breath sounds: Normal breath sounds. No decreased breath sounds, wheezing or rales.  Chest:     Chest wall: No tenderness.  Abdominal:     General: There is no distension.  Musculoskeletal:     Cervical back: Normal range of motion.     Right lower leg: Edema present.     Left lower leg: Edema present.     Comments: Mild bilateral lower extremity pretibial edema with chronic woody  appearance.  Skin:    General: Skin is warm and dry.     Nails: There is no clubbing.  Neurological:     Mental Status: He is alert and oriented to person, place, and time.  Psychiatric:        Speech: Speech normal.        Behavior: Behavior normal.        Thought Content: Thought content normal.        Judgment: Judgment normal.     Wt Readings from Last 3 Encounters:  05/15/22 213 lb (96.6 kg)  02/03/22 212 lb 8 oz (96.4 kg)  12/14/21 219 lb (99.3 kg)     ASSESSMENT & PLAN:   Longstanding persistent A-fib: He remains in A-fib with reasonably controlled ventricular response.  He has been evaluated by EP and felt to actually have permanent A-fib following multiple failed cardioversions including on amiodarone.  However, the patient has continued to express interest in rhythm control strategy.  His options were noted to be limited and he was not felt to be a catheter ablation candidate.  The patient was subsequently scheduled to undergo Tikosyn loading admission in 2023, though canceled this due to his concerns regarding off target effects.  AV nodal junction ablation with permanent pacemaker implantation has also been discussed with the patient by EP, however patient and son preferred to avoid this.  It remains unclear if he actually noted any symptomatic improvement while in previously documented sinus rhythm, as he has previously indicated he felt no different, though later reported improvement in his fatigue following separate cardioversions in sinus rhythm.  At this point, I do not think his headache and "fogginess" cardiac in etiology.  He is a scheduled to be evaluated by [facility] in 2 weeks for a second opinion.  HFpEF/pulmonary hypertension: Euvolemic and well compensated with a stable weight.  He remains on furosemide 40 mg daily with stable renal function.  With his history of prostate cancer, he has concerns regarding frequent voiding, therefore SGLT2 inhibitor has not been  initiated.  Consider addition of MRA and follow-up.  HTN: Blood pressure is mildly elevated in the office this afternoon.  Add Toprol-XL as outlined above.  Today he remains on losartan and a lower dose diltiazem.  HLD: LDL 90 in 08/2021.  Remains on pravastatin.  Followed by PCP.  Anemia: Improved on last check.  No symptoms concerning for bleeding.    Disposition: F/u with Dr. Fletcher Anon or an APP in 6 months and EP as  directed.   Medication Adjustments/Labs and Tests Ordered: Current medicines are reviewed at length with the patient today.  Concerns regarding medicines are outlined above. Medication changes, Labs and Tests ordered today are summarized above and listed in the Patient Instructions accessible in Encounters.   Signed, [name], PA-C 05/15/2022 4:27 PM     [address], [place] [id number] [phone]

## 2022-05-14 ENCOUNTER — Other Ambulatory Visit: Payer: Self-pay | Admitting: Physician Assistant

## 2022-05-15 ENCOUNTER — Ambulatory Visit: Payer: Medicare HMO | Attending: Physician Assistant | Admitting: Physician Assistant

## 2022-05-15 ENCOUNTER — Encounter: Payer: Self-pay | Admitting: Physician Assistant

## 2022-05-15 VITALS — BP 148/80 | HR 98 | Ht 67.0 in | Wt 213.0 lb

## 2022-05-15 DIAGNOSIS — I4811 Longstanding persistent atrial fibrillation: Secondary | ICD-10-CM | POA: Diagnosis not present

## 2022-05-15 DIAGNOSIS — I272 Pulmonary hypertension, unspecified: Secondary | ICD-10-CM | POA: Diagnosis not present

## 2022-05-15 DIAGNOSIS — I10 Essential (primary) hypertension: Secondary | ICD-10-CM | POA: Diagnosis not present

## 2022-05-15 DIAGNOSIS — I5032 Chronic diastolic (congestive) heart failure: Secondary | ICD-10-CM

## 2022-05-15 DIAGNOSIS — D649 Anemia, unspecified: Secondary | ICD-10-CM

## 2022-05-15 DIAGNOSIS — E785 Hyperlipidemia, unspecified: Secondary | ICD-10-CM

## 2022-05-15 MED ORDER — METOPROLOL SUCCINATE ER 25 MG PO TB24: 25.0000 mg | ORAL_TABLET | Freq: Every day | ORAL | 3 refills | Status: AC

## 2022-05-15 MED ORDER — DILTIAZEM HCL ER COATED BEADS 240 MG PO CP24: 240.0000 mg | ORAL_CAPSULE | Freq: Every day | ORAL | 3 refills | Status: AC

## 2022-05-15 NOTE — Patient Instructions (Signed)
Medication Instructions:  Your physician has recommended you make the following change in your medication:   diltiazem (CARDIZEM CD) 240 MG 24 hr capsule - Take 1 capsule (240 mg total) by mouth daily  metoprolol succinate (TOPROL XL) 25 MG 24 hr tablet - Take 1 tablet (25 mg total) by mouth daily  *If you need a refill on your cardiac medications before your next appointment, please call your pharmacy*   Lab Work: - None ordered If you have labs (blood work) drawn today and your tests are completely normal, you will receive your results only by: [place] (if you have MyChart) OR A paper copy in the mail If you have any lab test that is abnormal or we need to change your treatment, we will call you to review the results.   Testing/Procedures: - None ordered  Follow-Up: At [facility], you and your health needs are our priority.  As part of our continuing mission to provide you with exceptional heart care, we have created designated Provider Care Teams.  These Care Teams include your primary Cardiologist (physician) and Advanced Practice Providers (APPs -  Physician Assistants and Nurse Practitioners) who all work together to provide you with the care you need, when you need it.  We recommend signing up for the patient portal called "MyChart".  Sign up information is provided on this After Visit Summary.  MyChart is used to connect with patients for Virtual Visits (Telemedicine).  Patients are able to view lab/test results, encounter notes, upcoming appointments, etc.  Non-urgent messages can be sent to your provider as well.   To learn more about what you can do with MyChart, go to [email or web address].    Your next appointment:   6 month(s)  Provider:   You may see [name], MD or one of the following Advanced Practice Providers on your designated Care Team:   Murray Hodgkins, NP [name], PA-C Cadence Kathlen Mody, PA-C Gerrie Nordmann, NP  Other  Instructions - None

## 2022-06-21 ENCOUNTER — Ambulatory Visit: Payer: Medicare HMO | Admitting: Cardiology

## 2022-06-28 ENCOUNTER — Ambulatory Visit: Payer: Medicare HMO | Admitting: Dermatology

## 2022-06-28 VITALS — BP 128/77 | HR 76

## 2022-06-28 DIAGNOSIS — L578 Other skin changes due to chronic exposure to nonionizing radiation: Secondary | ICD-10-CM | POA: Diagnosis not present

## 2022-06-28 DIAGNOSIS — L820 Inflamed seborrheic keratosis: Secondary | ICD-10-CM | POA: Diagnosis not present

## 2022-06-28 DIAGNOSIS — L821 Other seborrheic keratosis: Secondary | ICD-10-CM

## 2022-06-28 DIAGNOSIS — Z79899 Other long term (current) drug therapy: Secondary | ICD-10-CM

## 2022-06-28 DIAGNOSIS — Z7189 Other specified counseling: Secondary | ICD-10-CM | POA: Diagnosis not present

## 2022-06-28 DIAGNOSIS — L570 Actinic keratosis: Secondary | ICD-10-CM

## 2022-06-28 NOTE — Patient Instructions (Addendum)
Cryotherapy Aftercare  Wash gently with soap and water everyday.   Apply Vaseline and Band-Aid daily until healed.     Due to recent changes in healthcare laws, you may see results of your pathology and/or laboratory studies on MyChart before the doctors have had a chance to review them. We understand that in some cases there may be results that are confusing or concerning to you. Please understand that not all results are received at the same time and often the doctors may need to interpret multiple results in order to provide you with the best plan of care or course of treatment. Therefore, we ask that you please give us 2 business days to thoroughly review all your results before contacting the office for clarification. Should we see a critical lab result, you will be contacted sooner.   If You Need Anything After Your Visit  If you have any questions or concerns for your doctor, please call our main line at [phone] and press option 4 to reach your doctor's medical assistant. If no one answers, please leave a voicemail as directed and we will return your call as soon as possible. Messages left after 4 pm will be answered the following business day.   You may also send us a message via MyChart. We typically respond to MyChart messages within 1-2 business days.  For prescription refills, please ask your pharmacy to contact our office. Our fax number is [phone].  If you have an urgent issue when the clinic is closed that cannot wait until the next business day, you can page your doctor at the number below.    Please note that while we do our best to be available for urgent issues outside of office hours, we are not available 24/7.   If you have an urgent issue and are unable to reach us, you may choose to seek medical care at your doctor's office, retail clinic, urgent care center, or emergency room.  If you have a medical emergency, please immediately call 911 or go to the  emergency department.  Pager Numbers  - Dr. Kowalski: [phone]  - Dr. Moye: [phone]  - Dr. Stewart: [phone]  In the event of inclement weather, please call our main line at [phone] for an update on the status of any delays or closures.  Dermatology Medication Tips: Please keep the boxes that topical medications come in in order to help keep track of the instructions about where and how to use these. Pharmacies typically print the medication instructions only on the boxes and not directly on the medication tubes.   If your medication is too expensive, please contact our office at [phone] option 4 or send us a message through MyChart.   We are unable to tell what your co-pay for medications will be in advance as this is different depending on your insurance coverage. However, we may be able to find a substitute medication at lower cost or fill out paperwork to get insurance to cover a needed medication.   If a prior authorization is required to get your medication covered by your insurance company, please allow us 1-2 business days to complete this process.  Drug prices often vary depending on where the prescription is filled and some pharmacies may offer cheaper prices.  The website www.goodrx.com contains coupons for medications through different pharmacies. The prices here do not account for what the cost may be with help from insurance (it may be cheaper with your insurance), but the website can   give you the price if you did not use any insurance.  - You can print the associated coupon and take it with your prescription to the pharmacy.  - You may also stop by our office during regular business hours and pick up a GoodRx coupon card.  - If you need your prescription sent electronically to a different pharmacy, notify our office through [facility] MyChart or by phone at [phone] option 4.     Si Usted Necesita Algo Despus de Su Visita  Tambin puede  enviarnos un mensaje a travs de MyChart. Por lo general respondemos a los mensajes de MyChart en el transcurso de 1 a 2 das hbiles.  Para renovar recetas, por favor pida a su farmacia que se ponga en contacto con nuestra oficina. Nuestro nmero de fax es el [phone].  Si tiene un asunto urgente cuando la clnica est cerrada y que no puede esperar hasta el siguiente da hbil, puede llamar/localizar a su doctor(a) al nmero que aparece a continuacin.   Por favor, tenga en cuenta que aunque hacemos todo lo posible para estar disponibles para asuntos urgentes fuera del horario de oficina, no estamos disponibles las 24 horas del da, los 7 das de la semana.   Si tiene un problema urgente y no puede comunicarse con nosotros, puede optar por buscar atencin mdica  en el consultorio de su doctor(a), en una clnica privada, en un centro de atencin urgente o en una sala de emergencias.  Si tiene una emergencia mdica, por favor llame inmediatamente al 911 o vaya a la sala de emergencias.  Nmeros de bper  - Dr. Kowalski: [phone]  - Dra. Moye: [phone]  - Dra. Stewart: [phone]  En caso de inclemencias del tiempo, por favor llame a nuestra lnea principal al [phone] para una actualizacin sobre el estado de cualquier retraso o cierre.  Consejos para la medicacin en dermatologa: Por favor, guarde las cajas en las que vienen los medicamentos de uso tpico para ayudarle a seguir las instrucciones sobre dnde y cmo usarlos. Las farmacias generalmente imprimen las instrucciones del medicamento slo en las cajas y no directamente en los tubos del medicamento.   Si su medicamento es muy caro, por favor, pngase en contacto con nuestra oficina llamando al [phone] y presione la opcin 4 o envenos un mensaje a travs de MyChart.   No podemos decirle cul ser su copago por los medicamentos por adelantado ya que esto es diferente dependiendo de la cobertura de su seguro.  Sin embargo, es posible que podamos encontrar un medicamento sustituto a menor costo o llenar un formulario para que el seguro cubra el medicamento que se considera necesario.   Si se requiere una autorizacin previa para que su compaa de seguros cubra su medicamento, por favor permtanos de 1 a 2 das hbiles para completar este proceso.  Los precios de los medicamentos varan con frecuencia dependiendo del lugar de dnde se surte la receta y alguna farmacias pueden ofrecer precios ms baratos.  El sitio web www.goodrx.com tiene cupones para medicamentos de diferentes farmacias. Los precios aqu no tienen en cuenta lo que podra costar con la ayuda del seguro (puede ser ms barato con su seguro), pero el sitio web puede darle el precio si no utiliz ningn seguro.  - Puede imprimir el cupn correspondiente y llevarlo con su receta a la farmacia.  - Tambin puede pasar por nuestra oficina durante el horario de atencin regular y recoger una tarjeta de cupones de GoodRx.  -   Si necesita que su receta se enve electrnicamente a una farmacia diferente, informe a nuestra oficina a travs de MyChart de [facility] o por telfono llamando al [phone] y presione la opcin 4.  

## 2022-06-28 NOTE — Progress Notes (Unsigned)
Follow-Up Visit   Subjective  Zachary Green is a 87 y.o. male who presents for the following: Actinic keratosis 2 months f/u  The patient has  lesions to be evaluated, some may be new or changing and the patient has concerns that these could be cancer.   The following portions of the chart were reviewed this encounter and updated as appropriate: medications, allergies, medical history  Review of Systems:  No other skin or systemic complaints except as noted in HPI or Assessment and Plan.  Objective  Well appearing patient in no apparent distress; mood and affect are within normal limits. A focused examination was performed of the following areas:face,scalp,neck Relevant exam findings are noted in the Assessment and Plan.  Assessment & Plan   ACTINIC KERATOSIS Exam: Erythematous thin papules/macules with gritty scale  Actinic keratoses are precancerous spots that appear secondary to cumulative UV radiation exposure/sun exposure over time. They are chronic with expected duration over 1 year. A portion of actinic keratoses will progress to squamous cell carcinoma of the skin. It is not possible to reliably predict which spots will progress to skin cancer and so treatment is recommended to prevent development of skin cancer.  Recommend daily broad spectrum sunscreen SPF 30+ to sun-exposed areas, reapply every 2 hours as needed.  Recommend staying in the shade or wearing long sleeves, sun glasses (UVA+UVB protection) and wide brim hats (4-inch brim around the entire circumference of the hat). Call for new or changing lesions.  Treatment Plan: Destruction Procedure Note Destruction method: cryotherapy   Informed consent: discussed and consent obtained   Lesion destroyed using liquid nitrogen: Yes   Outcome: patient tolerated procedure well with no complications   Post-procedure details: wound care instructions given   Locations: face,scalp,ears # of Lesions Treated: 21  Prior to  procedure, discussed risks of blister formation, small wound, skin dyspigmentation, or rare scar following cryotherapy. Recommend Vaseline ointment to treated areas while healing.   INFLAMED SEBORRHEIC KERATOSIS Exam: Erythematous keratotic or waxy stuck-on papule or plaque.  Symptomatic, irritating, patient would like treated.  Benign-appearing.  Call clinic for new or changing lesions.   Prior to procedure, discussed risks of blister formation, small wound, skin dyspigmentation, or rare scar following treatment. Recommend Vaseline ointment to treated areas while healing.  Destruction Procedure Note Destruction method: cryotherapy   Informed consent: discussed and consent obtained   Lesion destroyed using liquid nitrogen: Yes   Outcome: patient tolerated procedure well with no complications   Post-procedure details: wound care instructions given   Locations: left neck, scalp   # of Lesions Treated: 2  ACTINIC DAMAGE WITH PRECANCEROUS ACTINIC KERATOSES Counseling for Topical Chemotherapy Management: Patient exhibits: - Severe, confluent actinic changes with pre-cancerous actinic keratoses that is secondary to cumulative UV radiation exposure over time - Condition that is severe; chronic, not at goal. - diffuse scaly erythematous macules and papules with underlying dyspigmentation - Discussed Prescription "Field Treatment" topical Chemotherapy for Severe, Chronic Confluent Actinic Changes with Pre-Cancerous Actinic Keratoses  In 5 weeks return to red light with debridement  on his face   Field treatment involves treatment of an entire area of skin that has confluent Actinic Changes (Sun/ Ultraviolet light damage) and PreCancerous Actinic Keratoses by method of PhotoDynamic Therapy (PDT) and/or prescription Topical Chemotherapy agents such as 5-fluorouracil, 5-fluorouracil/calcipotriene, and/or imiquimod.  The purpose is to decrease the number of clinically evident and subclinical  PreCancerous lesions to prevent progression to development of skin cancer by chemically destroying early  precancer changes that may or may not be visible.  It has been shown to reduce the risk of developing skin cancer in the treated area. As a result of treatment, redness, scaling, crusting, and open sores may occur during treatment course. One or more than one of these methods may be used and may have to be used several times to control, suppress and eliminate the PreCancerous changes. Discussed treatment course, expected reaction, and possible side effects. - Recommend daily broad spectrum sunscreen SPF 30+ to sun-exposed areas, reapply every 2 hours as needed.  - Staying in the shade or wearing long sleeves, sun glasses (UVA+UVB protection) and wide brim hats (4-inch brim around the entire circumference of the hat) are also recommended. - Call for new or changing lesions.   SEBORRHEIC KERATOSIS - Stuck-on, waxy, tan-brown papules and/or plaques  - Benign-appearing - Discussed benign etiology and prognosis. - Observe - Call for any changes   Return in about 6 weeks (around 08/09/2022) for red light-face with debridement .  IMarye Round, CMA, am acting as scribe for Sarina Ser, MD .   Documentation: I have reviewed the above documentation for accuracy and completeness, and I agree with the above.  Sarina Ser, MD

## 2022-06-29 ENCOUNTER — Encounter: Payer: Self-pay | Admitting: Dermatology

## 2022-07-05 ENCOUNTER — Other Ambulatory Visit: Payer: Self-pay | Admitting: Dermatology

## 2022-07-14 ENCOUNTER — Other Ambulatory Visit: Payer: Self-pay | Admitting: Physician Assistant

## 2022-08-02 ENCOUNTER — Ambulatory Visit: Payer: Medicare HMO

## 2022-08-06 ENCOUNTER — Other Ambulatory Visit: Payer: Self-pay | Admitting: Cardiovascular Disease

## 2022-09-21 ENCOUNTER — Encounter (INDEPENDENT_AMBULATORY_CARE_PROVIDER_SITE_OTHER): Payer: Self-pay

## 2022-09-21 ENCOUNTER — Encounter (INDEPENDENT_AMBULATORY_CARE_PROVIDER_SITE_OTHER): Payer: Medicare HMO | Admitting: Ophthalmology

## 2022-10-06 ENCOUNTER — Other Ambulatory Visit: Payer: Self-pay | Admitting: Physician Assistant

## 2022-10-14 ENCOUNTER — Other Ambulatory Visit: Payer: Self-pay | Admitting: Physician Assistant

## 2022-10-25 ENCOUNTER — Other Ambulatory Visit: Payer: Self-pay | Admitting: Physician Assistant

## 2022-10-25 ENCOUNTER — Other Ambulatory Visit: Payer: Self-pay | Admitting: Cardiovascular Disease

## 2023-01-22 ENCOUNTER — Other Ambulatory Visit: Payer: Self-pay | Admitting: Physician Assistant

## 2023-01-22 NOTE — Telephone Encounter (Signed)
Please contact pt for future appointment. Pt due for 6 month f/u. Pt has been going to [facility] for EP needs. Just make sure pt is still with [facility] for general cardiologist needs before rescheduling.

## 2023-01-23 NOTE — Telephone Encounter (Signed)
Patient states he has changed doctors  Deleted recall

## 2023-01-23 NOTE — Telephone Encounter (Signed)
See below

## 2023-01-25 ENCOUNTER — Other Ambulatory Visit: Payer: Self-pay | Admitting: Dermatology

## 2023-02-25 ENCOUNTER — Other Ambulatory Visit: Payer: Self-pay | Admitting: Dermatology

## 2023-02-28 ENCOUNTER — Ambulatory Visit: Payer: Medicare HMO | Admitting: Urology

## 2023-02-28 ENCOUNTER — Encounter: Payer: Self-pay | Admitting: Urology

## 2023-02-28 VITALS — BP 130/74 | HR 80 | Ht 67.0 in | Wt 210.0 lb

## 2023-02-28 DIAGNOSIS — R972 Elevated prostate specific antigen [PSA]: Secondary | ICD-10-CM | POA: Diagnosis not present

## 2023-02-28 DIAGNOSIS — Z8546 Personal history of malignant neoplasm of prostate: Secondary | ICD-10-CM | POA: Diagnosis not present

## 2023-02-28 DIAGNOSIS — C61 Malignant neoplasm of prostate: Secondary | ICD-10-CM

## 2023-02-28 DIAGNOSIS — Z87448 Personal history of other diseases of urinary system: Secondary | ICD-10-CM

## 2023-02-28 LAB — BLADDER SCAN AMB NON-IMAGING: Scan Result: 0

## 2023-02-28 NOTE — Progress Notes (Signed)
I, Maysun Anabel Bene, acting as a scribe for Riki Altes, MD., have documented all relevant documentation on the behalf of Riki Altes, MD, as directed by Riki Altes, MD while in the presence of Riki Altes, MD.  02/28/2023 3:13 PM   Zachary Green January 24, 1936 [id number]  Referring provider: Marcine Matar, MD [address] [place],  [place] [address]  Chief Complaint  Patient presents with   Other   Urologic history: 1. Prostate cancer Elevated PSA 5.7 2017 with prostate biopsy performed by Dr. Evelene Croon showing Gleason 3+4 adenocarcinoma, 4/12 biopsies. All positive cores on the left side. PVP with GreenLight laser by Dr. Evelene Croon, followed by HIFU hemiablation on the left side July 2017.  PSA decreased to 1.1 post-treatment. Saw Dr. Retta Diones in [facility] July 2018 for a second opinion due to worsening lower urinary tract symptoms. Found to have a urethral stricture and underwent urethral/bladder neck dilation. Recurrent bladder neck contracture November 2018, requiring repeat urethral dilation.  TRUS/biopsy prostate November 2018 due to rising PSA and abnormal DRE. Prostate volume was 7 cc's with biopsy showing Gleason 4+3 adenocarcinoma and 9/12 biopsy cores. Bone scan/CT negative for metastatic disease.  Underwent salvage IMRT+ADT with radiation completed April 2019. PSA since treatment has been undetectable at <0.015   2. Urethral stricture/bladder contracture As above status post dilation x2.  HPI: Zachary Green is a 87 y.o. male present for follow up.   Record reviewed and summarized in the above urologic history.  Since his last visit with Dr. Retta Diones, he feels he is doing well and presently has no bothersome lower urinary tract symptoms.  Denies dysuria, gross hematuria.  No flank, abdominal, or pelvic pain.  PMH: Past Medical History:  Diagnosis Date   Actinic keratosis    Arthritis    Bladder neck contracture    BPH associated with nocturia     Chronic diastolic CHF (congestive heart failure) [facility]) cardiologist--- dr a. muhammad   a. echo 12/17: 55-60%, no RWMA, trivial AI, moderate MR with systolic bowing without prolapse, moderate TR, mild biatrial enlargement, PASP 50 mmHg   Diverticulosis of colon    DOE (dyspnea on exertion)    07-06-2020  per pt sob with chores around the house, with yard work, stairs, and long distances gets sob   ED (erectile dysfunction)    GERD (gastroesophageal reflux disease)    History of basal cell carcinoma (BCC) excision    excision's ;   09/ 2015 left upper lateral chest;  09/ 2016  right lateral top shoulder;  10/ 2017 right anterior clavical area    History of diverticulitis of colon    History of gastric polyp    followed by dr Judie Petit. Nigel Bridgeman (gi)---  previous adenomatous low grade   History of urinary retention    Hyperlipidemia    Hypertension    followed by pcp and cardiology   Light-headed feeling    07-06-2020  per pt when he goes in and out of afib get light headed   Macular degeneration, right eye    Multiple pulmonary nodules    followed by pcp   Persistent atrial fibrillation [facility]) followed by cardiology--- dr a. Jerolyn Center -- first dx 12/ 2017   a. on Eliquis, amiodarone, Toprol; b. s/p successful dccv 03/28/16  and 06-07-2020; c. CHADS2VASc at least 3 (HTN, age x 2)   Personal history of colonic adenoma 05/23/2007   04/2007 - 6 mm adenoma   Prostate cancer [facility]) urologist---  dr Retta Diones   dx 2017 ,  post HIFU ablative therapy 07/ 2017 and completed external radiation 04/ 2019   Rosacea    followed by dr d. Gwen Pounds (dermotology)   Schatzki's ring    grade 1 varices   Wears dentures    upper   Wears glasses     Surgical History: Past Surgical History:  Procedure Laterality Date   CARDIOVERSION N/A 06/07/2020   Procedure: CARDIOVERSION;  Surgeon: Iran Ouch, MD;  Location: [facility] ORS;  Service: Cardiovascular;  Laterality: N/A;   CARDIOVERSION N/A 10/03/2021   Procedure:  CARDIOVERSION;  Surgeon: Iran Ouch, MD;  Location: [facility] ORS;  Service: Cardiovascular;  Laterality: N/A;   CATARACT EXTRACTION W/ INTRAOCULAR LENS  IMPLANT, BILATERAL  2010 ;  2011   COLONOSCOPY  last one 2014  @[facility]    ELECTROPHYSIOLOGIC STUDY N/A 03/28/2016   Procedure: CARDIOVERSION;  Surgeon: Antonieta Iba, MD;  Location: [facility] ORS;  Service: Cardiovascular;  Laterality: N/A;   ESOPHAGOGASTRODUODENOSCOPY  last one 03-04-2018  @[facility]    ESOPHAGOGASTRODUODENOSCOPY (EGD) WITH PROPOFOL N/A 08/18/2016   Procedure: ESOPHAGOGASTRODUODENOSCOPY (EGD) WITH PROPOFOL;  Surgeon: Christena Deem, MD;  Location: [facility] ENDOSCOPY;  Service: Endoscopy;  Laterality: N/A;   GREEN LIGHT LASER TURP (TRANSURETHRAL RESECTION OF PROSTATE N/A 08/17/2015   Procedure: GREEN LIGHT LASER TURP (TRANSURETHRAL RESECTION OF PROSTATE;  Surgeon: Orson Ape, MD;  Location: [facility] ORS;  Service: Urology;  Laterality: N/A;   NM MYOVIEW LTD     PATELLA FRACTURE SURGERY  1984   pin placed   TONSILLECTOMY  1949   TRANSURETHRAL RESECTION OF BLADDER NECK N/A 07/12/2020   Procedure: TRANSURETHRAL RESECTION OF BLADDER NECK;  Surgeon: Marcine Matar, MD;  Location: [facility];  Service: Urology;  Laterality: N/A;   UPPER ESOPHAGEAL ENDOSCOPIC ULTRASOUND (EUS) N/A 09/21/2016   Procedure: UPPER ESOPHAGEAL ENDOSCOPIC ULTRASOUND (EUS);  Surgeon: Doren Custard, MD;  Location: [facility] ENDOSCOPY;  Service: Gastroenterology;  Laterality: N/A;    Home Medications:  Allergies as of 02/28/2023       Reactions   Codeine Nausea Only        Medication List        Accurate as of February 28, 2023  3:13 PM. If you have any questions, ask your nurse or doctor.          Diclofenac Sodium 3 % Gel Apply 1 application. topically as needed (pain).   diltiazem 240 MG 24 hr capsule Commonly known as: CARDIZEM CD Take 1 capsule (240 mg total) by mouth daily.   docusate sodium 100 MG capsule Commonly known as:  COLACE Take 100 mg by mouth 2 (two) times daily as needed (constipation).   doxycycline 50 MG capsule Commonly known as: VIBRAMYCIN TAKE 1 CAPSULE BY MOUTH DAILY WITH FOOD AND PLENTY OF FLUID   Eliquis 5 MG Tabs tablet Generic drug: apixaban TAKE 1 TABLET BY MOUTH TWICE A DAY   esomeprazole 20 MG capsule Commonly known as: NEXIUM Take 20 mg by mouth daily before breakfast.   Fish Oil 1000 MG Caps Take 1,000 mg by mouth in the morning and at bedtime.   furosemide 20 MG tablet Commonly known as: LASIX TAKE 1 TABLET BY MOUTH EVERY DAY What changed: Another medication with the same name was removed. Continue taking this medication, and follow the directions you see here. Changed by: Riki Altes   losartan 50 MG tablet Commonly known as: COZAAR TAKE 1 TABLET BY MOUTH EVERY DAY   metoprolol  succinate 25 MG 24 hr tablet Commonly known as: Toprol XL Take 1 tablet (25 mg total) by mouth daily.   metroNIDAZOLE 1 % gel Commonly known as: METROGEL Apply to face at night   mupirocin ointment 2 % Commonly known as: BACTROBAN Apply 1 application. topically as needed (rash).   permethrin 5 % cream Commonly known as: ELIMITE Apply 1 application. topically 2 (two) times daily. Applied to cheeks   pravastatin 40 MG tablet Commonly known as: PRAVACHOL TAKE ONE TABLET BY MOUTH EVERY DAY   PRESERVISION AREDS PO Take 1 tablet by mouth in the morning and at bedtime.   SYSTANE OP Place 1 drop into both eyes 2 (two) times daily as needed (scheduled in the morning & in the evening if needed for dry/irritated eyes.).   tamsulosin 0.4 MG Caps capsule Commonly known as: FLOMAX Take 1 capsule (0.4 mg total) by mouth daily after supper.   vitamin E 180 MG (400 UNITS) capsule Take 400 Units by mouth in the morning.        Allergies:  Allergies  Allergen Reactions   Codeine Nausea Only    Family History: Family History  Problem Relation Age of Onset   Alzheimer's disease  Mother    Cancer Mother        Melanoma   Diabetes Brother    Lung cancer Brother    Heart disease Brother     Social History:  reports that he quit smoking about 63 years ago. His smoking use included cigarettes. He started smoking about 66 years ago. He has a 3 pack-year smoking history. He quit smokeless tobacco use about 54 years ago.  His smokeless tobacco use included chew. He reports that he does not drink alcohol and does not use drugs.   Physical Exam: BP 130/74   Pulse 80   Ht 5\' 7"  (1.702 m)   Wt 210 lb (95.3 kg)   BMI 32.89 kg/m   Constitutional:  Alert and oriented, No acute distress. HEENT: [place] AT Respiratory: Normal respiratory effort, no increased work of breathing. Psychiatric: Normal mood and affect.   Assessment & Plan:    1. T2 unfavorable intermediate risk prostate cancer Status post IMRT+ADT completed in 2019 with undetectable PSA. PSA drawn today.  Lab visit 6 months with PSA and 1 year office visit with PSA.   2. History urethral stricture Presently no bothersome lower urinary tract symptoms. PVR today 0 mL.   I have reviewed the above documentation for accuracy and completeness, and I agree with the above.   Riki Altes, MD  [facility] [address], [address] [place], [place] [address] [phone]

## 2023-03-01 LAB — PSA: Prostate Specific Ag, Serum: <0.1 ng/mL (ref 0.0–4.0)

## 2023-03-25 ENCOUNTER — Other Ambulatory Visit: Payer: Self-pay | Admitting: Physician Assistant

## 2023-06-04 ENCOUNTER — Ambulatory Visit: Admitting: Physician Assistant

## 2023-06-04 VITALS — BP 193/86 | HR 81 | Ht 67.0 in | Wt 211.0 lb

## 2023-06-04 DIAGNOSIS — R350 Frequency of micturition: Secondary | ICD-10-CM | POA: Diagnosis not present

## 2023-06-04 DIAGNOSIS — R300 Dysuria: Secondary | ICD-10-CM | POA: Diagnosis not present

## 2023-06-04 LAB — MICROSCOPIC EXAMINATION: WBC, UA: >30 /HPF — AB (ref 0–5)

## 2023-06-04 LAB — URINALYSIS, COMPLETE
Bilirubin, UA: NEGATIVE
Glucose, UA: NEGATIVE
Ketones, UA: NEGATIVE
Nitrite, UA: NEGATIVE
Specific Gravity, UA: 1.02 (ref 1.005–1.030)
Urobilinogen, Ur: 1 mg/dL (ref 0.2–1.0)
pH, UA: 6.5 (ref 5.0–7.5)

## 2023-06-04 LAB — BLADDER SCAN AMB NON-IMAGING: Scan Result: 54

## 2023-06-04 MED ORDER — DOXYCYCLINE HYCLATE 100 MG PO CAPS: 100.0000 mg | ORAL_CAPSULE | Freq: Two times a day (BID) | ORAL | 0 refills | Status: AC

## 2023-06-04 NOTE — Progress Notes (Signed)
 06/04/2023 1:40 PM   [place] ARP [birth year] [id number]  CC: Chief Complaint  Patient presents with   Urinary Frequency   HPI: Zachary Green is a 88 y.o. male with PMH prostate cancer s/p IMRT plus ADT in 2019 with undetectable PSA and urethral stricture/bladder neck contracture s/p dilation x 2 who presents today for evaluation of possible UTI.  He is accompanied today by his daughter, who contributes to HPI.  Today he reports 4 days of urinary frequency, hesitancy, dysuria, and gross hematuria that improved with Azo.  He denies fever, chills, nausea, vomiting, testicular swelling/pain, or history of kidney stones.  In-office UA today positive for 3+ blood, 3+ protein, and 2+ leukocytes; urine microscopy with >30 WBCs/HPF, 11-30 RBCs/HPF, and many bacteria. PVR 54mL.  PMH: Past Medical History:  Diagnosis Date   Actinic keratosis    Arthritis    Bladder neck contracture    BPH associated with nocturia    Chronic diastolic CHF (congestive heart failure) [facility]) cardiologist--- dr a. muhammad   a. echo 12/17: 55-60%, no RWMA, trivial AI, moderate MR with systolic bowing without prolapse, moderate TR, mild biatrial enlargement, PASP 50 mmHg   Diverticulosis of colon    DOE (dyspnea on exertion)    07-06-2020  per pt sob with chores around the house, with yard work, stairs, and long distances gets sob   ED (erectile dysfunction)    GERD (gastroesophageal reflux disease)    History of basal cell carcinoma (BCC) excision    excision's ;   09/ 2015 left upper lateral chest;  09/ 2016  right lateral top shoulder;  10/ 2017 right anterior clavical area    History of diverticulitis of colon    History of gastric polyp    followed by dr Judie Petit. Nigel Bridgeman (gi)---  previous adenomatous low grade   History of urinary retention    Hyperlipidemia    Hypertension    followed by pcp and cardiology   Light-headed feeling    07-06-2020  per pt when he goes in and out of afib get light headed    Macular degeneration, right eye    Multiple pulmonary nodules    followed by pcp   Persistent atrial fibrillation [facility]) followed by cardiology--- dr a. Jerolyn Center -- first dx 12/ 2017   a. on Eliquis, amiodarone, Toprol; b. s/p successful dccv 03/28/16  and 06-07-2020; c. CHADS2VASc at least 3 (HTN, age x 2)   Personal history of colonic adenoma 05/23/2007   04/2007 - 6 mm adenoma   Prostate cancer [facility]) urologist--- dr Retta Diones   dx 2017 ,  post HIFU ablative therapy 07/ 2017 and completed external radiation 04/ 2019   Rosacea    followed by dr d. Gwen Pounds (dermotology)   Schatzki's ring    grade 1 varices   Wears dentures    upper   Wears glasses     Surgical History: Past Surgical History:  Procedure Laterality Date   CARDIOVERSION N/A 06/07/2020   Procedure: CARDIOVERSION;  Surgeon: Iran Ouch, MD;  Location: [facility] ORS;  Service: Cardiovascular;  Laterality: N/A;   CARDIOVERSION N/A 10/03/2021   Procedure: CARDIOVERSION;  Surgeon: Iran Ouch, MD;  Location: [facility] OR[facility];  Service: Cardiovascular;  Laterality: N/A;   CATARACT EXTRACTION W/ INTRAOCULAR LENS  IMPLANT, BILATERAL  2010 ;  2011   COLONOSCOPY  last one 2014  @[facility]    ELECTROPHYSIOLOGIC STUDY N/A 03/28/2016   Procedure: CARDIOVERSION;  Surgeon: Antonieta Iba, MD;  Location: [facility] OR[facility];  Service: Cardiovascular;  Laterality: N/A;   ESOPHAGOGASTRODUODENOSCOPY  last one 03-04-2018  @[facility]    ESOPHAGOGASTRODUODENOSCOPY (EGD) WITH PROPOFOL N/A 08/18/2016   Procedure: ESOPHAGOGASTRODUODENOSCOPY (EGD) WITH PROPOFOL;  Surgeon: Christena Deem, MD;  Location: [facility] ENDOSCOPY;  Service: Endoscopy;  Laterality: N/A;   GREEN LIGHT LASER TURP (TRANSURETHRAL RESECTION OF PROSTATE N/A 08/17/2015   Procedure: GREEN LIGHT LASER TURP (TRANSURETHRAL RESECTION OF PROSTATE;  Surgeon: Orson Ape, MD;  Location: [facility]S;  Service: Urology;  Laterality: N/A;   NM MYOVIEW LTD     PATELLA FRACTURE SURGERY  1984   pin placed    TONSILLECTOMY  1949   TRANSURETHRAL RESECTION OF BLADDER NECK N/A 07/12/2020   Procedure: TRANSURETHRAL RESECTION OF BLADDER NECK;  Surgeon: Marcine Matar, MD;  Location: [facility];  Service: Urology;  Laterality: N/A;   UPPER ESOPHAGEAL ENDOSCOPIC ULTRASOUND (EUS) N/A 09/21/2016   Procedure: UPPER ESOPHAGEAL ENDOSCOPIC ULTRASOUND (EUS);  Surgeon: Doren Custard, MD;  Location: [facility] ENDOSCOPY;  Service: Gastroenterology;  Laterality: N/A;    Home Medications:  Allergies as of 06/04/2023       Reactions   Codeine Nausea Only        Medication List        Accurate as of June 04, 2023  1:40 PM. If you have any questions, ask your nurse or doctor.          Diclofenac Sodium 3 % Gel Apply 1 application. topically as needed (pain).   diltiazem 240 MG 24 hr capsule Commonly known as: CARDIZEM CD Take 1 capsule (240 mg total) by mouth daily.   docusate sodium 100 MG capsule Commonly known as: COLACE Take 100 mg by mouth 2 (two) times daily as needed (constipation).   doxycycline 50 MG capsule Commonly known as: VIBRAMYCIN TAKE 1 CAPSULE BY MOUTH DAILY WITH FOOD AND PLENTY OF FLUID   Eliquis 5 MG Tabs tablet Generic drug: apixaban TAKE 1 TABLET BY MOUTH TWICE A DAY   esomeprazole 20 MG capsule Commonly known as: NEXIUM Take 20 mg by mouth daily before breakfast.   Fish Oil 1000 MG Caps Take 1,000 mg by mouth in the morning and at bedtime.   furosemide 20 MG tablet Commonly known as: LASIX TAKE 1 TABLET BY MOUTH EVERY DAY   losartan 100 MG tablet Commonly known as: COZAAR Take 100 mg by mouth daily. What changed: Another medication with the same name was removed. Continue taking this medication, and follow the directions you see here.   metoprolol succinate 25 MG 24 hr tablet Commonly known as: Toprol XL Take 1 tablet (25 mg total) by mouth daily.   metroNIDAZOLE 1 % gel Commonly known as: METROGEL Apply to face at night   mupirocin  ointment 2 % Commonly known as: BACTROBAN Apply 1 application. topically as needed (rash).   permethrin 5 % cream Commonly known as: ELIMITE Apply 1 application. topically 2 (two) times daily. Applied to cheeks   pravastatin 40 MG tablet Commonly known as: PRAVACHOL TAKE ONE TABLET BY MOUTH EVERY DAY   PRESERVISION AREDS PO Take 1 tablet by mouth in the morning and at bedtime.   SYSTANE OP Place 1 drop into both eyes 2 (two) times daily as needed (scheduled in the morning & in the evening if needed for dry/irritated eyes.).   tamsulosin 0.4 MG Caps capsule Commonly known as: FLOMAX Take 1 capsule (0.4 mg total) by mouth daily after supper.   vitamin E 180 MG (400 UNITS) capsule Take 400  Units by mouth in the morning.        Allergies:  Allergies  Allergen Reactions   Codeine Nausea Only    Family History: Family History  Problem Relation Age of Onset   Alzheimer's disease Mother    Cancer Mother        Melanoma   Diabetes Brother    Lung cancer Brother    Heart disease Brother     Social History:   reports that he quit smoking about 64 years ago. His smoking use included cigarettes. He started smoking about 67 years ago. He has a 3 pack-year smoking history. He quit smokeless tobacco use about 54 years ago.  His smokeless tobacco use included chew. He reports that he does not drink alcohol and does not use drugs.  Physical Exam: BP (!) 193/86   Pulse 81   Ht 5\' 7"  (1.702 m)   Wt 211 lb (95.7 kg)   BMI 33.05 kg/m   Constitutional:  Alert and oriented, no acute distress, nontoxic appearing HEENT: [place], AT Cardiovascular: No clubbing, cyanosis, or edema Respiratory: Normal respiratory effort, no increased work of breathing Skin: No rashes, bruises or suspicious lesions Neurologic: Grossly intact, no focal deficits, moving all 4 extremities Psychiatric: Normal mood and affect  Laboratory Data: Results for orders placed or performed in visit on 06/04/23   Microscopic Examination   Collection Time: 06/04/23  1:19 PM   Urine  Result Value Ref Range   WBC, UA >30 (A) 0 - 5 /hpf   RBC, Urine 11-30 (A) 0 - 2 /hpf   Epithelial Cells (non renal) 0-10 0 - 10 /hpf   Bacteria, UA Many (A) None seen/Few  Urinalysis, Complete   Collection Time: 06/04/23  1:19 PM  Result Value Ref Range   Specific Gravity, UA 1.020 1.005 - 1.030   pH, UA 6.5 5.0 - 7.5   Color, UA Yellow Yellow   Appearance Ur Cloudy (A) Clear   Leukocytes,UA 2+ (A) Negative   Protein,UA 3+ (A) Negative/Trace   Glucose, UA Negative Negative   Ketones, UA Negative Negative   RBC, UA 3+ (A) Negative   Bilirubin, UA Negative Negative   Urobilinogen, Ur 1.0 0.2 - 1.0 mg/dL   Nitrite, UA Negative Negative   Microscopic Examination See below:   Bladder Scan (Post Void Residual) in office   Collection Time: 06/04/23  1:27 PM  Result Value Ref Range   Scan Result 54 ml    Assessment & Plan:   1. Dysuria (Primary) UA appears grossly infected, though he is emptying appropriately.  With new obstructive symptoms, will treat with 2 weeks of empiric doxycycline for possible early acute prostatitis and send for culture for further evaluation.  Will plan for urine recheck in about 2 weeks to prove resolution of microscopic hematuria.  They are in agreement with this plan. - Urinalysis, Complete - Bladder Scan (Post Void Residual) in office - CULTURE, URINE COMPREHENSIVE - doxycycline (VIBRAMYCIN) 100 MG capsule; Take 1 capsule (100 mg total) by mouth 2 (two) times daily for 14 days.  Dispense: 28 capsule; Refill: 0   Return in about 2 weeks (around 06/18/2023) for Lab visit for UA.  Carman Ching, PA-C  [facility] [address], [address] [place], [place] [address] [phone]

## 2023-06-07 LAB — CULTURE, URINE COMPREHENSIVE

## 2023-06-08 ENCOUNTER — Other Ambulatory Visit: Payer: Self-pay

## 2023-06-08 ENCOUNTER — Telehealth: Payer: Self-pay

## 2023-06-08 MED ORDER — AMOXICILLIN-POT CLAVULANATE 875-125 MG PO TABS: 1.0000 | ORAL_TABLET | Freq: Two times a day (BID) | ORAL | 0 refills | Status: AC

## 2023-06-08 NOTE — Telephone Encounter (Signed)
 Pt's son called triage, pt was seen on Monday 03/10, his son called stating he's been on doxycyline for 5 days and symptoms are getting worse. blood in urine, worsen frequency , trouble passing urine, more pain and burning with urination. Culture is back. pt son states he would like pt to be seen before the weekend.   Secure chat sent to Sam.

## 2023-06-08 NOTE — Telephone Encounter (Signed)
 Pt was switched to Augmentin 875-125mg  BID x14 days. Per Sam, he doesn't need to be seen again, organism is just resistant to the doxy I prescribed . Pt's son informed voiced understanding.

## 2023-06-18 ENCOUNTER — Other Ambulatory Visit

## 2023-06-26 ENCOUNTER — Other Ambulatory Visit

## 2023-06-26 DIAGNOSIS — R300 Dysuria: Secondary | ICD-10-CM

## 2023-06-26 LAB — URINALYSIS, COMPLETE
Bilirubin, UA: NEGATIVE
Glucose, UA: NEGATIVE
Ketones, UA: NEGATIVE
Leukocytes,UA: NEGATIVE
Nitrite, UA: NEGATIVE
Protein,UA: NEGATIVE
RBC, UA: NEGATIVE
Specific Gravity, UA: 1.015 (ref 1.005–1.030)
Urobilinogen, Ur: 0.2 mg/dL (ref 0.2–1.0)
pH, UA: 6 (ref 5.0–7.5)

## 2023-06-26 LAB — MICROSCOPIC EXAMINATION

## 2023-08-28 ENCOUNTER — Other Ambulatory Visit: Payer: Self-pay | Admitting: *Deleted

## 2023-08-28 DIAGNOSIS — C61 Malignant neoplasm of prostate: Secondary | ICD-10-CM

## 2023-08-29 ENCOUNTER — Other Ambulatory Visit: Payer: Self-pay

## 2023-08-29 DIAGNOSIS — C61 Malignant neoplasm of prostate: Secondary | ICD-10-CM

## 2023-08-30 LAB — PSA: Prostate Specific Ag, Serum: <0.1 ng/mL (ref 0.0–4.0)

## 2023-09-01 ENCOUNTER — Ambulatory Visit: Payer: Self-pay | Admitting: Urology

## 2024-02-28 ENCOUNTER — Other Ambulatory Visit: Payer: Self-pay

## 2024-02-28 DIAGNOSIS — C61 Malignant neoplasm of prostate: Secondary | ICD-10-CM

## 2024-02-29 LAB — PSA: Prostate Specific Ag, Serum: <0.1 ng/mL (ref 0.0–4.0)

## 2024-03-05 ENCOUNTER — Encounter: Payer: Self-pay | Admitting: Urology

## 2024-03-05 ENCOUNTER — Ambulatory Visit: Payer: Self-pay | Admitting: Urology

## 2024-03-05 VITALS — BP 198/78 | HR 69 | Ht 67.0 in | Wt 208.0 lb

## 2024-03-05 DIAGNOSIS — C61 Malignant neoplasm of prostate: Secondary | ICD-10-CM | POA: Diagnosis not present

## 2024-03-05 NOTE — Progress Notes (Signed)
 03/05/2024 9:00 AM   Zachary Green Oct 12, 1935 [id number]  Referring provider: Alla Amis, MD [phone] [facility] [address] [facility] [place],  [place] [address]  Chief Complaint  Patient presents with   Prostate Cancer   Urologic history: 1. Prostate cancer Elevated PSA 5.7 2017 with prostate biopsy performed by Dr. Kassie showing Gleason 3+4 adenocarcinoma, 4/12 biopsies. All positive cores on the left side. PVP with GreenLight laser by Dr. Kassie, followed by HIFU hemiablation on the left side July 2017.  PSA decreased to 1.1 post-treatment. Saw Dr. Matilda in [facility] July 2018 for a second opinion due to worsening lower urinary tract symptoms. Found to have a urethral stricture and underwent urethral/bladder neck dilation. Recurrent bladder neck contracture November 2018, requiring repeat urethral dilation.  TRUS/biopsy prostate November 2018 due to rising PSA and abnormal DRE. Prostate volume was 7 cc's with biopsy showing Gleason 4+3 adenocarcinoma and 9/12 biopsy cores. Bone scan/CT negative for metastatic disease.  Underwent salvage IMRT+ADT with radiation completed April 2019. PSA since treatment has been undetectable at <0.015     2. Urethral stricture/bladder contracture As above status post dilation x2.   HPI: Zachary Green is a 88 y.o. male presents for annual follow-up.  Since last visit saw Sheppard Hones March 2025 for a UTI; urine culture positive E. Coli No complaints today Denies dysuria, gross hematuria No flank, abdominal or pelvic pain PSA 08/29/2023 and 02/28/2024 remains undetectable at < 0.1   PMH: Past Medical History:  Diagnosis Date   Actinic keratosis    Arthritis    Bladder neck contracture    BPH associated with nocturia    Chronic diastolic CHF (congestive heart failure) [facility]) cardiologist--- dr a. muhammad   a. echo 12/17: 55-60%, no RWMA, trivial AI, moderate MR with systolic bowing without prolapse, moderate TR, mild  biatrial enlargement, PASP 50 mmHg   Diverticulosis of colon    DOE (dyspnea on exertion)    07-06-2020  per pt sob with chores around the house, with yard work, stairs, and long distances gets sob   ED (erectile dysfunction)    GERD (gastroesophageal reflux disease)    History of basal cell carcinoma (BCC) excision    excision's ;   09/ 2015 left upper lateral chest;  09/ 2016  right lateral top shoulder;  10/ 2017 right anterior clavical area    History of diverticulitis of colon    History of gastric polyp    followed by dr christella. marciana (gi)---  previous adenomatous low grade   History of urinary retention    Hyperlipidemia    Hypertension    followed by pcp and cardiology   Light-headed feeling    07-06-2020  per pt when he goes in and out of afib get light headed   Macular degeneration, right eye    Multiple pulmonary nodules    followed by pcp   Persistent atrial fibrillation [facility]) followed by cardiology--- dr a. deatrice -- first dx 12/ 2017   a. on Eliquis , amiodarone , Toprol ; b. s/p successful dccv 03/28/16  and 06-07-2020; c. CHADS2VASc at least 3 (HTN, age x 2)   Personal history of colonic adenoma 05/23/2007   04/2007 - 6 mm adenoma   Prostate cancer [facility]) urologist--- dr matilda   dx 2017 ,  post HIFU ablative therapy 07/ 2017 and completed external radiation 04/ 2019   Rosacea    followed by dr d. hester (dermotology)   Schatzki's ring    grade 1 varices  Wears dentures    upper   Wears glasses     Surgical History: Past Surgical History:  Procedure Laterality Date   CARDIOVERSION N/A 06/07/2020   Procedure: CARDIOVERSION;  Surgeon: Darron Deatrice LABOR, MD;  Location: [facility];  Service: Cardiovascular;  Laterality: N/A;   CARDIOVERSION N/A 10/03/2021   Procedure: CARDIOVERSION;  Surgeon: Darron Deatrice LABOR, MD;  Location: [facility];  Service: Cardiovascular;  Laterality: N/A;   CATARACT EXTRACTION W/ INTRAOCULAR LENS  IMPLANT, BILATERAL  2010 ;  2011   COLONOSCOPY   last one 2014  @[facility]    ELECTROPHYSIOLOGIC STUDY N/A 03/28/2016   Procedure: CARDIOVERSION;  Surgeon: Evalene JINNY Lunger, MD;  Location: [facility];  Service: Cardiovascular;  Laterality: N/A;   ESOPHAGOGASTRODUODENOSCOPY  last one 03-04-2018  @[facility]    ESOPHAGOGASTRODUODENOSCOPY (EGD) WITH PROPOFOL  N/A 08/18/2016   Procedure: ESOPHAGOGASTRODUODENOSCOPY (EGD) WITH PROPOFOL ;  Surgeon: Gaylyn Gladis PENNER, MD;  Location[facility] ENDOSCOPY;  Service: Endoscopy;  Laterality: N/A;   GREEN LIGHT LASER TURP (TRANSURETHRAL RESECTION OF PROSTATE N/A 08/17/2015   Procedure: GREEN LIGHT LASER TURP (TRANSURETHRAL RESECTION OF PROSTATE;  Surgeon: Ozell JONELLE Burkes, MD;  Location: [facility];  Service: Urology;  Laterality: N/A;   NM MYOVIEW  LTD     PATELLA FRACTURE SURGERY  1984   pin placed   TONSILLECTOMY  1949   TRANSURETHRAL RESECTION OF BLADDER NECK N/A 07/12/2020   Procedure: TRANSURETHRAL RESECTION OF BLADDER NECK;  Surgeon: Matilda Senior, MD;  Location: [facility];  Service: Urology;  Laterality: N/A;   UPPER ESOPHAGEAL ENDOSCOPIC ULTRASOUND (EUS) N/A 09/21/2016   Procedure: UPPER ESOPHAGEAL ENDOSCOPIC ULTRASOUND (EUS);  Surgeon: Elta Fonda SQUIBB, MD;  Location: [facility] ENDOSCOPY;  Service: Gastroenterology;  Laterality: N/A;    Home Medications:  Allergies as of 03/05/2024       Reactions   Codeine  Nausea Only        Medication List        Accurate as of March 05, 2024  9:00 AM. If you have any questions, ask your nurse or doctor.          Diclofenac Sodium 3 % Gel Apply 1 application. topically as needed (pain).   diltiazem  240 MG 24 hr capsule Commonly known as: CARDIZEM  CD Take 1 capsule (240 mg total) by mouth daily.   docusate sodium  100 MG capsule Commonly known as: COLACE Take 100 mg by mouth 2 (two) times daily as needed (constipation).   doxycycline  50 MG capsule Commonly known as: VIBRAMYCIN  TAKE 1 CAPSULE BY MOUTH DAILY WITH FOOD AND PLENTY OF FLUID    Eliquis  5 MG Tabs tablet Generic drug: apixaban  TAKE 1 TABLET BY MOUTH TWICE A DAY   esomeprazole 20 MG capsule Commonly known as: NEXIUM Take 20 mg by mouth daily before breakfast.   Fish Oil 1000 MG Caps Take 1,000 mg by mouth in the morning and at bedtime.   furosemide  20 MG tablet Commonly known as: LASIX  TAKE 1 TABLET BY MOUTH EVERY DAY   losartan  100 MG tablet Commonly known as: COZAAR  Take 100 mg by mouth daily.   metoprolol  succinate 25 MG 24 hr tablet Commonly known as: Toprol  XL Take 1 tablet (25 mg total) by mouth daily.   metroNIDAZOLE  1 % gel Commonly known as: METROGEL  Apply to face at night   mupirocin  ointment 2 % Commonly known as: BACTROBAN  Apply 1 application. topically as needed (rash).   permethrin  5 % cream Commonly known as: ELIMITE  Apply 1 application. topically 2 (two) times daily. Applied  to cheeks   pravastatin 40 MG tablet Commonly known as: PRAVACHOL TAKE ONE TABLET BY MOUTH EVERY DAY   PRESERVISION AREDS PO Take 1 tablet by mouth in the morning and at bedtime.   SYSTANE OP Place 1 drop into both eyes 2 (two) times daily as needed (scheduled in the morning & in the evening if needed for dry/irritated eyes.).   tamsulosin  0.4 MG Caps capsule Commonly known as: FLOMAX  Take 1 capsule (0.4 mg total) by mouth daily after supper.   vitamin E 180 MG (400 UNITS) capsule Take 400 Units by mouth in the morning.        Allergies:  Allergies  Allergen Reactions   Codeine  Nausea Only    Family History: Family History  Problem Relation Age of Onset   Alzheimer's disease Mother    Cancer Mother        Melanoma   Diabetes Brother    Lung cancer Brother    Heart disease Brother     Social History:  reports that he quit smoking about 64 years ago. His smoking use included cigarettes. He started smoking about 67 years ago. He has a 3 pack-year smoking history. He quit smokeless tobacco use about 55 years ago.  His smokeless tobacco  use included chew. He reports that he does not drink alcohol and does not use drugs.   Physical Exam: BP (!) 198/78   Pulse 69   Ht 5' 7 (1.702 m)   Wt 208 lb (94.3 kg)   BMI 32.58 kg/m   Constitutional:  Alert, No acute distress. HEENT: [place] AT Respiratory: Normal respiratory effort, no increased work of breathing. Psychiatric: Normal mood and affect.   Assessment & Plan:    1. T2 unfavorable intermediate risk prostate cancer Status post IMRT+ADT completed in 2019 with undetectable PSA. PSA remains undetectable >5 years posttreatment 1 year follow-up with PSA   Zachary JAYSON Barba, MD  [facility] [address] [place], [place] [address] [phone]

## 2024-05-06 ENCOUNTER — Ambulatory Visit

## 2025-02-27 ENCOUNTER — Other Ambulatory Visit

## 2025-03-04 ENCOUNTER — Ambulatory Visit: Admitting: Urology
